# Patient Record
Sex: Female | Born: 1955 | Race: Black or African American | Hispanic: No | Marital: Single | Smoking: Never smoker
Health system: Southern US, Community
[De-identification: ages and names within clinical notes are randomized; demographics above are authoritative.]

## PROBLEM LIST (undated history)

## (undated) DIAGNOSIS — G4730 Sleep apnea, unspecified: Secondary | ICD-10-CM

## (undated) DIAGNOSIS — I10 Essential (primary) hypertension: Secondary | ICD-10-CM

## (undated) DIAGNOSIS — E785 Hyperlipidemia, unspecified: Secondary | ICD-10-CM

## (undated) DIAGNOSIS — E882 Lipomatosis, not elsewhere classified: Secondary | ICD-10-CM

## (undated) DIAGNOSIS — E049 Nontoxic goiter, unspecified: Secondary | ICD-10-CM

## (undated) DIAGNOSIS — K219 Gastro-esophageal reflux disease without esophagitis: Secondary | ICD-10-CM

## (undated) DIAGNOSIS — R011 Cardiac murmur, unspecified: Secondary | ICD-10-CM

## (undated) DIAGNOSIS — M109 Gout, unspecified: Secondary | ICD-10-CM

## (undated) DIAGNOSIS — D649 Anemia, unspecified: Secondary | ICD-10-CM

## (undated) DIAGNOSIS — N189 Chronic kidney disease, unspecified: Secondary | ICD-10-CM

## (undated) DIAGNOSIS — M1990 Unspecified osteoarthritis, unspecified site: Secondary | ICD-10-CM

## (undated) DIAGNOSIS — N186 End stage renal disease: Secondary | ICD-10-CM

## (undated) DIAGNOSIS — K900 Celiac disease: Secondary | ICD-10-CM

## (undated) DIAGNOSIS — J309 Allergic rhinitis, unspecified: Secondary | ICD-10-CM

## (undated) DIAGNOSIS — I679 Cerebrovascular disease, unspecified: Secondary | ICD-10-CM

## (undated) DIAGNOSIS — J302 Other seasonal allergic rhinitis: Secondary | ICD-10-CM

## (undated) HISTORY — PX: TONSILLECTOMY: SUR1361

## (undated) HISTORY — DX: Anemia, unspecified: D64.9

## (undated) HISTORY — DX: Gout, unspecified: M10.9

## (undated) HISTORY — DX: Unspecified osteoarthritis, unspecified site: M19.90

## (undated) HISTORY — DX: Allergic rhinitis, unspecified: J30.9

## (undated) HISTORY — DX: Cardiac murmur, unspecified: R01.1

## (undated) HISTORY — DX: Nontoxic goiter, unspecified: E04.9

## (undated) HISTORY — DX: Lipomatosis, not elsewhere classified: E88.2

## (undated) HISTORY — DX: Cerebrovascular disease, unspecified: I67.9

## (undated) HISTORY — DX: End stage renal disease: N18.6

## (undated) HISTORY — DX: Celiac disease: K90.0

---

## 1997-09-15 ENCOUNTER — Emergency Department (HOSPITAL_COMMUNITY): Admission: EM | Admit: 1997-09-15 | Discharge: 1997-09-15 | Payer: Self-pay | Admitting: Emergency Medicine

## 1997-10-12 ENCOUNTER — Encounter: Admission: RE | Admit: 1997-10-12 | Discharge: 1998-01-10 | Payer: Self-pay | Admitting: Internal Medicine

## 1998-01-06 ENCOUNTER — Encounter: Payer: Self-pay | Admitting: Endocrinology

## 1998-01-06 ENCOUNTER — Ambulatory Visit (HOSPITAL_COMMUNITY): Admission: RE | Admit: 1998-01-06 | Discharge: 1998-01-06 | Payer: Self-pay | Admitting: Endocrinology

## 1998-01-31 ENCOUNTER — Ambulatory Visit (HOSPITAL_BASED_OUTPATIENT_CLINIC_OR_DEPARTMENT_OTHER): Admission: RE | Admit: 1998-01-31 | Discharge: 1998-01-31 | Payer: Self-pay | Admitting: Orthopedic Surgery

## 1998-03-28 ENCOUNTER — Ambulatory Visit (HOSPITAL_BASED_OUTPATIENT_CLINIC_OR_DEPARTMENT_OTHER): Admission: RE | Admit: 1998-03-28 | Discharge: 1998-03-28 | Payer: Self-pay | Admitting: Orthopedic Surgery

## 1998-06-06 ENCOUNTER — Other Ambulatory Visit: Admission: RE | Admit: 1998-06-06 | Discharge: 1998-06-06 | Payer: Self-pay | Admitting: *Deleted

## 1998-09-21 ENCOUNTER — Emergency Department (HOSPITAL_COMMUNITY): Admission: EM | Admit: 1998-09-21 | Discharge: 1998-09-21 | Payer: Self-pay | Admitting: Emergency Medicine

## 1999-01-24 ENCOUNTER — Other Ambulatory Visit: Admission: RE | Admit: 1999-01-24 | Discharge: 1999-01-24 | Payer: Self-pay | Admitting: *Deleted

## 1999-01-29 ENCOUNTER — Ambulatory Visit (HOSPITAL_COMMUNITY): Admission: RE | Admit: 1999-01-29 | Discharge: 1999-01-29 | Payer: Self-pay | Admitting: *Deleted

## 1999-05-02 ENCOUNTER — Encounter (HOSPITAL_BASED_OUTPATIENT_CLINIC_OR_DEPARTMENT_OTHER): Payer: Self-pay | Admitting: Internal Medicine

## 1999-05-02 ENCOUNTER — Encounter: Admission: RE | Admit: 1999-05-02 | Discharge: 1999-07-31 | Payer: Self-pay | Admitting: Internal Medicine

## 1999-07-31 ENCOUNTER — Encounter: Admission: RE | Admit: 1999-07-31 | Discharge: 1999-10-29 | Payer: Self-pay | Admitting: *Deleted

## 1999-08-02 ENCOUNTER — Ambulatory Visit (HOSPITAL_COMMUNITY): Admission: RE | Admit: 1999-08-02 | Discharge: 1999-08-02 | Payer: Self-pay | Admitting: Podiatry

## 1999-08-02 ENCOUNTER — Encounter: Payer: Self-pay | Admitting: Podiatry

## 1999-12-17 ENCOUNTER — Encounter: Admission: RE | Admit: 1999-12-17 | Discharge: 2000-03-16 | Payer: Self-pay | Admitting: Orthopedic Surgery

## 2000-03-20 ENCOUNTER — Ambulatory Visit (HOSPITAL_COMMUNITY): Admission: RE | Admit: 2000-03-20 | Discharge: 2000-03-20 | Payer: Self-pay | Admitting: Endocrinology

## 2000-04-03 ENCOUNTER — Encounter: Payer: Self-pay | Admitting: Orthopedic Surgery

## 2000-04-03 ENCOUNTER — Ambulatory Visit (HOSPITAL_COMMUNITY): Admission: RE | Admit: 2000-04-03 | Discharge: 2000-04-03 | Payer: Self-pay | Admitting: Orthopedic Surgery

## 2000-07-01 ENCOUNTER — Ambulatory Visit (HOSPITAL_BASED_OUTPATIENT_CLINIC_OR_DEPARTMENT_OTHER): Admission: RE | Admit: 2000-07-01 | Discharge: 2000-07-01 | Payer: Self-pay | Admitting: Orthopedic Surgery

## 2000-09-03 ENCOUNTER — Other Ambulatory Visit: Admission: RE | Admit: 2000-09-03 | Discharge: 2000-09-03 | Payer: Self-pay | Admitting: *Deleted

## 2000-09-10 ENCOUNTER — Ambulatory Visit (HOSPITAL_COMMUNITY): Admission: RE | Admit: 2000-09-10 | Discharge: 2000-09-10 | Payer: Self-pay | Admitting: Endocrinology

## 2000-11-04 ENCOUNTER — Emergency Department (HOSPITAL_COMMUNITY): Admission: EM | Admit: 2000-11-04 | Discharge: 2000-11-05 | Payer: Self-pay

## 2000-12-01 ENCOUNTER — Emergency Department (HOSPITAL_COMMUNITY): Admission: EM | Admit: 2000-12-01 | Discharge: 2000-12-01 | Payer: Self-pay | Admitting: Emergency Medicine

## 2001-01-28 ENCOUNTER — Encounter: Payer: Self-pay | Admitting: Emergency Medicine

## 2001-01-28 ENCOUNTER — Inpatient Hospital Stay (HOSPITAL_COMMUNITY): Admission: EM | Admit: 2001-01-28 | Discharge: 2001-01-29 | Payer: Self-pay | Admitting: Emergency Medicine

## 2001-05-12 ENCOUNTER — Encounter (HOSPITAL_BASED_OUTPATIENT_CLINIC_OR_DEPARTMENT_OTHER): Admission: RE | Admit: 2001-05-12 | Discharge: 2001-05-15 | Payer: Self-pay | Admitting: Orthopedic Surgery

## 2001-08-17 ENCOUNTER — Encounter (HOSPITAL_BASED_OUTPATIENT_CLINIC_OR_DEPARTMENT_OTHER): Admission: RE | Admit: 2001-08-17 | Discharge: 2001-11-15 | Payer: Self-pay | Admitting: Internal Medicine

## 2001-11-10 ENCOUNTER — Other Ambulatory Visit: Admission: RE | Admit: 2001-11-10 | Discharge: 2001-11-10 | Payer: Self-pay | Admitting: *Deleted

## 2001-12-03 ENCOUNTER — Ambulatory Visit (HOSPITAL_COMMUNITY): Admission: RE | Admit: 2001-12-03 | Discharge: 2001-12-03 | Payer: Self-pay | Admitting: *Deleted

## 2002-01-12 ENCOUNTER — Encounter: Payer: Self-pay | Admitting: Emergency Medicine

## 2002-01-12 ENCOUNTER — Emergency Department (HOSPITAL_COMMUNITY): Admission: EM | Admit: 2002-01-12 | Discharge: 2002-01-12 | Payer: Self-pay | Admitting: Emergency Medicine

## 2002-02-21 HISTORY — PX: KNEE ARTHROSCOPY: SHX127

## 2002-08-12 ENCOUNTER — Encounter: Payer: Self-pay | Admitting: Orthopedic Surgery

## 2002-08-12 ENCOUNTER — Ambulatory Visit (HOSPITAL_COMMUNITY): Admission: RE | Admit: 2002-08-12 | Discharge: 2002-08-12 | Payer: Self-pay | Admitting: Orthopedic Surgery

## 2002-09-10 ENCOUNTER — Ambulatory Visit (HOSPITAL_BASED_OUTPATIENT_CLINIC_OR_DEPARTMENT_OTHER): Admission: RE | Admit: 2002-09-10 | Discharge: 2002-09-10 | Payer: Self-pay | Admitting: Orthopedic Surgery

## 2003-01-12 ENCOUNTER — Ambulatory Visit (HOSPITAL_COMMUNITY): Admission: RE | Admit: 2003-01-12 | Discharge: 2003-01-12 | Payer: Self-pay | Admitting: *Deleted

## 2003-02-15 ENCOUNTER — Ambulatory Visit: Admission: RE | Admit: 2003-02-15 | Discharge: 2003-02-15 | Payer: Self-pay | Admitting: Endocrinology

## 2003-03-01 ENCOUNTER — Ambulatory Visit: Admission: RE | Admit: 2003-03-01 | Discharge: 2003-03-01 | Payer: Self-pay | Admitting: Endocrinology

## 2003-03-03 ENCOUNTER — Encounter (HOSPITAL_BASED_OUTPATIENT_CLINIC_OR_DEPARTMENT_OTHER): Admission: RE | Admit: 2003-03-03 | Discharge: 2003-03-10 | Payer: Self-pay | Admitting: Internal Medicine

## 2003-06-14 ENCOUNTER — Emergency Department (HOSPITAL_COMMUNITY): Admission: EM | Admit: 2003-06-14 | Discharge: 2003-06-15 | Payer: Self-pay | Admitting: Emergency Medicine

## 2003-06-22 ENCOUNTER — Ambulatory Visit (HOSPITAL_COMMUNITY): Admission: RE | Admit: 2003-06-22 | Discharge: 2003-06-22 | Payer: Self-pay | Admitting: Emergency Medicine

## 2003-06-23 ENCOUNTER — Other Ambulatory Visit: Admission: RE | Admit: 2003-06-23 | Discharge: 2003-06-23 | Payer: Self-pay | Admitting: *Deleted

## 2003-09-14 ENCOUNTER — Ambulatory Visit (HOSPITAL_COMMUNITY): Admission: RE | Admit: 2003-09-14 | Discharge: 2003-09-14 | Payer: Self-pay | Admitting: Orthopedic Surgery

## 2003-09-19 ENCOUNTER — Encounter (HOSPITAL_BASED_OUTPATIENT_CLINIC_OR_DEPARTMENT_OTHER): Admission: RE | Admit: 2003-09-19 | Discharge: 2003-09-23 | Payer: Self-pay | Admitting: Internal Medicine

## 2003-10-27 ENCOUNTER — Encounter (HOSPITAL_BASED_OUTPATIENT_CLINIC_OR_DEPARTMENT_OTHER): Admission: RE | Admit: 2003-10-27 | Discharge: 2003-11-22 | Payer: Self-pay | Admitting: Internal Medicine

## 2003-11-02 ENCOUNTER — Encounter: Admission: RE | Admit: 2003-11-02 | Discharge: 2003-11-02 | Payer: Self-pay | Admitting: Internal Medicine

## 2004-01-22 HISTORY — PX: TRIGGER FINGER RELEASE: SHX641

## 2004-02-15 ENCOUNTER — Ambulatory Visit (HOSPITAL_COMMUNITY): Admission: RE | Admit: 2004-02-15 | Discharge: 2004-02-15 | Payer: Self-pay | Admitting: *Deleted

## 2004-12-11 ENCOUNTER — Ambulatory Visit (HOSPITAL_COMMUNITY): Admission: RE | Admit: 2004-12-11 | Discharge: 2004-12-11 | Payer: Self-pay | Admitting: Orthopedic Surgery

## 2004-12-11 ENCOUNTER — Ambulatory Visit (HOSPITAL_BASED_OUTPATIENT_CLINIC_OR_DEPARTMENT_OTHER): Admission: RE | Admit: 2004-12-11 | Discharge: 2004-12-11 | Payer: Self-pay | Admitting: Orthopedic Surgery

## 2005-01-31 ENCOUNTER — Ambulatory Visit (HOSPITAL_COMMUNITY): Admission: RE | Admit: 2005-01-31 | Discharge: 2005-01-31 | Payer: Self-pay | Admitting: Endocrinology

## 2005-02-27 ENCOUNTER — Ambulatory Visit (HOSPITAL_COMMUNITY): Admission: RE | Admit: 2005-02-27 | Discharge: 2005-02-27 | Payer: Self-pay | Admitting: *Deleted

## 2005-03-04 ENCOUNTER — Encounter: Admission: RE | Admit: 2005-03-04 | Discharge: 2005-06-02 | Payer: Self-pay | Admitting: Endocrinology

## 2005-05-28 IMAGING — CR DG ANKLE COMPLETE 3+V*R*
2 series · 2 of 2 positions shown · non-contrast
Comparison: none

CLINICAL DATA: Deformity.  Evaluate Charcot joint. 
 RIGHT ANKLE 
 Three views of the right ankle were obtained.  No acute bony abnormality is seen.  The ankle joint appears normal.  There is a large degenerative calcaneal spur present with degenerative changes in the midfoot. 
 IMPRESSION
 No acute abnormality.  Degenerative changes as noted above.

[view not recorded (1 of 2)]
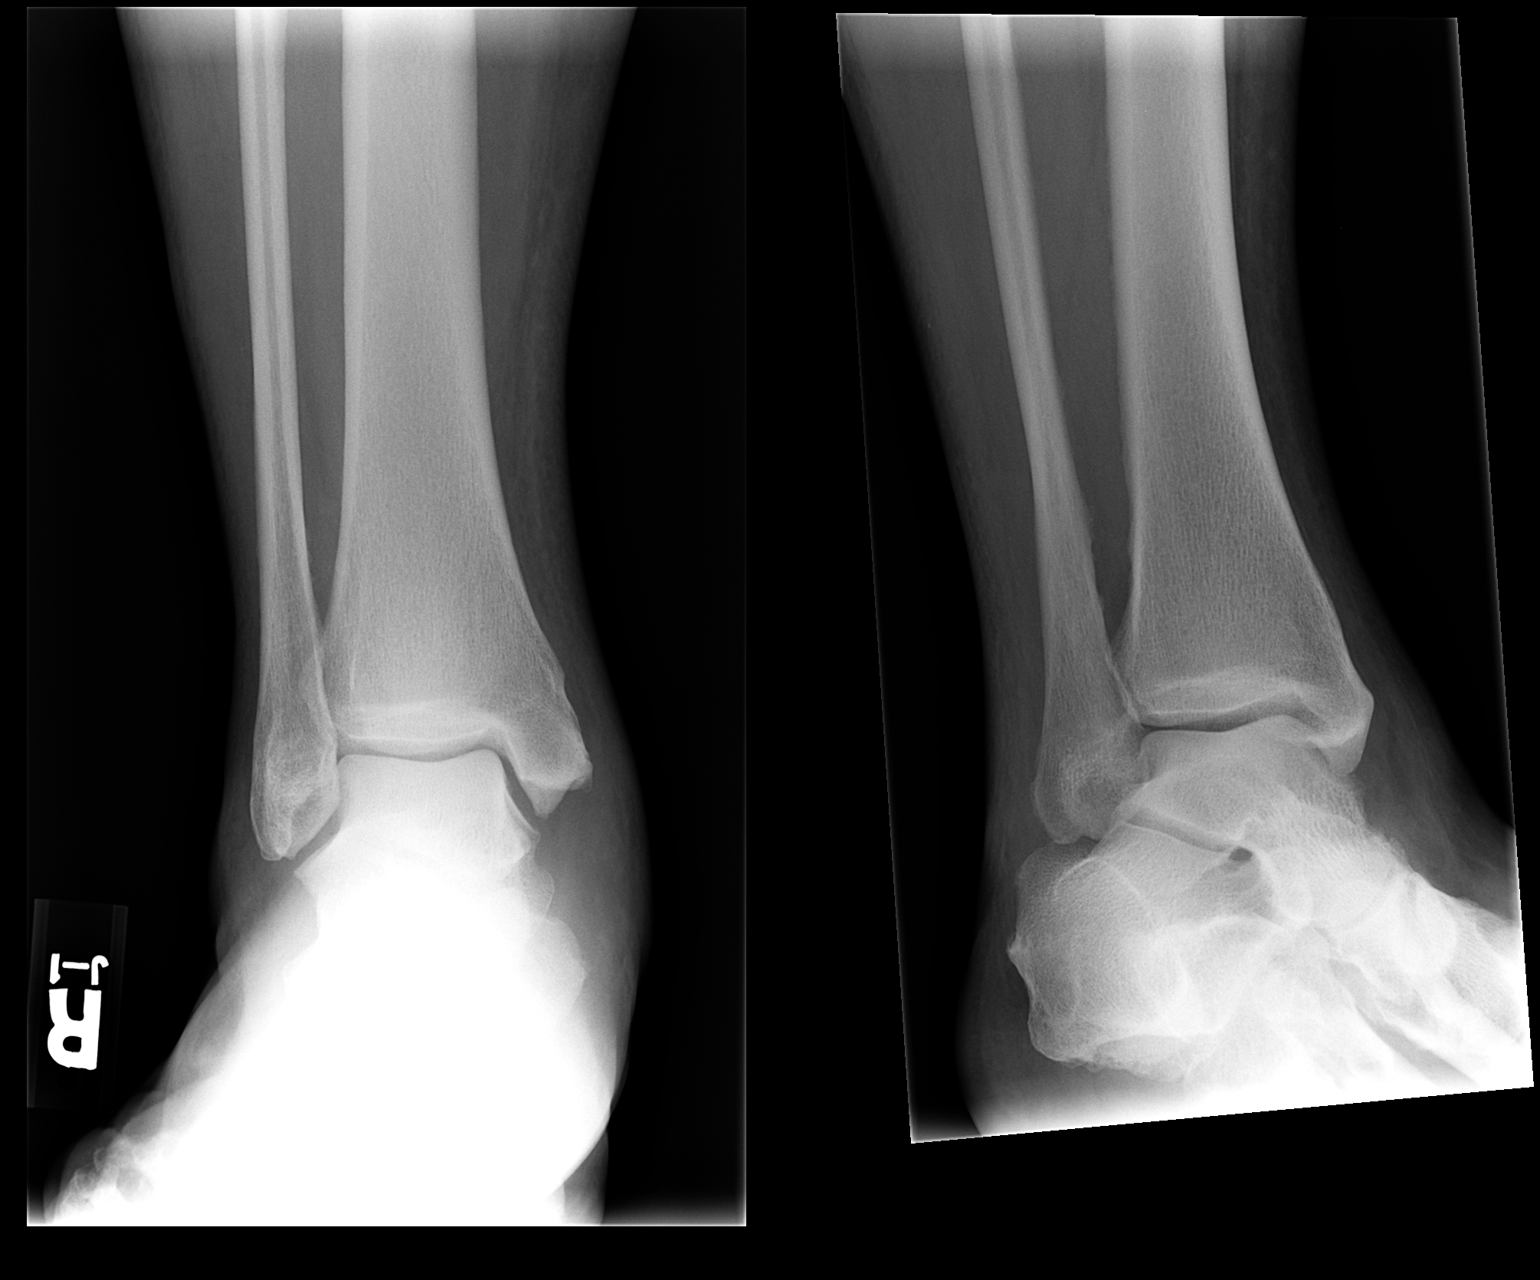

[view not recorded (2 of 2)]
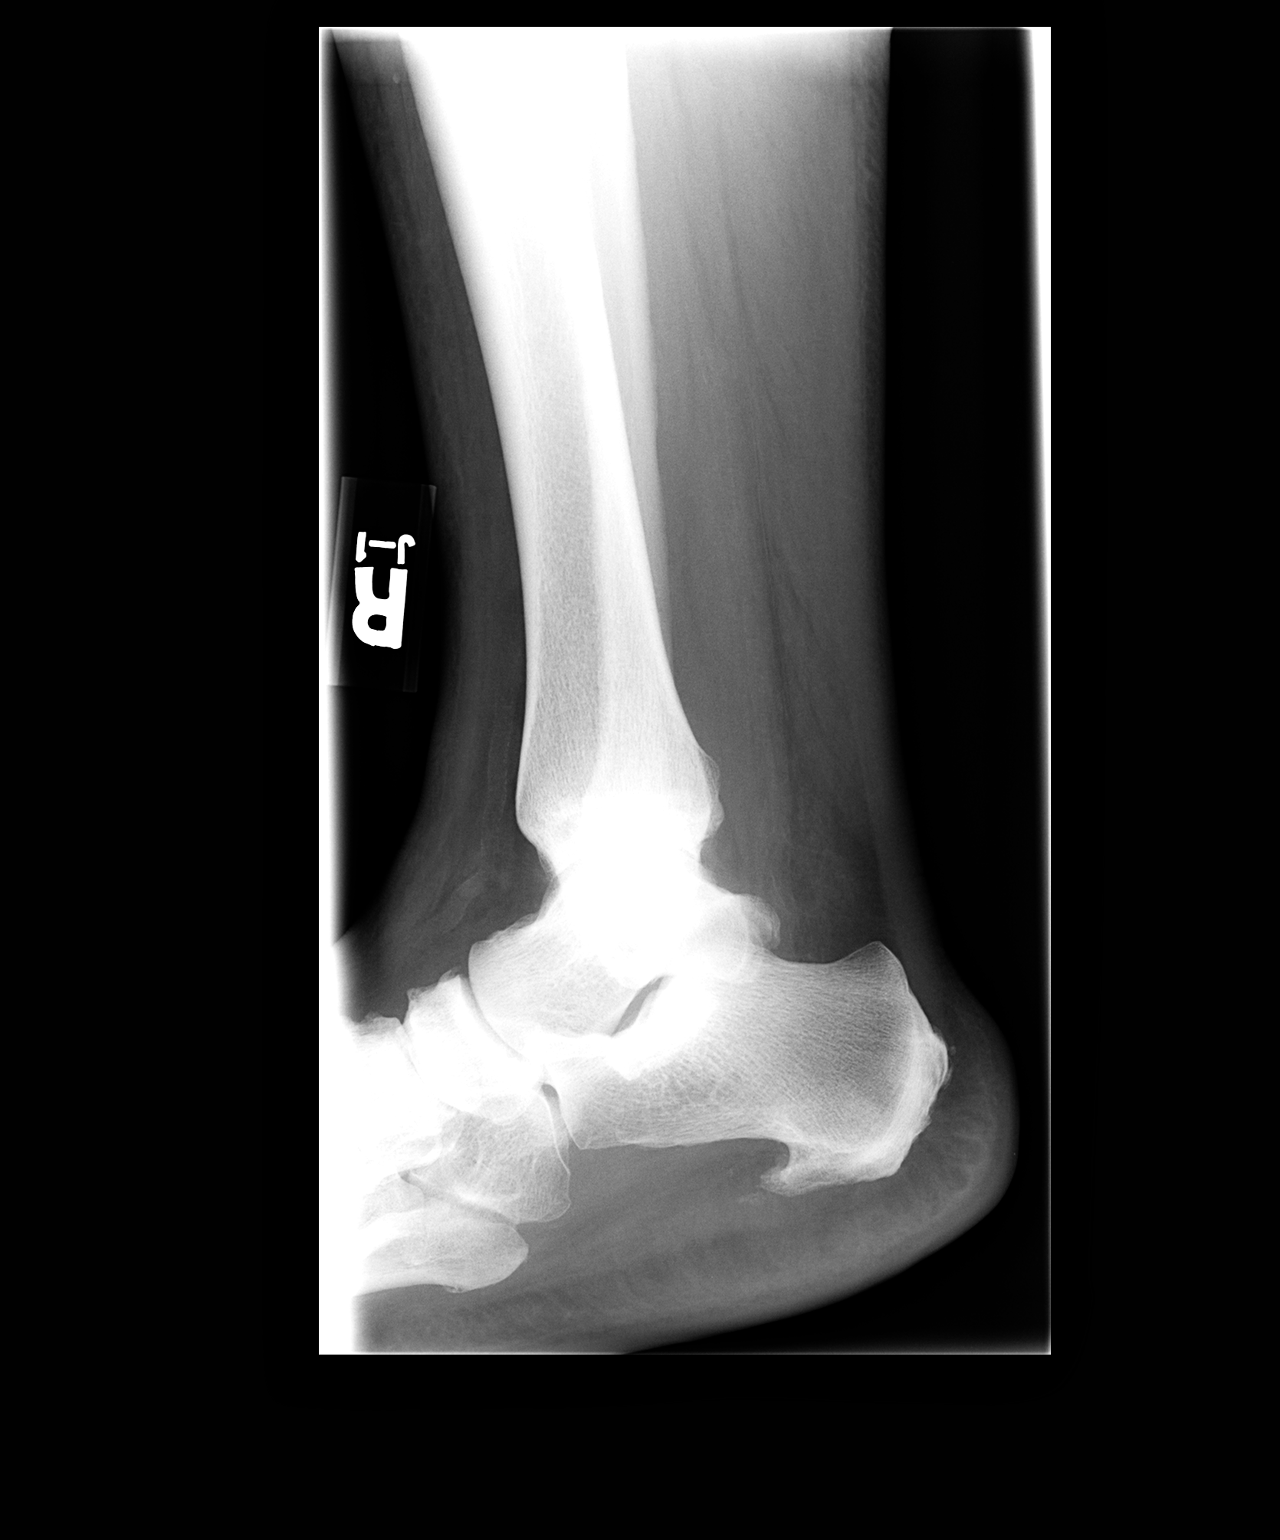

[2 of 2 positions shown; findings below may reference images not displayed]

## 2005-07-10 ENCOUNTER — Encounter: Admission: RE | Admit: 2005-07-10 | Discharge: 2005-08-07 | Payer: Self-pay | Admitting: Orthopedic Surgery

## 2006-02-28 ENCOUNTER — Ambulatory Visit (HOSPITAL_COMMUNITY): Admission: RE | Admit: 2006-02-28 | Discharge: 2006-02-28 | Payer: Self-pay | Admitting: *Deleted

## 2007-01-22 HISTORY — PX: DORSAL COMPARTMENT RELEASE: SHX1474

## 2007-03-19 ENCOUNTER — Ambulatory Visit (HOSPITAL_COMMUNITY): Admission: RE | Admit: 2007-03-19 | Discharge: 2007-03-19 | Payer: Self-pay | Admitting: Obstetrics and Gynecology

## 2007-07-28 ENCOUNTER — Ambulatory Visit (HOSPITAL_BASED_OUTPATIENT_CLINIC_OR_DEPARTMENT_OTHER): Admission: RE | Admit: 2007-07-28 | Discharge: 2007-07-28 | Payer: Self-pay | Admitting: Orthopedic Surgery

## 2008-04-12 ENCOUNTER — Ambulatory Visit (HOSPITAL_COMMUNITY): Admission: RE | Admit: 2008-04-12 | Discharge: 2008-04-12 | Payer: Self-pay | Admitting: Obstetrics

## 2009-01-18 DIAGNOSIS — I10 Essential (primary) hypertension: Secondary | ICD-10-CM | POA: Insufficient documentation

## 2009-01-18 DIAGNOSIS — D649 Anemia, unspecified: Secondary | ICD-10-CM | POA: Insufficient documentation

## 2009-05-20 ENCOUNTER — Emergency Department (HOSPITAL_COMMUNITY): Admission: EM | Admit: 2009-05-20 | Discharge: 2009-05-20 | Payer: Self-pay | Admitting: Emergency Medicine

## 2009-06-07 DIAGNOSIS — E1121 Type 2 diabetes mellitus with diabetic nephropathy: Secondary | ICD-10-CM | POA: Insufficient documentation

## 2009-12-15 ENCOUNTER — Encounter: Admission: RE | Admit: 2009-12-15 | Discharge: 2009-12-15 | Payer: Self-pay | Admitting: Orthopedic Surgery

## 2010-02-02 DIAGNOSIS — F321 Major depressive disorder, single episode, moderate: Secondary | ICD-10-CM | POA: Insufficient documentation

## 2010-06-05 NOTE — Op Note (Signed)
NAME:  Chelsey Lee, Chelsey Lee              ACCOUNT NO.:  [id number]   MEDICAL RECORD NO.:  [id number]          PATIENT TYPE:  AMB   LOCATION:  [facility]                          FACILITY:  [facility]   PHYSICIAN:  Youlanda Mighty. Sypher, M.D. DATE OF BIRTH:  1955-04-25   DATE OF PROCEDURE:  07/28/2007  DATE OF DISCHARGE:                               OPERATIVE REPORT   PREOPERATIVE DIAGNOSIS:  Chronic stenosing tenosynovitis, left first  dorsal compartment and chronic stenosing tenosynovitis, right small  finger at A1 pulley, both related to insulin-dependent diabetes.   POSTOPERATIVE DIAGNOSES:  Chronic stenosing tenosynovitis, left first  dorsal compartment and chronic stenosing tenosynovitis, right small  finger at A1 pulley, both related to insulin-dependent diabetes.   OPERATION:  1. Release of left first dorsal compartment with identification of two      slips of abductor pollicis longus and a single slip of extensor      pollicis brevis.  No septum was identified.  2. Release of left small finger A1 pulley with resection of an      incidental ganglion forming at the A1 pulley.   OPERATING SURGEON:  Youlanda Mighty. Sypher, MD.   ASSISTANT:  Leverne Humbles, PA-C.   ANESTHESIA:  General by endotracheal technique.   SUPERVISING ANESTHESIOLOGIST:  Dr. Finis Bud.   INDICATIONS:  Chelsey Lee is a 55 year old woman referred through the  courtesy of Dr. Carrolyn Meiers for evaluation of chronic stenosing  tenosynovitis.  She has a chronic insulin-dependent diabetes managed by  insulin and oral agents.  She has had a history of prior stenosing  tenosynovitis requiring release of A1 pulleys.  She has not responded to  nonoperative measures with progressive stenosing tenosynovitis of the  left first dorsal compartment and stenosing tenosynovitis of the right  small finger.   She requested surgical release of the affected tenosynovial sheaths.   After informed consent, she was brought to the operating  room at this  time.   Preoperatively, she was interviewed by Dr. Finis Bud of  anesthesia.  General anesthesia by LMA technique was recommended and  accepted by Chelsey Lee.   She was brought to the operating room at this time.   PROCEDURE:  Chelsey Lee was brought to the operating room and placed  in supine position on the operating table.   Following the induction of general anesthesia by LMA technique, there  was a concern about the security of the airway.  Therefore, Dr. Oletta Lamas  proceeded with general endotracheal technique.  Once anesthesia was  adequately achieved, the right and left arms were prepped with Betadine  soap solution and sterilely draped.  We anticipate use of an Esmarch  bandage on the right at proximal forearm level and a pneumatic  tourniquet was applied to proximal brachium.   Following exsanguination of the right arm with the Esmarch bandage, the  Esmarch was left on the proximal forearm as a tourniquet.  The procedure  commenced with placement of hand in a lead hand to allow access to the  flexor sheaths.  An oblique incision was fashioned directly over the  palpably thickened A1  pulley of the left small finger.  Subcutaneous  tissues were carefully divided taking care to identify and retract the  neurovascular structures.  The A1 pulley was isolated and found to have  a 4-mm in diameter ganglion cyst.  The cyst was removed piecemeal with a  rongeur followed by release of the pulley.  The tendons were delivered  and synovium removed with a rongeur.   Thereafter free range of motion of the small fingers recovered.   This wound was then repaired with intradermal 3-0 Prolene and Steri-  Strips.  Lidocaine 2% was infiltrated for postoperative analgesia.  The  tourniquet was released and a dressing applied with sterile gauze and  Ace wrap.  Attention was then directed to the left arm.  The left arm  was exsanguinated with an Esmarch bandage and  arterial tourniquet  inflated to 240 mmHg.  The procedure commenced with a transverse  incision directly over the palpably thickened first dorsal compartment.  Subcutaneous tissues were carefully divided taking care to identify and  gently retract the superficial sensory branches.  The first dorsal  compartment was isolated and found to be quite edematous and fibrotic.  After multiple blunt retractors were placed, the A1 pulley was split  with scalpel and scissors.  Three tendons were noted.  There were two  slips of the abductor pollicis longus and a single slip of the extensor  pollicis brevis.  I could not identify a septum.   The tendons were delivered and found to have inflammatory tenosynovium.  There was no sign of tendon damage.   The wound was then repaired with intradermal 3-0 Prolene and Steri-  Strips and a dressing of sterile gauze and a Tegaderm applied.  An Ace  wrap was applied over the wrist and proximal forearm for compression.   Lidocaine 2% was infiltrated for postop analgesia followed by release of  tourniquet.  Immediate capillary refill was noted in the fingers and  thumb.   Chelsey Lee tolerated both procedures well.  She was transferred to  recovery room with stable vital signs.      Youlanda Mighty Sypher, M.D.  Electronically Signed     RVS/MEDQ  D:  07/28/2007  T:  07/28/2007  Job:  [id number]   cc:   Annie Main A. Forde Dandy, M.D.

## 2010-06-08 NOTE — H&P (Signed)
[facility]  Patient:    Chelsey Lee, Chelsey Lee Visit Number: [id number] MRN: [id number]          Service Type: MED Location: 1E 0107 01 Attending Physician:  Nat Christen Dictated by:   Haywood Pao, M.D. Admit Date:  01/28/2001                           History and Physical  ADDENDUM TO DICTATION NUMBER [id number]  ASSESSMENT AND PLAN:  Type 2 diabetes mellitus.  We will hold her Glucophage and follow her with sliding scale for at least the next day until it has been decided whether she will undergo a contrasted study or not.  We will continue her diabetes regimen as normal. Dictated by:   Haywood Pao, M.D. Attending Physician:  Nat Christen DD:  01/28/01 TD:  01/28/01 Job: [id number]

## 2010-06-08 NOTE — Op Note (Signed)
[facility]  Patient:    Chelsey Lee, Chelsey Lee                     MRN: [id number] Proc. Date: 07/01/00 Adm. Date:  [id number] Attending:  Barbaraann Cao                           Operative Report  PREOPERATIVE DIAGNOSIS: 1. Torn medial meniscus right knee. 2. Chondromalacia patellofemoral joint. 3. Medial compartment lateral compartment arthrosis. 4. Small lateral meniscus tear.  POSTOPERATIVE DIAGNOSIS: 1. Torn medial meniscus right knee. 2. Chondromalacia patellofemoral joint. 3. Medial compartment lateral compartment arthrosis. 4. Small lateral meniscus tear.  OPERATION PERFORMED: 1. Partial medial and lateral meniscectomies. 2. Debridement chondroplasty, patellofemoral joint, medial and lateral    compartments. 3. Arthroscopic lateral release.  SURGEON:  Josephine Cables., M.D.  ANESTHESIA:  General.  INDICATIONS FOR PROCEDURE:  The patient is a 55 year old MRI proven medial meniscus tear felt to be amenable to outpatient arthroscopy.  DESCRIPTION OF PROCEDURE:  Arthroscope through inferomedial, inferolateral, superomedial portal.  Systematic inspection the knee showed the patient to have mild patellofemoral arthrosis, overhanging patella requiring release with arthroscopic cautery.  There was medial compartment arthrosis of grade 2 and 3 variety, rather generalized but very early grade 3 changes.  Complex tear of the posterior horn of the medial meniscus requiring resection of 20 to 30% of the meniscus substance.  There was actually some meniscus left posteriorly that was not torn.  The lateral meniscus showed a small fraying type tear requiring resection of 5 to 10% of the meniscus, fibrillation moderate softening of the joint requiring mild debridement although the arthritic changes were more of a softening of both sides of the joint on this side. Knee drained of clear fluid.  Portal infiltrated with Marcaine morphine  with addition of 40 mg Depo-Medrol in the joint.  Lateral release site infiltrated .  Taken to recovery in stable condition. DD:  07/01/00 TD:  07/01/00 Job: [id number]

## 2010-06-08 NOTE — H&P (Signed)
[facility]  Patient:    Chelsey Lee, Chelsey Lee Visit Number: [id number] MRN: [id number]          Service Type: MED Location: 1E 0107 01 Attending Physician:  Nat Christen Dictated by:   Haywood Pao, M.D. Admit Date:  01/28/2001   CC:         Annie Main A. Forde Dandy, M.D.   History and Physical  DATE OF BIRTH:  10/07/1955.  CHIEF COMPLAINT:  Chest pain.  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  She indicated that last night she had some pain in her chest substernally following a meal.  She tried burping and taking some Gas-X, may have helped it, but it somewhat persisted and went away on its own.  Had another episode of it tonight not immediately associated with a meal, this time radiating under her right breast.  She became concerned and thought something might be going on with her heart and walked over to the emergency room to have an evaluation.  Was given two sublingual nitroglycerin, which seemed to help the pain and noted the Gas-X did not help relieve her pain.  She indicated that she was feeling tired working, and she felt an episode of her heart racing and burning under her right breast.  The pain, she indicates, is better with sitting down.  The patient states that she does not have any cardiac history and last had a cardiac evaluation with treadmill in the mid-1990s.  Has not stated any other recent health problems at this point.  ALLERGIES:  No known drug allergies.  MEDICATIONS:  Cardura 4 mg p.o. q.d., Glucotrol XL 5 mg p.o. q.d., Xanax has been discontinued per the patient, Lotrel 5/20 mg p.o. q.d., Glucophage 500 mg one tablet with breakfast, two at lunch, two at dinner.  Prilosec has been discontinued by the patient.  NPH 35 units subcu q.a.m., and Ortho-Prefest.  PAST  MEDICAL HISTORY: 1. Diabetes mellitus.  The patient was diagnosed approximately 25 years ago. 2. Hypertension. 3. Gastroesophageal reflux disease, off her current medicines. 4. Anxiety, off her current medicines. 5. Hiatal hernia.  PAST SURGICAL HISTORY:  The patient has had a hand surgery, a partial hysterectomy in 1998, and arthroscopic surgery on her right knee in 2002.  SOCIAL HISTORY:  The patient lives in [place] with her parents.  She is a housekeeper at [employer] and is a nonsmoker, nondrinker, but a self-admitted "food-a-holic."  Denies any herbal medicine usage or any other drugs.  FAMILY HISTORY:  Her mother is alive with hypertension.  Her father is alive with hypertension and diabetes with _____.  She has one brother and one sister, both of whom are reasonably healthy.  REVIEW OF SYSTEMS:  The patient complains only of fatigue now.  She complained of chest pain earlier.  Denies fevers, chills, sweats, any shortness of breath or dyspnea on exertion.  No new cough or wheezing is noted.  No urinary symptoms and no specific GI symptoms other than some possible reflux.  All other systems are negative.  ADVANCE DIRECTIVES:  The patient is a full code.  PHYSICAL EXAMINATION:  VITAL SIGNS:  Temperature 97.0, pulse 93, respiratory rate 22, blood pressure 133/68.  Pain is 0/10 currently and saturating 98% on room air.  GENERAL:  The patient is obese, pleasant, in no acute distress.  HEENT:  Normocephalic, atraumatic.  PERRLA, EOMI.  ENT is within normal limits.  Dentition shows multiple fillings.  NECK:  Supple without lymphadenopathy, thyromegaly, or bruit.  CHEST:  Clear to auscultation bilaterally.  SKIN:  No rashes or lesions.  CARDIAC:  Regular rate and rhythm, no murmur, rub, or gallop.  Normal S1, S2. Pulses are 2+ and equal bilaterally.  ABDOMEN:  Soft, nontender, normoactive bowel sounds.  No rebound or guarding. No hepatosplenomegaly.   Positive for obesity.  EXTREMITIES:  The patient has 1+ pitting edema of the right leg, but she states it has persisted x6 months following arthroscopic knee surgery.  She has had evaluation for DVT, which has been negative, and wears stockings normally.  MUSCULOSKELETAL:  No joint deformity, effusions, or tenderness.  No thigh or CVA tenderness.  NEUROLOGIC:   Cranial nerves II-XII are intact.  The patient is oriented x3. Strength is 5/5 bilaterally.  DIAGNOSTIC STUDIES:  Chest x-ray shows mild cardiac enlargement and pulmonary venous hypertension.  No evidence of congestive heart failure.  EKG:  Rate is 88 with normal sinus rhythm and occasional PACs.  Axis is normal.  Her PR is 160, QRS 80, with a QTC of 448.  She has poor R-wave progression V1-V6, final turnover at V5.  No hypertrophy is noted.  White count 7.7, hemoglobin 10.3, BUN and creatinine 21 and 1.1, respectively, potassium 3.5, sodium 135.  Total bilirubin 0.8, AST 22, ALT 15, alkaline phosphatase 57, amylase 110, lipase 93.  CK 142, CK-MB 1.2, troponin 0.01.  ASSESSMENT AND PLAN: 1. Chest pain, will rule out myocardial infarction on telemetry with enzymes    q.6h. x2, complete for cycle.  The pain is likely GI or gastroesophageal    reflux disease-related.  I have reinitiated a proton pump inhibitor with    Protonix 40 mg p.o. q.d.  The patient is considered high-risk for heart    event given that she has had long-term diabetes and with her last    evaluation being sometime in the mid-1990s and symptomatology relieved by    nitroglycerin. 2. Gastroesophageal reflux disease.  Resume her proton pump inhibitor as    above. 3. Anxiety.  The patient has done well off her _____, will not resume at this    point. 4. Right lower extremity edema.  The patient has had prior evaluation for    deep vein thrombosis, which was negative, and states that the swelling has    been stable for the past six months and has compression  stockings.  4. Hypertension controlled on her medications. 5. Will check a lipid panel in the morning.  Her goal LDL is less than 100.    She does not know what her previous lipids were other than that they were    good. Dictated by:   Haywood Pao, M.D. Attending Physician:  Nat Christen DD:  01/28/01 TD:  01/28/01 Job: [id number]

## 2010-06-08 NOTE — Discharge Summary (Signed)
[facility]  Patient:    Chelsey Lee, Chelsey Lee Visit Number: [id number] MRN: [id number]          Service Type: MED Location: 3W [phone] Attending Physician:  Haywood Pao Dictated by:   Ishmael Holter Forde Dandy, M.D. Admit Date:  01/28/2001 Discharge Date: 01/29/2001                             Discharge Summary  DISCHARGE DIAGNOSES: 1. Chest pain with normal cardiac enzymes and normal cardiac catheterization. 2. Type 2 diabetes. 3. Hypertension. 4. Peripheral neuropathy. 5. Gastroesophageal reflux.  HOSPITAL COURSE:  Ms. Wills is a 55 year old black female, who has been under my care for several years for treatment of her type 2 diabetes and hypertension.  She has a prior history of gastroesophageal reflux and has actually been off her Prilosec for one month or so.  She presented to my partner on the 8th of January at 3:30 in the morning with chest pain and was admitted for rule out MI protocol.  Due to her multiple risk factors, cardiology consult was obtained, and the patient underwent a cardiac cath this afternoon with finding of normal coronaries and normal left ventricular function.  The patient has now completed her bed rest time, has been up without difficulty, and no local wound problems are noted in the groin.  Her blood sugars have done moderately well during her hospital stay here.  Blood pressure has been well-controlled in general.  Most recent blood sugar should be noted as 131.  The patient has had no further chest pain.  Room air oxygen saturations have been 98%, and there has been no evidence of pulmonary emboli. Her chest pain has resolved with reinstitution of her proton pump inhibitor. Blood pressure has been at target at 131/69.  She is now discharged home in improved condition with diet to be no concentrated sweets, no added salt as before.  DISCHARGE MEDICATIONS:  She is to resume her prior medications. 1. Cardura 4 mg  daily. 2. Glucotrol XL 5 daily. 3. Lotrel 5/20 daily. 4. Glucophage 500 with breakfast, 2 at lunch, and 2 at dinner.  This will    start on the 11th of January. 5. Prilosec 20 mg will be reinstituted. 6. NPH 35 units daily. 7. Ortho-Prefest will be restarted.  LABORATORY REVIEW:  White count 9700, hemoglobin 10.5, MCV 84.3, platelets 247,000, INR 1.1, PTT 27.  Sodium 135, potassium 3.5, chloride 106, CO2 22, BUN 21, creatinine 1.1, glucose 90, total bilirubin .8, alkaline phosphatase 57, SGOT 22, SGPT 15, total protein 6.8, albumin 3.2, calcium 8.5.  Amylase 110, lipase 33.  CKs were 118 and 103.  Troponins were 0.01 and less than 0.01.  Lipids showed a total cholesterol of 155, triglycerides 133, HDL 56, LDL 72, VLDL 27, total cholesterol to HDL ratio 2.8.  A chest x-ray shows cardiomegaly with questionable vascular congestion with some loss of volume in the right base.  Coronary cath is not final, but normal coronaries and normal LV function is the written note.  In summary, a 55 year old black female with cardiac risk factors including diabetes, hypertension, and obesity, who presented with chest pain and underwent cardiac cath after an MI was ruled out.  Fortunately, her coronary arteries and left ventricular function were perfectly normal, and it is felt that the source of her pain was likely GI.  Her diabetes has been stable, and she is discharged home in  improved condition with the details as noted above. Follow up will be with me as a regular scheduled appointment or if she has further chest pain, she is to call.  She is to watch her sugars carefully when she is off her Glucophage. Dictated by:   Ishmael Holter Forde Dandy, M.D. Attending Physician:  Haywood Pao DD:  01/29/98 TD:  01/30/01 Job: [id number]

## 2010-06-08 NOTE — Op Note (Signed)
   NAME:  Chelsey Lee, PEAN NO.:  [id number]   MEDICAL RECORD NO.:  [id number]                   PATIENT TYPE:  AMB   LOCATION:  [facility]                                  FACILITY:  [facility]   PHYSICIAN:  Yvette Rack., M.D.             DATE OF BIRTH:  1956/01/12   DATE OF PROCEDURE:  09/10/2002  DATE OF DISCHARGE:                                 OPERATIVE REPORT   INDICATIONS FOR PROCEDURE:  The patient is a 55 year old with MRI proven  left knee torn meniscus, thought to be amenable to outpatient arthroscopy.   PREOPERATIVE DIAGNOSES:  1. Complex tear posterior  horn medial meniscus, left knee.  2. Grade 3 chondromalacia patellofemoral joint  and medial compartment.  3. Grade 2 chondromalacia lateral femur.   POSTOPERATIVE DIAGNOSES:  1. Complex tear posterior  horn medial meniscus, left knee.  2. Grade 3 chondromalacia patellofemoral joint  and medial compartment.  3. Grade 2 chondromalacia lateral femur.   OPERATION:  1. Partial lateral and medial meniscectomy (30% to 40%).  2. Debridement chondroplasty patellofemoral joint, medial and lateral     compartments.   SURGEON:  Lockie Pares, M.D.   ANESTHESIA:  General.   DESCRIPTION OF PROCEDURE:  She was arthroscoped through inferomedial and  inferolateral portals. Systematic inspection of the knee showed a thick  medial plica with mild to moderate changes of grade 3 chondromalacia  patellofemoral joint and debrided. The lateral  compartment showed grade 2  changes on the femur by and large sparing the  tibia with no tear noted on  the lateral side.   On the medial side appeared a complex tear of the posterior  and medial  meniscus which required resection with 30% to 40% of meniscal substance with  an additional generalized more advanced grade 3 changes, more noticeable on  the probably 50% of the weightbearing surface of the femur and corresponding  surfaces on the tibia; less severe changes on  the tibia than the  femur.  These were aggressively debrided. Again the patellofemoral joint  and  lateral  compartment were debrided extensively separate from the medial  compartment.   Medial meniscectomy, the knee was drained free of fluid. The portals were  closed with nylon. The knee was infiltrated with Marcaine, morphine and an  additional 40 mg of Depo-Medrol. A light compressive dressing was applied.  The patient was taken to the recovery room in stable condition.                                                 Yvette Rack., M.D.    WDC/MEDQ  D:  09/10/2002  T:  09/11/2002  Job:  [id number]

## 2010-06-08 NOTE — Consult Note (Signed)
[facility]  Patient:    KIMANI, PING Visit Number: [id number] MRN: [id number]          Service Type: Attending:  Marcello Moores A. Mare Ferrari, M.D. Dictated by:   Joyice Faster Mare Ferrari, M.D. Proc. Date: 01/28/01   CC:         Haywood Pao, M.D.  Ishmael Holter. Forde Dandy, M.D.   Consultation Report  CHIEF COMPLAINT:  Chest pain.  HISTORY:  This 55 year old black female who is an employee here at [employer] was admitted because of chest pain and dyspnea not relieved by Gas-X, but relieved by sublingual nitroglycerin.  She does have multiple risk factors for coronary disease including diabetes mellitus insulin-dependent and high blood pressure as well as hyperlipidemia.  She began having onset of chest discomfort on the evening of January 26, 2001 while at work.  Pain was not severe and she did not seek medical attention at that time.  She rested the next day and did well but last night back at work she again had substernal chest discomfort which was worse with exertion.  It was not associated with any diaphoresis, nausea, or vomiting.  There was no radiation to the arms. The patient tried some Gas-X with no relief, but did get relief with sublingual nitroglycerin after arrival in the emergency room.  Her initial cardiac work-up shows only nonspecific T-wave changes and premature atrial beats and her initial set of cardiac enzymes are negative.  FAMILY HISTORY:  Her parents are living.  Her father has diabetes and high blood pressure.  There is no history of premature coronary disease in the family.  SOCIAL HISTORY:  She does not use any alcohol or tobacco.  She is a Secretary/administrator at [employer].  PAST MEDICAL HISTORY:  She has had a history of a hiatal hernia.  PAST SURGICAL HISTORY:  Hysterectomy in 1998.  REVIEW OF SYSTEMS:  She has not been having any constitutional symptoms except for fatigue.  She has had exertional dyspnea.  She has had chronic  edema of the right leg ever since she had a knee arthroscopy on the right knee a year ago.  GENITOURINARY:  Negative.  GASTROINTESTINAL:  She has a past history of hiatal hernia with some symptoms consistent with GERD.  Remainder of review of systems is negative in detail.  PHYSICAL EXAMINATION  VITAL SIGNS:  Blood pressure 110/60, pulse 70 and regular, respirations 16. She estimates that her weight is about 250 pounds.  GENERAL:  Large woman in no acute distress.  SKIN:  Clear.  HEENT:  Pupils are equal, round and reactive to light and accommodation. Sclerae:  Clear.  Mouth and pharynx are normal.  Tongue is normal.  NECK:  Carotids have a normal upstroke.  There are no carotid bruits.  Jugular venous pressure is normal.  Thyroid is not enlarged or tender.  No lymphadenopathy.  CHEST:  Clear to percussion and auscultation.  HEART:  Quiet precordium without murmur, gallop, rub, or click.  There is no chest wall tenderness.  ABDOMEN:  Soft, nontender.  No masses.  Liver, spleen are not enlarged.  EXTREMITIES:  She does have chronic edema of the right foot.  She has good pedal pulses bilaterally.  LABORATORIES:  Electrocardiogram shows nonspecific ST-T wave abnormalities and sinus rhythm.  Chest x-ray shows borderline cardiac enlargement.  Laboratory studies are unremarkable so far.  IMPRESSION: 1. Chest pain, rule out myocardial infarction, rule out new onset of angina    pectoris. 2. Hypertensive cardiovascular disease.  3. Insulin-dependent diabetes mellitus. 4. Exogenous obesity.  RECOMMENDATION:  Agree with the admission orders as written.  In addition, will put her on Lovenox 1 mg/kg subcutaneously b.i.d.  I have recommended that the patient undergo cardiac catheterization to establish or refute the diagnosis of coronary disease and to evaluate future therapies.  We will set up the cardiac catheterization for tomorrow with Dr. Peter Martinique.  Because of her obesity  and her sedentary lifestyle, it is unlikely that we would be able to obtain a non-invasive study which would answer the questions that need to be asked in terms of her coronary disease. Dictated by:   Joyice Faster Mare Ferrari, M.D. Attending:  Joyice Faster Mare Ferrari, M.D. DD:  01/28/01 TD:  01/28/01 Job: [id number]

## 2010-06-08 NOTE — Op Note (Signed)
NAME:  Chelsey Lee, Chelsey Lee              ACCOUNT NO.:  [id number]   MEDICAL RECORD NO.:  [id number]          PATIENT TYPE:  AMB   LOCATION:  [place]                          FACILITY:  [place]   PHYSICIAN:  Youlanda Mighty. Sypher, M.D. DATE OF BIRTH:  [birth year]   DATE OF PROCEDURE:  12/11/2004  DATE OF DISCHARGE:                                 OPERATIVE REPORT   PREOPERATIVE DIAGNOSES:  1.  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  2.  Left thumb carpometacarpal joint arthritis.   POSTOPERATIVE DIAGNOSES:  1.  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  2.  Left thumb carpometacarpal joint arthritis.   OPERATION:  1.  Release of right thumb A1 pulley.  2.  Injection of left thumb carpometacarpal joint with Depo-Medrol and 1%      lidocaine and 0.25% Marcaine, all without epinephrine.   OPERATING SURGEON:  Youlanda Mighty. Sypher, M.D.   ASSISTANT:  Leverne Humbles, PA-C.   ANESTHESIA:  General sedation and local anesthesia at metacarpal head level  of right thumb, supervising anesthesiologist Dr.  Albertina Parr.   INDICATIONS:  Chelsey Lee is a 55 year old employee at [employer], who presents for evaluation of a painful right thumb.  She has been noted have chronic stenosing synovitis due to background type 2  diabetes.  She has not responded to nonoperative measures and is not eager  to proceed with steroid injection at this time.   She requests release of her A1 pulley.   She is also noted have bilateral thumb CMC arthrosis, left worse than right.  She has had increasing pain at the base of her left thumb.  Therefore, we  recommend proceeding with injection of her left thumb CMC joint at this time  while sedated.   PROCEDURE:  Chelsey Lee was brought to the operating room and placed in  supine position on the operating table.  Following light sedation, 0.25%  Marcaine and 2% lidocaine were infiltrated at the metacarpal head level on  the  right to obtain a digital block.  When anesthesia was satisfactory, the  arm was prepped with Betadine soap and solution and sterilely draped.   The eye was exsanguinated with an Esmarch bandage and an arterial tourniquet  on the proximal brachium placed to 230 mmHg.   The procedure commenced with a short transverse incision directly over the  palpably thickened A1 pulley.  The subcutaneous tissues were carefully  divided, taking care to gently retract the radial proper digital nerve and  adipose tissues.  The A1 pulley was isolated, split with scalpel and  scissors.   The flexor tendon was delivered and found to have a rather sizable swelling.  Thereafter,full range of motion of the IP joint was recovered.   The wound was repaired with mattress suture of 5-0 nylon.   Attention directed to the left thumb.  The [facility] joint was palpated by motion,  identifying the capsule.  A 27-gauge needle was used to inject a mixture of  1 mL of Depo-Medrol 40 mg/mL and 0.5 mL of Marcaine and lidocaine.   There  were no apparent complications.   [name] tolerated the surgery and anesthesia well.  She was placed in a  compressive dressing with Ace wrap and transferred to the recovery room with  stable vital signs.      Youlanda Mighty Sypher, M.D.  Electronically Signed     RVS/MEDQ  D:  12/11/2004  T:  12/11/2004  Job:  [id number]

## 2010-06-08 NOTE — Cardiovascular Report (Signed)
[facility]  Patient:    Chelsey Lee, Chelsey Lee Visit Number: [id number] MRN: [id number]          Service Type: MED Location: 3W [phone] Attending Physician:  Haywood Pao Dictated by:   Peter M. Martinique, M.D. Proc. Date: 01/29/01 Admit Date:  01/28/2001   CC:         Annie Main A. Forde Dandy, M.D.  Thomas A. Mare Ferrari, M.D.   Cardiac Catheterization  INDICATIONS FOR PROCEDURE:  The patient is a 55 year old black female who presents with prolonged substernal chest pain.  She has a longstanding history of hypertension and diabetes.  She also has a history of morbid obesity.  ACCESS:  Via the right femoral artery using standard Seldinger technique.  EQUIPMENT:  The #6 French 4-cm right and left Judkins catheters, a #6 French pigtail catheter, a #6 French arterial sheath.  MEDICATIONS:  Local anesthesia of 1% Xylocaine.  CONTRAST:  Omnipaque, 100 cc.  HEMODYNAMIC DATA:  Aortic pressure was 140/80 with a mean of 107.  Left ventricular pressure was 133 with an EDP of 22 mmHg.  ANGIOGRAPHIC DATA: 1. The left coronary artery arises and distributes normally. 2. The left main coronary artery is normal. 3. The left anterior descending artery and its branches are normal. 4. The left circumflex coronary artery is normal. 5. The right coronary artery arises and distributes normally and is a normal    vessel.  Left ventricular angiography performed in the RAO view demonstrates normal left ventricular size with normal systolic function.  Ejection fraction is estimated at 60%.  There is no mitral regurgitation or prolapse.  FINAL INTERPRETATION: 1. Normal coronary anatomy. 2. Normal left ventricular function. Dictated by:   Peter M. Martinique, M.D. Attending Physician:  Haywood Pao DD:  01/29/01 TD:  01/29/01 Job: [id number]

## 2010-10-18 LAB — POCT I-STAT, CHEM 8
BUN: 24 — ABNORMAL HIGH
Calcium, Ion: 1.25
Chloride: 104
Creatinine, Ser: 1.2
Glucose, Bld: 145 — ABNORMAL HIGH
HCT: 37
Hemoglobin: 12.6
Potassium: 4.1
Sodium: 136
TCO2: 25

## 2011-07-08 ENCOUNTER — Other Ambulatory Visit (HOSPITAL_COMMUNITY): Payer: Self-pay | Admitting: Endocrinology

## 2011-07-08 DIAGNOSIS — Z1231 Encounter for screening mammogram for malignant neoplasm of breast: Secondary | ICD-10-CM

## 2011-07-30 ENCOUNTER — Other Ambulatory Visit: Payer: Self-pay | Admitting: Orthopedic Surgery

## 2011-07-31 ENCOUNTER — Ambulatory Visit (HOSPITAL_COMMUNITY)
Admission: RE | Admit: 2011-07-31 | Discharge: 2011-07-31 | Disposition: A | Discharge status: Home / Self Care | Payer: BC Managed Care – PPO | Source: Ambulatory Visit | Attending: Endocrinology | Admitting: Endocrinology

## 2011-07-31 ENCOUNTER — Encounter (HOSPITAL_BASED_OUTPATIENT_CLINIC_OR_DEPARTMENT_OTHER): Payer: Self-pay | Admitting: *Deleted

## 2011-07-31 ENCOUNTER — Encounter (HOSPITAL_BASED_OUTPATIENT_CLINIC_OR_DEPARTMENT_OTHER)
Admission: RE | Admit: 2011-07-31 | Discharge: 2011-07-31 | Disposition: A | Discharge status: Home / Self Care | Payer: BC Managed Care – PPO | Source: Ambulatory Visit | Attending: Orthopedic Surgery | Admitting: Orthopedic Surgery

## 2011-07-31 DIAGNOSIS — Z1231 Encounter for screening mammogram for malignant neoplasm of breast: Secondary | ICD-10-CM | POA: Insufficient documentation

## 2011-07-31 LAB — BASIC METABOLIC PANEL
CO2: 24 mEq/L (ref 19–32)
Calcium: 9.8 mg/dL (ref 8.4–10.5)
Chloride: 101 mEq/L (ref 96–112)
Creatinine, Ser: 1.33 mg/dL — ABNORMAL HIGH (ref 0.50–1.10)
Glucose, Bld: 215 mg/dL — ABNORMAL HIGH (ref 70–99)
Sodium: 135 mEq/L (ref 135–145)

## 2011-07-31 NOTE — Progress Notes (Signed)
Pt to come by for ekg-bmet Has been her for other hand surgeries Denies any sleep apnea-resp problems Diabetic

## 2011-07-31 NOTE — H&P (Signed)
Chelsey Lee is an 56 y.o. female.   Chief Complaint: c/o chronic and progressive triggering of the left thumb HPI:  Chelsey Lee is now 56 years of age.  She is right-hand dominant. She works as a Sports coach at [employer].  She is on summer break.  She has had a three week history of locking of her left thumb. We have treated her for multiple stenosing tenosynovitis predicaments including trigger fingers and stenosing tenosynovitis of her left first dorsal compartment.   She has done very well. She is followed by Chelsey Lee for insulin dependent diabetes.    Past Medical History  Diagnosis Date  . Hypertension   . Seasonal allergies   . Hyperlipemia   . Diabetes mellitus   . GERD (gastroesophageal reflux disease)   . Arthritis     Past Surgical History  Procedure Date  . Dorsal compartment release 2009    rt   . Trigger finger release 2006    rt thumb  . Knee arthroscopy 02,04    right and left    No family history on file. Social History:  reports that she has never smoked. She does not have any smokeless tobacco history on file. She reports that she does not drink alcohol or use illicit drugs.  Allergies: No Known Allergies  No prescriptions prior to admission    Results for orders placed during the hospital encounter of 08/01/11 (from the past 48 hour(s))  BASIC METABOLIC PANEL     Status: Abnormal   Collection Time   07/31/11 12:30 PM      Component Value Range Comment   Sodium 135  135 - 145 mEq/L    Potassium 4.4  3.5 - 5.1 mEq/L    Chloride 101  96 - 112 mEq/L    CO2 24  19 - 32 mEq/L    Glucose, Bld 215 (*) 70 - 99 mg/dL    BUN 25 (*) 6 - 23 mg/dL    Creatinine, Ser 1.33 (*) 0.50 - 1.10 mg/dL    Calcium 9.8  8.4 - 10.5 mg/dL    GFR calc non Af Amer 44 (*) >90 mL/min    GFR calc Af Amer 51 (*) >90 mL/min     No results found.   Pertinent items are noted in HPI.  Height 5\' 7"  (1.702 m), weight 127.007 kg (280 lb).  General appearance:  alert Head: Normocephalic, without obvious abnormality Neck: supple, symmetrical, trachea midline Resp: clear to auscultation bilaterally Cardio: regular rate and rhythm GI: normal findings: bowel sounds normal Extremities:   Inspection of her hands reveals shouldering of her left thumb CMC joint.  She has pain with CMC translation and grind on the left. She has active locking of her left thumb IP joint with flexion and extension.  She is tender over the A-1 pulley. She has full range of motion of her left fingers, right fingers, right thumb and no signs of right first dorsal compartment stenosing tenosynovitis.   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  RECOMMENDATIONS/PLAN:   She is advised to proceed with release of the left thumb A-1 pulley and injection of the left thumb CMC joint under anesthesia. She is very apprehensive and resistant to the thought of an injection in the office.    Questions regarding her predicament were invited and answered in detail.  DASNOIT,Chelsey Lee 07/31/2011, 4:08 PM    H&P documentation: 08/01/2011  -History and Physical Reviewed  -Patient has been re-examined  -No change in the plan of care  Cammie Sickle, MD

## 2011-07-31 NOTE — Progress Notes (Signed)
ekg reviewed and cleared by dr fitzgerald

## 2011-08-01 ENCOUNTER — Encounter (HOSPITAL_BASED_OUTPATIENT_CLINIC_OR_DEPARTMENT_OTHER)
Admission: RE | Disposition: A | Discharge status: Home / Self Care | Payer: Self-pay | Source: Ambulatory Visit | Attending: Orthopedic Surgery

## 2011-08-01 ENCOUNTER — Encounter (HOSPITAL_BASED_OUTPATIENT_CLINIC_OR_DEPARTMENT_OTHER): Payer: Self-pay | Admitting: *Deleted

## 2011-08-01 ENCOUNTER — Ambulatory Visit (HOSPITAL_BASED_OUTPATIENT_CLINIC_OR_DEPARTMENT_OTHER)
Admission: RE | Admit: 2011-08-01 | Discharge: 2011-08-01 | Disposition: A | Discharge status: Home / Self Care | Payer: BC Managed Care – PPO | Source: Ambulatory Visit | Attending: Orthopedic Surgery | Admitting: Orthopedic Surgery

## 2011-08-01 ENCOUNTER — Encounter (HOSPITAL_BASED_OUTPATIENT_CLINIC_OR_DEPARTMENT_OTHER): Payer: Self-pay | Admitting: Certified Registered"

## 2011-08-01 ENCOUNTER — Encounter (HOSPITAL_BASED_OUTPATIENT_CLINIC_OR_DEPARTMENT_OTHER): Payer: Self-pay | Admitting: Anesthesiology

## 2011-08-01 ENCOUNTER — Ambulatory Visit (HOSPITAL_BASED_OUTPATIENT_CLINIC_OR_DEPARTMENT_OTHER): Payer: BC Managed Care – PPO | Admitting: Anesthesiology

## 2011-08-01 DIAGNOSIS — M19049 Primary osteoarthritis, unspecified hand: Secondary | ICD-10-CM | POA: Insufficient documentation

## 2011-08-01 DIAGNOSIS — E785 Hyperlipidemia, unspecified: Secondary | ICD-10-CM | POA: Insufficient documentation

## 2011-08-01 DIAGNOSIS — E119 Type 2 diabetes mellitus without complications: Secondary | ICD-10-CM | POA: Insufficient documentation

## 2011-08-01 DIAGNOSIS — Z01812 Encounter for preprocedural laboratory examination: Secondary | ICD-10-CM | POA: Insufficient documentation

## 2011-08-01 DIAGNOSIS — I10 Essential (primary) hypertension: Secondary | ICD-10-CM | POA: Insufficient documentation

## 2011-08-01 DIAGNOSIS — M65839 Other synovitis and tenosynovitis, unspecified forearm: Secondary | ICD-10-CM | POA: Insufficient documentation

## 2011-08-01 DIAGNOSIS — K219 Gastro-esophageal reflux disease without esophagitis: Secondary | ICD-10-CM | POA: Insufficient documentation

## 2011-08-01 DIAGNOSIS — M6530 Trigger finger, unspecified finger: Secondary | ICD-10-CM | POA: Insufficient documentation

## 2011-08-01 HISTORY — DX: Hyperlipidemia, unspecified: E78.5

## 2011-08-01 HISTORY — DX: Essential (primary) hypertension: I10

## 2011-08-01 HISTORY — DX: Unspecified osteoarthritis, unspecified site: M19.90

## 2011-08-01 HISTORY — DX: Gastro-esophageal reflux disease without esophagitis: K21.9

## 2011-08-01 HISTORY — DX: Other seasonal allergic rhinitis: J30.2

## 2011-08-01 HISTORY — PX: TRIGGER FINGER RELEASE: SHX641

## 2011-08-01 HISTORY — PX: STERIOD INJECTION: SHX5046

## 2011-08-01 LAB — GLUCOSE, CAPILLARY
Glucose-Capillary: 85 mg/dL (ref 70–99)
Glucose-Capillary: 91 mg/dL (ref 70–99)

## 2011-08-01 SURGERY — RELEASE, A1 PULLEY, FOR TRIGGER FINGER
Anesthesia: Monitor Anesthesia Care | Site: Hand | Laterality: Left | Wound class: Clean

## 2011-08-01 MED ORDER — DEXAMETHASONE SODIUM PHOSPHATE 4 MG/ML IJ SOLN
INTRAMUSCULAR | Status: DC | PRN
  Administered 2011-08-01: 4 mg via INTRAVENOUS

## 2011-08-01 MED ORDER — FENTANYL CITRATE 0.05 MG/ML IJ SOLN
INTRAMUSCULAR | Status: DC | PRN
  Administered 2011-08-01: 50 ug via INTRAVENOUS
  Administered 2011-08-01: 25 ug via INTRAVENOUS

## 2011-08-01 MED ORDER — OXYCODONE HCL 5 MG PO TABS: 5.0000 mg | ORAL_TABLET | Freq: Once | ORAL | Status: DC | PRN

## 2011-08-01 MED ORDER — ONDANSETRON HCL 4 MG/2ML IJ SOLN
INTRAMUSCULAR | Status: DC | PRN
  Administered 2011-08-01: 4 mg via INTRAVENOUS

## 2011-08-01 MED ORDER — METOCLOPRAMIDE HCL 5 MG/ML IJ SOLN: 10.0000 mg | Freq: Once | INTRAMUSCULAR | Status: DC | PRN

## 2011-08-01 MED ORDER — METHYLPREDNISOLONE ACETATE 40 MG/ML IJ SUSP
INTRAMUSCULAR | Status: DC | PRN
  Administered 2011-08-01: 20 mg via INTRA_ARTICULAR

## 2011-08-01 MED ORDER — FENTANYL CITRATE 0.05 MG/ML IJ SOLN
25.0000 ug | INTRAMUSCULAR | Status: DC | PRN
  Administered 2011-08-01: 50 ug via INTRAVENOUS

## 2011-08-01 MED ORDER — LIDOCAINE HCL (CARDIAC) 20 MG/ML IV SOLN
INTRAVENOUS | Status: DC | PRN
  Administered 2011-08-01: 50 mg via INTRAVENOUS

## 2011-08-01 MED ORDER — PROPOFOL 10 MG/ML IV EMUL
INTRAVENOUS | Status: DC | PRN
  Administered 2011-08-01: 90 ug/kg/min via INTRAVENOUS

## 2011-08-01 MED ORDER — HYDROCODONE-ACETAMINOPHEN 5-325 MG PO TABS
1.0000 | ORAL_TABLET | ORAL | Status: DC | PRN
  Administered 2011-08-01: 1 via ORAL

## 2011-08-01 MED ORDER — HYDROCODONE-ACETAMINOPHEN 5-325 MG PO TABS
ORAL_TABLET | ORAL | Status: AC
Start: 2011-08-01 — End: 2011-08-11

## 2011-08-01 MED ORDER — LACTATED RINGERS IV SOLN
INTRAVENOUS | Status: DC
  Administered 2011-08-01: 08:00:00 via INTRAVENOUS

## 2011-08-01 MED ORDER — MIDAZOLAM HCL 5 MG/5ML IJ SOLN
INTRAMUSCULAR | Status: DC | PRN
  Administered 2011-08-01: 0.5 mg via INTRAVENOUS
  Administered 2011-08-01: 1 mg via INTRAVENOUS

## 2011-08-01 MED ORDER — LIDOCAINE HCL 2 % IJ SOLN
INTRAMUSCULAR | Status: DC | PRN
  Administered 2011-08-01: 2 mL

## 2011-08-01 SURGICAL SUPPLY — 36 items
BANDAGE ADHESIVE 1X3 (GAUZE/BANDAGES/DRESSINGS) ×4 IMPLANT
BLADE SURG 15 STRL LF DISP TIS (BLADE) ×1 IMPLANT
BLADE SURG 15 STRL SS (BLADE) ×2
BNDG CMPR 9X4 STRL LF SNTH (GAUZE/BANDAGES/DRESSINGS)
BNDG CMPR MD 5X2 ELC HKLP STRL (GAUZE/BANDAGES/DRESSINGS) ×1
BNDG ELASTIC 2 VLCR STRL LF (GAUZE/BANDAGES/DRESSINGS) ×2 IMPLANT
BNDG ESMARK 4X9 LF (GAUZE/BANDAGES/DRESSINGS) IMPLANT
BRUSH SCRUB EZ PLAIN DRY (MISCELLANEOUS) ×2 IMPLANT
CLOTH BEACON ORANGE TIMEOUT ST (SAFETY) ×2 IMPLANT
CORDS BIPOLAR (ELECTRODE) ×2 IMPLANT
COVER MAYO STAND STRL (DRAPES) ×2 IMPLANT
COVER TABLE BACK 60X90 (DRAPES) ×2 IMPLANT
CUFF TOURNIQUET SINGLE 18IN (TOURNIQUET CUFF) ×2 IMPLANT
DECANTER SPIKE VIAL GLASS SM (MISCELLANEOUS) IMPLANT
DRAPE EXTREMITY T 121X128X90 (DRAPE) ×2 IMPLANT
DRAPE SURG 17X23 STRL (DRAPES) ×2 IMPLANT
GAUZE SPONGE 4X4 12PLY STRL LF (GAUZE/BANDAGES/DRESSINGS) ×4 IMPLANT
GAUZE XEROFORM 1X8 LF (GAUZE/BANDAGES/DRESSINGS) ×2 IMPLANT
GLOVE BIOGEL M STRL SZ7.5 (GLOVE) ×2 IMPLANT
GLOVE ORTHO TXT STRL SZ7.5 (GLOVE) ×2 IMPLANT
GOWN PREVENTION PLUS XLARGE (GOWN DISPOSABLE) ×2 IMPLANT
GOWN STRL REIN XL XLG (GOWN DISPOSABLE) ×4 IMPLANT
NEEDLE 27GAX1X1/2 (NEEDLE) IMPLANT
PACK BASIN DAY SURGERY FS (CUSTOM PROCEDURE TRAY) ×2 IMPLANT
PAD ALCOHOL SWAB (MISCELLANEOUS) ×2 IMPLANT
PAD CAST 4YDX4 CTTN HI CHSV (CAST SUPPLIES) ×1 IMPLANT
PADDING CAST COTTON 4X4 STRL (CAST SUPPLIES) ×2
SPONGE GAUZE 4X4 12PLY (GAUZE/BANDAGES/DRESSINGS) ×2 IMPLANT
STOCKINETTE 4X48 STRL (DRAPES) ×2 IMPLANT
STRIP CLOSURE SKIN 1/2X4 (GAUZE/BANDAGES/DRESSINGS) ×1 IMPLANT
SWABSTICK POVIDONE IODINE SNGL (MISCELLANEOUS) ×6 IMPLANT
SYR 3ML LL SCALE MARK (SYRINGE) ×2 IMPLANT
SYR CONTROL 10ML LL (SYRINGE) IMPLANT
TOWEL OR 17X24 6PK STRL BLUE (TOWEL DISPOSABLE) ×2 IMPLANT
UNDERPAD 30X30 INCONTINENT (UNDERPADS AND DIAPERS) ×2 IMPLANT
WATER STERILE IRR 1000ML POUR (IV SOLUTION) IMPLANT

## 2011-08-01 NOTE — Anesthesia Preprocedure Evaluation (Addendum)
Anesthesia Evaluation  Patient identified by MRN, date of birth, ID band Patient awake    Reviewed: Allergy & Precautions, H&P , NPO status , Patient's Chart, lab work & pertinent test results, reviewed documented beta blocker date and time   Airway Mallampati: II TM Distance: >3 FB Neck ROM: full    Dental   Pulmonary neg pulmonary ROS,          Cardiovascular hypertension, On Medications and On Home Beta Blockers     Neuro/Psych negative neurological ROS  negative psych ROS   GI/Hepatic Neg liver ROS, GERD-  Medicated and Controlled,  Endo/Other  Oral Hypoglycemic AgentsMorbid obesity  Renal/GU negative Renal ROS  negative genitourinary   Musculoskeletal   Abdominal   Peds  Hematology negative hematology ROS (+)   Anesthesia Other Findings See surgeon's H&P   Reproductive/Obstetrics negative OB ROS                           Anesthesia Physical Anesthesia Plan  ASA: III  Anesthesia Plan: MAC   Post-op Pain Management:    Induction: Intravenous  Airway Management Planned: Simple Face Mask  Additional Equipment:   Intra-op Plan:   Post-operative Plan:   Informed Consent: I have reviewed the patients History and Physical, chart, labs and discussed the procedure including the risks, benefits and alternatives for the proposed anesthesia with the patient or authorized representative who has indicated his/her understanding and acceptance.   Dental Advisory Given  Plan Discussed with: CRNA and Surgeon  Anesthesia Plan Comments:        Anesthesia Quick Evaluation

## 2011-08-01 NOTE — Brief Op Note (Signed)
08/01/2011  9:24 AM  PATIENT:  Chelsey Lee  56 y.o. female  PRE-OPERATIVE DIAGNOSIS:  trigger thumb on left, cmc djd left thumb  POST-OPERATIVE DIAGNOSIS:  trigger thumb on left, cmc djd left thumb  PROCEDURE:   RELEASE TRIGGER FINGER/A-1 PULLEY (Left) thumb STEROID INJECTION (Left) thumb cmc joint  SURGEON:  Surgeon(s) and Role:    * Cammie Sickle., MD - Primary  PHYSICIAN ASSISTANT:   ASSISTANTS: Kathyrn Sheriff.A-C   ANESTHESIA:   IV sedation  EBL:  Total I/O In: 700 [I.V.:700] Out: -   BLOOD ADMINISTERED:none  DRAINS: none   LOCAL MEDICATIONS USED:  LIDOCAINE   SPECIMEN:  No Specimen  DISPOSITION OF SPECIMEN:  N/A  COUNTS:  YES  TOURNIQUET:  * Missing tourniquet times found for documented tourniquets in log:  [id number] *  DICTATION: .Other Dictation: Dictation Number [id number]  PLAN OF CARE: Discharge to home after PACU  PATIENT DISPOSITION:  PACU - hemodynamically stable.

## 2011-08-01 NOTE — Op Note (Signed)
[facility]  SALISA, BANAHAN              ACCOUNT NO.:  [id number]  MEDICAL RECORD NO.:  [id number]  LOCATION:                                 FACILITY:  PHYSICIAN:  Youlanda Mighty. Sewell Pitner, M.D. DATE OF BIRTH:  [birth year]  DATE OF PROCEDURE:  08/01/2011 DATE OF DISCHARGE:                              OPERATIVE REPORT   PREOPERATIVE DIAGNOSES: 1. 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 2. Chronic left thumb carpometacarpal arthritis.  POSTOPERATIVE DIAGNOSES: 1. 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 2. Chronic left thumb carpometacarpal arthritis.  OPERATION: 1. Release of left thumb A1 pulley. 2. Injection of 20 mg of Depo-Medrol and 0.5 mL of 2% lidocaine     without epinephrine into the left thumb CMC joint from an anterior     approach.  OPERATING SURGEON:  Youlanda Mighty. Oluwanifemi Petitti, M.D.  ASSISTANT:  Marily Lente. Dasnoit, PA-C.  ANESTHESIA:  A 2% lidocaine field block and flexor sheath block of left thumb, supplemented by IV sedation.  SUPERVISING ANESTHESIOLOGIST:  Jessy Oto. Albertina Parr, M.D.  INDICATIONS:  Chelsey Lee is a 56 year old woman with insulin- dependent diabetes.  She has been a long-term patient of Dr. Reynold Bowen.  We have managed a number of diabetes-and tendonitis-related disorders with Ms. Zaya over the years.  She has had a chronic left trigger thumb and pain at the base of her left thumb.  Plain x-rays have revealed significant CMC arthritis.  Due to a failure to respond to nonoperative measures, she is brought to the operating at this time for release of her left thumb A1 pulley and injection of her left thumb carpometacarpal joint.  After informed consent, she is brought to the operating room at this time.  PROCEDURE:  Dawnel Biven was brought to room 1 of the [facility] and placed in supine position on the operating table.  Following a detailed anesthesia informed consent by Dr.  Albertina Parr, IV sedation and local was recommended and accepted.  In room 1 under Dr. Roslynn Amble direct supervision, IV sedation was provided followed by Betadine prep of the hand and wrist.  Following routine presurgical time-out, we proceeded to inject the left thumb CMC joint with a maneuver of 10 pounds traction, palpation of the carpometacarpal joint line and after Betadine prep, direct injection into the capsule of the [facility] joint with a total volume of 0.5 mL of Depo- Medrol 40 mg/mL and 0.5 mL of 2% lidocaine without epinephrine.  Good joint distention was achieved.  The hand and arm were then prepped with Betadine soap and solution and sterilely draped.  A pneumatic tourniquet on proximal brachium was set at 240 mmHg.  The left hand and arm were exsanguinated with an Esmarch bandage and the arterial tourniquet on the proximal left brachium was inflated to 240 mmHg.  After a reminder time-out for the thumb surgery, we proceeded with a short transverse incision at the proximal thumb flexion crease directly over the palpably thickened A1 pulley.  Ms. Lifton had significant fibrosis of the subcutaneous tissues.  This is a finding we often see in the presence of long-term diabetes.  The A1 pulley was isolated with the  use of scissors and Freer dissection followed by placement of 4 Ragnell retractors.  Ms. Bibbee had an unusually long A1 pulley that extended over approximately 12 mm well into the thenar eminence.  We meticulously retracted the neurovascular structures with a Ragnell retractor and released the entire A1 pulley. Thereafter, she demonstrated active range of motion of her thumb, which was full.  The wound was inspected for bleeding points and subsequently repaired with intradermal 3-0 Prolene suture.  A Steri-Strip was applied, followed by a compressive dressing of sterile gauze and Ace wrap.  For aftercare, we have encouraged Ms. Glander to begin immediate  active range of motion exercises.  We have also noted in her preop lab that her hemoglobin was 10.6.  We notified Ms. Delbert and her mother that she had a low hemoglobin and the nursing staff will provide a copy of her lab studies to provide for Dr. Forde Dandy, her primary care physician.  There were no apparent complications.     Youlanda Mighty Miraya Cudney, M.D.     RVS/MEDQ  D:  08/01/2011  T:  08/01/2011  Job:  [id number]

## 2011-08-01 NOTE — Anesthesia Postprocedure Evaluation (Signed)
Anesthesia Post Note  Patient: Chelsey Lee  Procedure(s) Performed: Procedure(s) (LRB): RELEASE TRIGGER FINGER/A-1 PULLEY (Left) STEROID INJECTION (Left)  Anesthesia type: MAC  Patient location: PACU  Post pain: Pain level controlled  Post assessment: Patient's Cardiovascular Status Stable  Last Vitals:  Filed Vitals:   08/01/11 1000  BP: 130/70  Pulse: 72  Temp:   Resp: 14    Post vital signs: Reviewed and stable  Level of consciousness: alert  Complications: No apparent anesthesia complications

## 2011-08-01 NOTE — Transfer of Care (Signed)
Immediate Anesthesia Transfer of Care Note  Patient: Chelsey Lee  Procedure(s) Performed: Procedure(s) (LRB): RELEASE TRIGGER FINGER/A-1 PULLEY (Left) STEROID INJECTION (Left)  Patient Location: PACU  Anesthesia Type: General  Level of Consciousness: awake, alert , oriented and patient cooperative  Airway & Oxygen Therapy: Patient Spontanous Breathing and Patient connected to face mask oxygen  Post-op Assessment: Report given to PACU RN and Post -op Vital signs reviewed and stable  Post vital signs: Reviewed and stable  Complications: No apparent anesthesia complications

## 2011-08-01 NOTE — Progress Notes (Signed)
Dr. Girard Cooter notified of glucose of 85. D5W 211ml bag added to iv infuse 100cc as ordered by Dr. Girard Cooter.

## 2011-08-01 NOTE — Op Note (Signed)
[id number] 

## 2011-08-01 NOTE — Anesthesia Procedure Notes (Addendum)
Date/Time: 08/01/2011 8:58 AM Performed by: Denny Levy   Procedure Name: MAC Date/Time: 08/01/2011 8:58 AM Performed by: Denny Levy Pre-anesthesia Checklist: Patient identified, Timeout performed, Emergency Drugs available, Suction available and Patient being monitored Patient Re-evaluated:Patient Re-evaluated prior to inductionOxygen Delivery Method: Simple face mask Placement Confirmation: positive ETCO2

## 2011-08-02 ENCOUNTER — Encounter (HOSPITAL_BASED_OUTPATIENT_CLINIC_OR_DEPARTMENT_OTHER): Payer: Self-pay | Admitting: Orthopedic Surgery

## 2011-08-02 LAB — POCT HEMOGLOBIN-HEMACUE: Hemoglobin: 10.6 g/dL — ABNORMAL LOW (ref 12.0–15.0)

## 2012-01-09 ENCOUNTER — Encounter (HOSPITAL_COMMUNITY): Payer: Self-pay | Admitting: Pharmacy Technician

## 2012-01-13 ENCOUNTER — Ambulatory Visit (HOSPITAL_COMMUNITY)
Admission: RE | Admit: 2012-01-13 | Discharge: 2012-01-13 | Disposition: A | Discharge status: Home / Self Care | Payer: BC Managed Care – PPO | Source: Ambulatory Visit | Attending: Ophthalmology | Admitting: Ophthalmology

## 2012-01-13 ENCOUNTER — Encounter (HOSPITAL_COMMUNITY)
Admission: RE | Disposition: A | Discharge status: Home / Self Care | Payer: Self-pay | Source: Ambulatory Visit | Attending: Ophthalmology

## 2012-01-13 ENCOUNTER — Other Ambulatory Visit: Payer: Self-pay | Admitting: Ophthalmology

## 2012-01-13 DIAGNOSIS — H2640 Unspecified secondary cataract: Secondary | ICD-10-CM | POA: Insufficient documentation

## 2012-01-13 HISTORY — PX: YAG LASER APPLICATION: SHX6189

## 2012-01-13 HISTORY — PX: CAPSULOTOMY: SHX5412

## 2012-01-13 SURGERY — MINOR CAPSULOTOMY
Anesthesia: LOCAL | Site: Eye | Laterality: Left

## 2012-01-13 MED ORDER — APRACLONIDINE HCL 0.5 % OP SOLN: 1.0000 [drp] | Freq: Once | OPHTHALMIC | Status: DC

## 2012-01-13 MED ORDER — TROPICAMIDE 1 % OP SOLN
OPHTHALMIC | Status: AC
  Administered 2012-01-13: 1 [drp]
  Filled 2012-01-13: qty 3

## 2012-01-13 MED ORDER — TROPICAMIDE 1 % OP SOLN
2.0000 [drp] | OPHTHALMIC | Status: DC | PRN
  Administered 2012-01-13 (×2): 1 [drp] via OPHTHALMIC

## 2012-01-13 MED ORDER — APRACLONIDINE HCL 0.5 % OP SOLN
OPHTHALMIC | Status: AC
  Filled 2012-01-13: qty 5

## 2012-01-13 MED ORDER — APRACLONIDINE HCL 0.5 % OP SOLN
1.0000 [drp] | Freq: Once | OPHTHALMIC | Status: AC
  Administered 2012-01-13 (×2): 1 [drp] via OPHTHALMIC

## 2012-01-13 SURGICAL SUPPLY — 28 items
APPLICATOR COTTON TIP 6IN STRL (MISCELLANEOUS) ×2 IMPLANT
BAG FLD CLT MN 6.25X3.5 (WOUND CARE)
BAG MINI COLL DRAIN (WOUND CARE) IMPLANT
BLADE KERATOME 2.75 (BLADE) ×2 IMPLANT
BLADE MINI RND TIP GREEN BEAV (BLADE) IMPLANT
CLOTH BEACON ORANGE TIMEOUT ST (SAFETY) ×2 IMPLANT
CORDS BIPOLAR (ELECTRODE) ×2 IMPLANT
DRAPE OPHTHALMIC 40X48 W POUCH (DRAPES) ×2 IMPLANT
DRAPE RETRACTOR (MISCELLANEOUS) ×2 IMPLANT
GLOVE ECLIPSE 7.0 STRL STRAW (GLOVE) ×2 IMPLANT
GOWN STRL NON-REIN LRG LVL3 (GOWN DISPOSABLE) ×4 IMPLANT
KIT BASIN OR (CUSTOM PROCEDURE TRAY) ×2 IMPLANT
KIT ROOM TURNOVER OR (KITS) ×2 IMPLANT
KNIFE CRESCENT 2.5 55 ANG (BLADE) ×2 IMPLANT
MARKER SKIN DUAL TIP RULER LAB (MISCELLANEOUS) IMPLANT
NS IRRIG 1000ML POUR BTL (IV SOLUTION) ×2 IMPLANT
PACK CATARACT CUSTOM (CUSTOM PROCEDURE TRAY) ×2 IMPLANT
PACK CATARACT MCHSCP (PACKS) IMPLANT
PAD ARMBOARD 7.5X6 YLW CONV (MISCELLANEOUS) ×4 IMPLANT
PROBE ANTERIOR 20G W/INFUS NDL (MISCELLANEOUS) IMPLANT
SPEAR EYE SURG WECK-CEL (MISCELLANEOUS) IMPLANT
SUT ETHILON 10 0 CS140 6 (SUTURE) IMPLANT
SUT VICRYL 8 0 TG140 8 (SUTURE) IMPLANT
SYR 3ML LL SCALE MARK (SYRINGE) IMPLANT
TIP SILICONE STR (MISCELLANEOUS)
TIP SILICONE STR 0.3MM UFLOW (MISCELLANEOUS) IMPLANT
TOWEL OR 17X24 6PK STRL BLUE (TOWEL DISPOSABLE) ×4 IMPLANT
WATER STERILE IRR 1000ML POUR (IV SOLUTION) ×2 IMPLANT

## 2012-01-13 NOTE — Progress Notes (Signed)
Pt. Walked to Endoscopy fr. [facility] .  Tolerated procedure well.  Procedure started at 1240, completed [id number], pt. Left procedure room at 1250 /w Mother.

## 2012-01-13 NOTE — Brief Op Note (Addendum)
01/13/2012  11:24 AM  PATIENT:  Chelsey Lee  56 y.o. female  PRE-OPERATIVE DIAGNOSIS:  OPAQUE POSTERIOR CAPSULE  POST-OPERATIVE DIAGNOSIS:  Same  PROCEDURE:  Procedure(s) (LRB) with comments: MINOR CAPSULOTOMY (Left) YAG LASER APPLICATION (Left)  Total Energy            117 Total Bursts               40 Shots/Burst                  1 Energy/Shot               3.2  SURGEON:  Surgeon(s) and Role:    Myrtha Mantis., MD - Primary  PHYSICIAN ASSISTANT:   ASSISTANTS: none   ANESTHESIA:   none  EBL:     BLOOD ADMINISTERED:none  DRAINS: none   LOCAL MEDICATIONS USED:  NONE  SPECIMEN:  No Specimen  DISPOSITION OF SPECIMEN:  N/A  COUNTS:  YES  TOURNIQUET:  * No tourniquets in log *  DICTATION: .Other Dictation: Dictation Number [id number]  PLAN OF CARE: Discharge to home after PACU  PATIENT DISPOSITION:  Short Stay   Delay start of Pharmacological VTE agent (>24hrs) due to surgical blood loss or risk of bleeding: not applicable

## 2012-01-13 NOTE — H&P (Signed)
56 yo female has had cataract surgery os.  Has developed cloudy posterior capsule os which is causing her to have blurred vision.  Admitted for yag laser capsulotomy.

## 2012-01-14 ENCOUNTER — Encounter (HOSPITAL_COMMUNITY): Payer: Self-pay | Admitting: Ophthalmology

## 2012-01-14 NOTE — Op Note (Signed)
NAME:  FRADY, BETLER NO.:  [id number]  MEDICAL RECORD NO.:  [id number]  LOCATION:  [facility]                         FACILITY:  [facility]  PHYSICIAN:  Garlan Fair., M.D.DATE OF BIRTH:  [birth year]  DATE OF PROCEDURE:  01/13/2012 DATE OF DISCHARGE:                              OPERATIVE REPORT   PREOPERATIVE DIAGNOSIS:  Opaque posterior capsule, left eye.  POSTOPERATIVE DIAGNOSIS:  Opaque posterior capsule, left eye.  OPERATION:  YAG laser capsulotomy.  JUSTIFICATION FOR PROCEDURE:  This is a 56 year old lady who has had cataract extracted from the left eye.  She is diabetic but her sugar is presently well controlled.  She was evaluated and found her vision acuity best corrected to 20/25 on the right, 20/40 on the left.  There was an opaque posterior capsule present on the left with intraocular lens seated in good position in that left eye.  YAG laser capsulotomy was recommended.  She was admitted at this time for the proposed procedure.  PROCEDURE:  Under influence of topical anesthesia, the patient was seated appropriately behind the YAG laser.  After explaining the procedure to her, 40 applications of laser energy were applied to the posterior capsule obtaining a satisfactory opening.  The patient tolerated the procedure well, discharged to the post anesthesia recovery in satisfactory condition.  She is to see me in office afternoon at 5 o'clock for further evaluation and for measurement of the intraocular pressure.  DISCHARGE DIAGNOSIS:  Opaque posterior capsule, left eye.     Garlan Fair., M.D.     TB/MEDQ  D:  01/13/2012  T:  01/14/2012  Job:  [id number]

## 2012-08-07 ENCOUNTER — Other Ambulatory Visit (HOSPITAL_COMMUNITY): Payer: Self-pay | Admitting: Endocrinology

## 2012-08-07 DIAGNOSIS — Z1231 Encounter for screening mammogram for malignant neoplasm of breast: Secondary | ICD-10-CM

## 2012-08-17 ENCOUNTER — Ambulatory Visit (HOSPITAL_COMMUNITY)
Admission: RE | Admit: 2012-08-17 | Discharge: 2012-08-17 | Disposition: A | Discharge status: Home / Self Care | Payer: BC Managed Care – PPO | Source: Ambulatory Visit | Attending: Endocrinology | Admitting: Endocrinology

## 2012-08-17 DIAGNOSIS — Z1231 Encounter for screening mammogram for malignant neoplasm of breast: Secondary | ICD-10-CM | POA: Insufficient documentation

## 2013-03-05 ENCOUNTER — Other Ambulatory Visit: Payer: Self-pay | Admitting: Orthopedic Surgery

## 2013-03-05 DIAGNOSIS — S86309A Unspecified injury of muscle(s) and tendon(s) of peroneal muscle group at lower leg level, unspecified leg, initial encounter: Secondary | ICD-10-CM

## 2013-03-05 DIAGNOSIS — M25879 Other specified joint disorders, unspecified ankle and foot: Secondary | ICD-10-CM

## 2013-03-13 ENCOUNTER — Ambulatory Visit
Admission: RE | Admit: 2013-03-13 | Discharge: 2013-03-13 | Disposition: A | Discharge status: Home / Self Care | Payer: BC Managed Care – PPO | Source: Ambulatory Visit | Attending: Orthopedic Surgery | Admitting: Orthopedic Surgery

## 2013-03-13 DIAGNOSIS — S86309A Unspecified injury of muscle(s) and tendon(s) of peroneal muscle group at lower leg level, unspecified leg, initial encounter: Secondary | ICD-10-CM

## 2013-03-13 DIAGNOSIS — M25879 Other specified joint disorders, unspecified ankle and foot: Secondary | ICD-10-CM

## 2013-08-19 ENCOUNTER — Other Ambulatory Visit (HOSPITAL_COMMUNITY): Payer: Self-pay | Admitting: Endocrinology

## 2013-08-19 DIAGNOSIS — Z1231 Encounter for screening mammogram for malignant neoplasm of breast: Secondary | ICD-10-CM

## 2013-08-31 ENCOUNTER — Ambulatory Visit (HOSPITAL_COMMUNITY): Payer: BC Managed Care – PPO | Attending: Endocrinology

## 2013-09-15 ENCOUNTER — Ambulatory Visit (HOSPITAL_COMMUNITY)
Admission: RE | Admit: 2013-09-15 | Discharge: 2013-09-15 | Disposition: A | Discharge status: Home / Self Care | Payer: BC Managed Care – PPO | Source: Ambulatory Visit | Attending: Endocrinology | Admitting: Endocrinology

## 2013-09-15 DIAGNOSIS — Z1231 Encounter for screening mammogram for malignant neoplasm of breast: Secondary | ICD-10-CM | POA: Diagnosis not present

## 2013-10-30 ENCOUNTER — Ambulatory Visit (INDEPENDENT_AMBULATORY_CARE_PROVIDER_SITE_OTHER): Payer: BLUE CROSS/BLUE SHIELD | Admitting: Emergency Medicine

## 2013-10-30 VITALS — BP 160/80 | HR 81 | Temp 98.6°F | Resp 12 | Ht 68.0 in | Wt 287.0 lb

## 2013-10-30 DIAGNOSIS — J209 Acute bronchitis, unspecified: Secondary | ICD-10-CM

## 2013-10-30 MED ORDER — PROMETHAZINE-CODEINE 6.25-10 MG/5ML PO SYRP: 5.0000 mL | ORAL_SOLUTION | Freq: Four times a day (QID) | ORAL | Status: DC | PRN

## 2013-10-30 MED ORDER — AZITHROMYCIN 250 MG PO TABS: ORAL_TABLET | ORAL | Status: DC

## 2013-10-30 NOTE — Progress Notes (Signed)
[facility] [address], [place] [address] [phone]  Date:  10/30/2013   Name:  Chelsey Lee   DOB:  December 08, 1955   MRN:  [id number]  PCP:  Sheela Stack, MD    Chief Complaint: Cough, Sore Throat and Nasal Congestion   History of Present Illness:  Chelsey Lee is a 58 y.o. very pleasant female patient who presents with the following:  Ill since yesterday with nasal congestion and drainage.  Mostly watery.  No post nasal drip Has a sore throat.  No fever or chills Cough productive scant mucopurulent sputum No wheezing or shortness of breath.  No ill contacts. No improvement with over the counter medications or other home remedies.  Denies other complaint or health concern today.   There are no active problems to display for this patient.   Past Medical History  Diagnosis Date  . Hypertension   . Seasonal allergies   . Hyperlipemia   . Diabetes mellitus   . GERD (gastroesophageal reflux disease)   . Arthritis     Past Surgical History  Procedure Laterality Date  . Dorsal compartment release  2009    rt   . Trigger finger release  2006    rt thumb  . Knee arthroscopy  02,04    right and left  . Trigger finger release  08/01/2011    Procedure: RELEASE TRIGGER FINGER/A-1 PULLEY;  Surgeon: Cammie Sickle., MD;  Location: [place];  Service: Orthopedics;  Laterality: Left;  left thumb  . Steriod injection  08/01/2011    Procedure: STEROID INJECTION;  Surgeon: Cammie Sickle., MD;  Location: [place];  Service: Orthopedics;  Laterality: Left;  cmc left thumb  . Capsulotomy  01/13/2012    Procedure: MINOR CAPSULOTOMY;  Surgeon: Myrtha Mantis., MD;  Location: [place];  Service: Ophthalmology;  Laterality: Left;  . Yag laser application  [id number]    Procedure: YAG LASER APPLICATION;  Surgeon: Myrtha Mantis., MD;  Location: [place];  Service: Ophthalmology;  Laterality: Left;     History  Substance Use Topics  . Smoking status: Never Smoker   . Smokeless tobacco: Never Used  . Alcohol Use: No    History reviewed. No pertinent family history.  No Known Allergies  Medication list has been reviewed and updated.  Current Outpatient Prescriptions on File Prior to Visit  Medication Sig Dispense Refill  . atorvastatin (LIPITOR) 20 MG tablet Take 20 mg by mouth daily.      . carvedilol (COREG) 25 MG tablet Take 25 mg by mouth 2 (two) times daily with a meal.      . fenofibrate 160 MG tablet Take 160 mg by mouth daily.      Marland Kitchen glipiZIDE (GLUCOTROL) 10 MG tablet Take 10 mg by mouth 2 (two) times daily before a meal.      . Liraglutide (VICTOZA) 18 MG/3ML SOLN Inject 1.8 mg into the skin every evening.      Marland Kitchen losartan-hydrochlorothiazide (HYZAAR) 100-12.5 MG per tablet Take 1 tablet by mouth daily.      Marland Kitchen omeprazole (PRILOSEC) 20 MG capsule Take 20 mg by mouth daily.      Marland Kitchen HYDROcodone-acetaminophen (NORCO) 5-325 MG per tablet Take 1 tablet by mouth every 6 (six) hours as needed. For pain      . insulin glargine (LANTUS) 100 UNIT/ML injection Inject 52 Units into the skin daily.      Marland Kitchen  metFORMIN (GLUCOPHAGE) 500 MG tablet Take 500 mg by mouth 2 (two) times daily with a meal.       No current facility-administered medications on file prior to visit.    Review of Systems:  As per HPI, otherwise negative.    Physical Examination: Filed Vitals:   10/30/13 1421  BP: 160/80  Pulse: 81  Temp: 98.6 F (37 C)  Resp: 12   Filed Vitals:   10/30/13 1421  Height: 5\' 8"  (1.727 m)  Weight: 287 lb (130.182 kg)   Body mass index is 43.65 kg/(m^2). Ideal Body Weight: Weight in (lb) to have BMI = 25: 164.1  GEN: WDWN, NAD, Non-toxic, A & O x 3 HEENT: Atraumatic, Normocephalic. Neck supple. No masses, No LAD. Ears and Nose: No external deformity. CV: RRR, No M/G/R. No JVD. No thrill. No extra heart sounds. PULM: CTA B, no wheezes, crackles, rhonchi. No  retractions. No resp. distress. No accessory muscle use. ABD: S, NT, ND, +BS. No rebound. No HSM. EXTR: No c/c/e NEURO Normal gait.  PSYCH: Normally interactive. Conversant. Not depressed or anxious appearing.  Calm demeanor.    Assessment and Plan: Bronchitis zpak Phen c cod  Signed,  Ellison Carwin, MD

## 2013-10-30 NOTE — Patient Instructions (Signed)

## 2014-03-15 ENCOUNTER — Other Ambulatory Visit (HOSPITAL_COMMUNITY): Payer: Self-pay

## 2014-03-15 ENCOUNTER — Other Ambulatory Visit (HOSPITAL_COMMUNITY): Payer: Self-pay | Admitting: Endocrinology

## 2014-03-15 DIAGNOSIS — R600 Localized edema: Secondary | ICD-10-CM

## 2014-03-24 ENCOUNTER — Ambulatory Visit (HOSPITAL_COMMUNITY): Payer: 59 | Attending: Endocrinology

## 2014-03-24 ENCOUNTER — Other Ambulatory Visit (HOSPITAL_COMMUNITY): Payer: Self-pay | Admitting: Endocrinology

## 2014-03-24 DIAGNOSIS — R600 Localized edema: Secondary | ICD-10-CM

## 2014-03-24 NOTE — Progress Notes (Signed)
2D Echo completed. 03/24/2014 

## 2014-04-11 ENCOUNTER — Institutional Professional Consult (permissible substitution): Payer: BLUE CROSS/BLUE SHIELD | Admitting: Medical

## 2014-04-13 ENCOUNTER — Ambulatory Visit (INDEPENDENT_AMBULATORY_CARE_PROVIDER_SITE_OTHER): Payer: 59 | Admitting: Medical

## 2014-04-13 ENCOUNTER — Encounter: Payer: Self-pay | Admitting: Medical

## 2014-04-13 VITALS — BP 110/60 | HR 63 | Temp 98.5°F | Resp 14 | Ht 67.8 in | Wt 301.0 lb

## 2014-04-13 DIAGNOSIS — Z794 Long term (current) use of insulin: Secondary | ICD-10-CM

## 2014-04-13 DIAGNOSIS — IMO0001 Reserved for inherently not codable concepts without codable children: Secondary | ICD-10-CM

## 2014-04-13 DIAGNOSIS — R609 Edema, unspecified: Secondary | ICD-10-CM

## 2014-04-13 DIAGNOSIS — N183 Chronic kidney disease, stage 3 unspecified: Secondary | ICD-10-CM

## 2014-04-13 DIAGNOSIS — I10 Essential (primary) hypertension: Secondary | ICD-10-CM

## 2014-04-13 DIAGNOSIS — E119 Type 2 diabetes mellitus without complications: Secondary | ICD-10-CM

## 2014-04-13 NOTE — Progress Notes (Signed)
Subjective: Here as a new patient.  accompanied by her sister.  Up til now her main provider has been her endocrinologist, Dr. Forde Dandy.  She also sees  [facility].    Her history is significant for diabetes x 20+ years, HTN, CKDIII, edema, obesity.  Here today for concern for leg edema x 1 month.  Has had some SOB.  No chest pain.  She notes that she went to her kidney doctor Monday 4 days ago, Dr. Florene Glen for the same.  Was already on Lasix 20mg  before Monday.  Dr. Florene Glen increased to Lasix 80mg  and potassium, was advise to call today to see if it helped.  She notes weight Monday was 315lb.   Her weight today is 301lb, 14lb drop in 4 days on Lasix 80mg .   She doesn't wear compression hose.   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  She denies overdoing sweets or salt lately.  Her glucose has been averaging 160s which she says is good for her.   She had echocardiogram in 03/2014 which was reportedly normal.  No hx/o CHF.   Had labs Monday at the nephrology office.  No other aggravating or relieving factors. No other complaint.  Past Medical History  Diagnosis Date  . Hypertension   . Seasonal allergies   . Hyperlipemia   . Diabetes mellitus   . GERD (gastroesophageal reflux disease)   . Arthritis    ROS as in subjective  Objective: BP 110/60 mmHg  Pulse 63  Temp(Src) 98.5 F (36.9 C) (Oral)  Resp 14  Ht 5' 7.8" (1.722 m)  Wt 301 lb (136.533 kg)  BMI 46.04 kg/m2   Wt Readings from Last 3 Encounters:  04/13/14 301 lb (136.533 kg)  10/30/13 287 lb (130.182 kg)  07/31/11 280 lb (127.007 kg)   General appearance: alert, no distress, WD/WN, obese AA female Oral cavity: MMM, no lesions Neck: supple, no lymphadenopathy, no thyromegaly, no masses, no JVD Heart: RRR, normal S1, S2, no murmurs Lungs: CTA bilaterally, no wheezes, rhonchi, or rales Abdomen: +bs, soft, obese, non tender, non distended, no masses, no hepatomegaly, no splenomegaly Ext: 2+  bilat LE pitting edema up to knees including feet.  Slight asymmetry, right calve 19inch diameter, left calve 18inch diameter but nontender, no palpable cord, no erythema or discoloration Pulses: 2+ symmetric, upper and 1+ lower extremities, normal cap refill   Assessment: Encounter Diagnoses  Name Primary?  . Edema Yes  . Chronic kidney disease, stage 3 (moderate)   . IDDM (insulin dependent diabetes mellitus)   . Essential hypertension   . Morbid obesity      Plan:  I reviewed the labs and office notes requested from [facility] from Monday of this week. I also spoke to Dr. Florene Glen at [facility], and after discussing options and symptoms and treatment, she will continue Lasix 80 mg once daily in the morning. , supplemental potassium, daily weight checks, compression hose and I gave her a prescription today for updating compression hose , discussed limiting salt intake and keeping water consistent, , she will follow-up with [facility] and subsequently with Dr. Forde Dandy.  F/u in 3-4wk

## 2014-05-10 ENCOUNTER — Telehealth: Payer: Self-pay | Admitting: Medical

## 2014-05-10 NOTE — Telephone Encounter (Signed)
Referral done and faxed over to kidney doctor

## 2014-05-10 NOTE — Telephone Encounter (Signed)
pls do referral.

## 2014-05-10 NOTE — Telephone Encounter (Signed)
Please call patient left message that she needs referral [facility]) to see Dr. Florene Glen her kidney doctor, she has appointment with him and 05/12/14

## 2014-08-02 ENCOUNTER — Encounter: Payer: 59 | Attending: Endocrinology | Admitting: Dietician

## 2014-08-02 ENCOUNTER — Encounter: Payer: Self-pay | Admitting: Dietician

## 2014-08-02 VITALS — Ht 68.0 in | Wt 298.0 lb

## 2014-08-02 DIAGNOSIS — E118 Type 2 diabetes mellitus with unspecified complications: Secondary | ICD-10-CM | POA: Insufficient documentation

## 2014-08-02 DIAGNOSIS — N289 Disorder of kidney and ureter, unspecified: Secondary | ICD-10-CM | POA: Insufficient documentation

## 2014-08-02 DIAGNOSIS — I10 Essential (primary) hypertension: Secondary | ICD-10-CM | POA: Insufficient documentation

## 2014-08-02 DIAGNOSIS — Z713 Dietary counseling and surveillance: Secondary | ICD-10-CM | POA: Diagnosis not present

## 2014-08-02 DIAGNOSIS — Z6841 Body Mass Index (BMI) 40.0 and over, adult: Secondary | ICD-10-CM | POA: Diagnosis not present

## 2014-08-02 NOTE — Progress Notes (Signed)
Diabetes Self-Management Education  Visit Type: First/Initial  Appt. Start Time: 1100 Appt. End Time: 1215  08/02/2014  [name], identified by name and date of birth, is a 59 y.o. female with a diagnosis of Diabetes: Type 2 (about 25 years).  Other people present during visit:  Patient   Patient is here alone.  She has increased stress due to caring for both parents (father with Alzheimer's).  Both parents eat at different times of the day.  Sister will stay with parents when scheduled.  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  She is to see Oren Section (educator or dietitian) at Dr. Baldwin [facility] office in a couple of weeks.  She is followed by Dr. Forde Dandy for her Diabetes.  Hx of type 2 diabetes for the past 25 years.  Meal schedule is erratic and often misses lunch then snacks late.   She takes 60 units of Humolog 50/50 in the am and pm  Diet was increased fast food until ended job 1 year ago.  She often would speak of her father's eating habits more than her own.  Reports that her father has type 2 diabetes as well. She reports increased fluid weight and hx of stage 3 kidney disease.  Reducing sodium also discussed.  ASSESSMENT  Height 5\' 8"  (1.727 m), weight 298 lb (135.172 kg). Body mass index is 45.32 kg/(m^2).  Initial Visit Information:  Are you currently following a meal plan?: No   Are you taking your medications as prescribed?: Yes Are you checking your feet?: Yes How many days per week are you checking your feet?: 7 How often do you need to have someone help you when you read instructions, pamphlets, or other written materials from your doctor or pharmacy?: 1 - Never What is the last grade level you completed in school?: 3 years college  Psychosocial:     Patient Belief/Attitude about Diabetes: Afraid Self-care barriers: Other (comment) (caregiver to parents) Self-management support: Doctor's office, Family Other persons present: Patient Patient  Concerns: Nutrition/Meal planning Special Needs: None Preferred Learning Style: No preference indicated Learning Readiness: Ready  Complications:   Last HgB A1C per patient/outside source: 13.1 mg/dL (05/06/14) How often do you check your blood sugar?: 1-2 times/day Fasting Blood glucose range (mg/dL): 70-129, 130-179 Number of hypoglycemic episodes per month: 0 Number of hyperglycemic episodes per week: 1 Can you tell when your blood sugar is high?: Yes What do you do if your blood sugar is high?: drinks more water and walks Have you had a dilated eye exam in the past 12 months?: Yes Have you had a dental exam in the past 12 months?: No  Diet Intake:  Breakfast: Toast, grits or fruit, pork sausage, egg (8-9) Snack (morning): none Lunch: skips Snack (afternoon): nibbling while cooking  Dinner: spinach or other vege and baked chicken, or fried fish OR 2 hotdog on bun (4-6) Beverage(s): water, fresca  Exercise:  Exercise: Moderate (swimming / aerobic walking), Light (walking / raking leaves) Light Exercise amount of time (min / week): 30 Moderate Exercise amount of time (min / week): 120  Individualized Plan for Diabetes Self-Management Training:   Learning Objective:  Patient will have a greater understanding of diabetes self-management.  Patient education plan per assessed needs and concerns is to attend individual sessions for     Education Topics Reviewed with Patient Today:    Role of diet in the treatment of diabetes and the relationship between the three main macronutrients  and blood glucose level, Food label reading, portion sizes and measuring food., Carbohydrate counting, Meal options for control of blood glucose level and chronic complications. Role of exercise on diabetes management, blood pressure control and cardiac health.       Relationship between chronic complications and blood glucose control, Dental care, Retinopathy and reason for yearly dilated eye  exams Worked with patient to identify barriers to care and solutions   Lifestyle issues that need to be addressed for better diabetes care  PATIENTS GOALS/Plan (Developed by the patient):  Nutrition: Follow meal plan discussed, General guidelines for healthy choices and portions discussed Physical Activity: Exercise 3-5 times per week, 30 minutes per day Medications: take my medication as prescribed Monitoring : test my blood glucose as discussed (note x per day with comment) Reducing Risk: do foot checks daily, examine blood glucose patterns Health Coping: Other (comment) (support in caring for parents so that you can care for you)  Plan:   There are no Patient Instructions on file for this visit.  Expected Outcomes:  Demonstrated interest in learning. Expect positive outcomes  Education material provided: Living Well with Diabetes, Food label handouts, A1C conversion sheet, Meal plan card, My Plate and Snack sheet Sample meal plans  If problems or questions, patient to contact team via:  Phone  Future DSME appointment: PRN

## 2014-08-24 ENCOUNTER — Ambulatory Visit: Payer: 59 | Admitting: Podiatry

## 2015-02-08 ENCOUNTER — Encounter (HOSPITAL_COMMUNITY): Payer: Self-pay | Admitting: *Deleted

## 2015-02-08 ENCOUNTER — Emergency Department (HOSPITAL_COMMUNITY)
Admission: EM | Admit: 2015-02-08 | Discharge: 2015-02-08 | Disposition: A | Discharge status: Home / Self Care | Payer: BLUE CROSS/BLUE SHIELD | Attending: Emergency Medicine | Admitting: Emergency Medicine

## 2015-02-08 DIAGNOSIS — Z79899 Other long term (current) drug therapy: Secondary | ICD-10-CM | POA: Diagnosis not present

## 2015-02-08 DIAGNOSIS — E119 Type 2 diabetes mellitus without complications: Secondary | ICD-10-CM | POA: Insufficient documentation

## 2015-02-08 DIAGNOSIS — E785 Hyperlipidemia, unspecified: Secondary | ICD-10-CM | POA: Diagnosis not present

## 2015-02-08 DIAGNOSIS — M79651 Pain in right thigh: Secondary | ICD-10-CM | POA: Diagnosis not present

## 2015-02-08 DIAGNOSIS — I10 Essential (primary) hypertension: Secondary | ICD-10-CM | POA: Diagnosis not present

## 2015-02-08 DIAGNOSIS — Z8719 Personal history of other diseases of the digestive system: Secondary | ICD-10-CM | POA: Insufficient documentation

## 2015-02-08 DIAGNOSIS — Z794 Long term (current) use of insulin: Secondary | ICD-10-CM | POA: Diagnosis not present

## 2015-02-08 MED ORDER — METHOCARBAMOL 500 MG PO TABS: 500.0000 mg | ORAL_TABLET | Freq: Two times a day (BID) | ORAL | Status: DC

## 2015-02-08 NOTE — ED Provider Notes (Signed)
CSN: [id number]     Arrival date & time 02/08/15  1100 History   First MD Initiated Contact with Patient 02/08/15 1220     Chief Complaint  Patient presents with  . Leg Pain     (Consider location/radiation/quality/duration/timing/severity/associated sxs/prior Treatment) HPI Comments: Patient here complaining of one month history of intermittent right thigh discomfort that is worse in the morning and after she has a hot flash. Denies any leg swelling or tenderness. No fever or chills. No chest discomfort. No numbness to her right foot. Pain is relieved on its own and does get worse at times when she walks. Denies any rashes to the area. Called her doctor told to come here  Patient is a 60 y.o. female presenting with leg pain. The history is provided by the patient.  Leg Pain   Past Medical History  Diagnosis Date  . Hypertension   . Seasonal allergies   . Hyperlipemia   . Diabetes mellitus   . GERD (gastroesophageal reflux disease)   . Arthritis    Past Surgical History  Procedure Laterality Date  . Dorsal compartment release  2009    rt   . Trigger finger release  2006    rt thumb  . Knee arthroscopy  02,04    right and left  . Trigger finger release  08/01/2011    Procedure: RELEASE TRIGGER FINGER/A-1 PULLEY;  Surgeon: Cammie Sickle., MD;  Location: [place];  Service: Orthopedics;  Laterality: Left;  left thumb  . Steriod injection  08/01/2011    Procedure: STEROID INJECTION;  Surgeon: Cammie Sickle., MD;  Location: [place];  Service: Orthopedics;  Laterality: Left;  cmc left thumb  . Capsulotomy  01/13/2012    Procedure: MINOR CAPSULOTOMY;  Surgeon: Myrtha Mantis., MD;  Location: [place];  Service: Ophthalmology;  Laterality: Left;  . Yag laser application  [id number]    Procedure: YAG LASER APPLICATION;  Surgeon: Myrtha Mantis., MD;  Location: [place];  Service: Ophthalmology;  Laterality: Left;   No family  history on file. Social History  Substance Use Topics  . Smoking status: Never Smoker   . Smokeless tobacco: Never Used  . Alcohol Use: No   OB History    No data available     Review of Systems  All other systems reviewed and are negative.     Allergies  Review of patient's allergies indicates no known allergies.  Home Medications   Prior to Admission medications   Medication Sig Start Date End Date Taking? Authorizing Provider  atorvastatin (LIPITOR) 20 MG tablet Take 20 mg by mouth daily.    Historical Provider, MD  carvedilol (COREG) 25 MG tablet Take 25 mg by mouth 2 (two) times daily with a meal.    Historical Provider, MD  escitalopram (LEXAPRO) 10 MG tablet Take 10 mg by mouth daily.    Historical Provider, MD  fenofibrate 160 MG tablet Take 160 mg by mouth daily.    Historical Provider, MD  furosemide (LASIX) 80 MG tablet Take 80 mg by mouth 2 (two) times daily.    Historical Provider, MD  insulin lispro protamine-lispro (HUMALOG 50/50 MIX) (50-50) 100 UNIT/ML SUSP injection Inject into the skin 2 (two) times daily before a meal.    Historical Provider, MD  losartan-hydrochlorothiazide (HYZAAR) 100-12.5 MG per tablet Take 1 tablet by mouth daily.    Historical Provider, MD  potassium chloride SA (K-DUR,KLOR-CON) 20 MEQ tablet Take  20 mEq by mouth 2 (two) times daily.    Historical Provider, MD  pregabalin (LYRICA) 50 MG capsule Take 50 mg by mouth daily.     Historical Provider, MD  traMADol (ULTRAM) 50 MG tablet Take 100 mg by mouth every 8 (eight) hours as needed.    Historical Provider, MD  Vitamin D, Ergocalciferol, (DRISDOL) 50000 UNITS CAPS capsule Take 50,000 Units by mouth every 7 (seven) days.    Historical Provider, MD   BP 156/69 mmHg  Pulse 64  Temp(Src) 97.5 F (36.4 C) (Oral)  Resp 18  SpO2 100% Physical Exam  Constitutional: She is oriented to person, place, and time. She appears well-developed and well-nourished.  Non-toxic appearance. No distress.    HENT:  Head: Normocephalic and atraumatic.  Eyes: Conjunctivae, EOM and lids are normal. Pupils are equal, round, and reactive to light.  Neck: Normal range of motion. Neck supple. No tracheal deviation present. No thyroid mass present.  Cardiovascular: Normal rate, regular rhythm and normal heart sounds.  Exam reveals no gallop.   No murmur heard. Pulmonary/Chest: Effort normal and breath sounds normal. No stridor. No respiratory distress. She has no decreased breath sounds. She has no wheezes. She has no rhonchi. She has no rales.  Abdominal: Soft. Normal appearance and bowel sounds are normal. She exhibits no distension. There is no tenderness. There is no rebound and no CVA tenderness.  Musculoskeletal: Normal range of motion. She exhibits no edema or tenderness.       Legs: Neurological: She is alert and oriented to person, place, and time. She has normal strength. No cranial nerve deficit or sensory deficit. GCS eye subscore is 4. GCS verbal subscore is 5. GCS motor subscore is 6.  Skin: Skin is warm and dry. No abrasion and no rash noted.  Psychiatric: She has a normal mood and affect. Her speech is normal and behavior is normal.  Nursing note and vitals reviewed.   ED Course  Procedures (including critical care time) Labs Review Labs Reviewed - No data to display  Imaging Review No results found. I have personally reviewed and evaluated these images and lab results as part of my medical decision-making.   EKG Interpretation None      MDM   Final diagnoses:  None    Patient without evidence of compartment syndrome or concern for deep venous thrombosis. Suspect that this is a nerve conduction initiated. She does have a history of diabetes. We placed on muscle relaxants and given referral back to her doctor as well as return precautions    [name], MD 02/08/15 1244

## 2015-02-08 NOTE — ED Notes (Addendum)
Pt complains of intermittent tingling pain in her right upper thigh for the past month. Pt states the pain comes about every third day. Pt states her leg is tender to touch when the pain comes. Pt denies injury to her leg. Pt states she feels a "hot flash" then feels the pain in her right upper thigh. Pt states it is difficult to walk when she has pain in leg.

## 2015-02-08 NOTE — Discharge Instructions (Signed)

## 2015-04-04 ENCOUNTER — Ambulatory Visit (HOSPITAL_BASED_OUTPATIENT_CLINIC_OR_DEPARTMENT_OTHER): Payer: BLUE CROSS/BLUE SHIELD | Attending: Family | Admitting: Radiology

## 2015-04-04 VITALS — Ht 66.0 in | Wt 278.0 lb

## 2015-04-04 DIAGNOSIS — G4733 Obstructive sleep apnea (adult) (pediatric): Secondary | ICD-10-CM | POA: Insufficient documentation

## 2015-04-04 DIAGNOSIS — G4700 Insomnia, unspecified: Secondary | ICD-10-CM | POA: Insufficient documentation

## 2015-04-04 DIAGNOSIS — E119 Type 2 diabetes mellitus without complications: Secondary | ICD-10-CM | POA: Insufficient documentation

## 2015-04-04 DIAGNOSIS — Z6841 Body Mass Index (BMI) 40.0 and over, adult: Secondary | ICD-10-CM | POA: Insufficient documentation

## 2015-04-04 DIAGNOSIS — I10 Essential (primary) hypertension: Secondary | ICD-10-CM | POA: Insufficient documentation

## 2015-04-04 DIAGNOSIS — E669 Obesity, unspecified: Secondary | ICD-10-CM | POA: Insufficient documentation

## 2015-04-04 DIAGNOSIS — R0683 Snoring: Secondary | ICD-10-CM | POA: Insufficient documentation

## 2015-04-06 ENCOUNTER — Other Ambulatory Visit: Payer: Self-pay | Admitting: Orthopedic Surgery

## 2015-04-06 DIAGNOSIS — M545 Low back pain: Secondary | ICD-10-CM

## 2015-04-06 DIAGNOSIS — M5416 Radiculopathy, lumbar region: Secondary | ICD-10-CM

## 2015-04-09 DIAGNOSIS — G4733 Obstructive sleep apnea (adult) (pediatric): Secondary | ICD-10-CM | POA: Diagnosis not present

## 2015-04-09 NOTE — Progress Notes (Signed)
   Patient Name: Chelsey Lee, Chelsey Lee Date: 04/04/2015 Gender: Female D.O.B: [birth year] Age (years): 59 Referring Provider: Priscille Loveless Height (inches): 9 Interpreting Physician: Baird Lyons MD, ABSM Weight (lbs): 278 RPSGT: Carolin Coy BMI: 55 MRN: [id number] Neck Size: 14.50 CLINICAL INFORMATION Sleep Study Type: NPSG Indication for sleep study: Diabetes, Hypertension, Obesity, Snoring Epworth Sleepiness Score: 20  SLEEP STUDY TECHNIQUE As per the AASM Manual for the Scoring of Sleep and Associated Events v2.3 (April 2016) with a hypopnea requiring 4% desaturations. The channels recorded and monitored were frontal, central and occipital EEG, electrooculogram (EOG), submentalis EMG (chin), nasal and oral airflow, thoracic and abdominal wall motion, anterior tibialis EMG, snore microphone, electrocardiogram, and pulse oximetry.  MEDICATIONS Patient's medications include: charted for reivew Medications self-administered by patient during sleep study : No sleep medicine administered.  SLEEP ARCHITECTURE The study was initiated at 11:18:54 PM and ended at 5:12:14 AM. Sleep onset time was 4.4 minutes and the sleep efficiency was 65.5%. The total sleep time was 231.5 minutes. Stage REM latency was 318.0 minutes. The patient spent 28.94% of the night in stage N1 sleep, 62.20% in stage N2 sleep, 0.00% in stage N3 and 8.86% in REM. Alpha intrusion was absent. Supine sleep was 43.63%. Wake after sleep onset 117 minutes  RESPIRATORY PARAMETERS The overall apnea/hypopnea index (AHI) was 24.4 per hour. There were 24 total apneas, including 20 obstructive, 2 central and 2 mixed apneas. There were 70 hypopneas and 66 RERAs. The AHI during Stage REM sleep was 87.8 per hour. AHI while supine was 37.4 per hour. The mean oxygen saturation was 94.05%. The minimum SpO2 during sleep was 77.00%. Loud snoring was noted during this study.  CARDIAC DATA The 2 lead EKG demonstrated  sinus rhythm. The mean heart rate was 70.90 beats per minute. Other EKG findings include: None.  LEG MOVEMENT DATA The total PLMS were 77 with a resulting PLMS index of 19.96. Associated arousal with leg movement index was 2.3 .  IMPRESSIONS - Moderate obstructive sleep apnea occurred during this study (AHI = 24.4/h). - Markedly fragmented sleep with frequent awakenings. There was not enough initial sleep time to meet protocol requirement for split CPAP titration. - No significant central sleep apnea occurred during this study (CAI = 0.5/h). - Moderate oxygen desaturation was noted during this study (Min O2 = 77.00%). - The patient snored with Loud snoring volume. - No cardiac abnormalities were noted during this study. - Mild periodic limb movements of sleep occurred during the study. No significant associated arousals.  DIAGNOSIS - Obstructive Sleep Apnea (327.23 [G47.33 ICD-10]) - Difficulty initiating and maintaining sleep- insomnia  RECOMMENDATIONS - Therapeutic CPAP titration to determine optimal pressure required to alleviate sleep disordered breathing. - Positional therapy avoiding supine position during sleep. - Avoid alcohol, sedatives and other CNS depressants that may worsen sleep apnea and disrupt normal sleep architecture. - Sleep hygiene should be reviewed to assess factors that may improve sleep quality. - Weight management and regular exercise should be initiated or continued if appropriate.  Deneise Lever Diplomate, American Board of Sleep Medicine  ELECTRONICALLY SIGNED ON:  04/09/2015, 2:15 PM [place] PH: [phone]   FX: [phone] [place]

## 2015-04-14 ENCOUNTER — Ambulatory Visit
Admission: RE | Admit: 2015-04-14 | Discharge: 2015-04-14 | Disposition: A | Discharge status: Home / Self Care | Payer: BLUE CROSS/BLUE SHIELD | Source: Ambulatory Visit | Attending: Orthopedic Surgery | Admitting: Orthopedic Surgery

## 2015-04-14 DIAGNOSIS — M5416 Radiculopathy, lumbar region: Secondary | ICD-10-CM

## 2015-04-14 DIAGNOSIS — M545 Low back pain: Secondary | ICD-10-CM

## 2015-07-20 ENCOUNTER — Emergency Department (HOSPITAL_COMMUNITY): Payer: BLUE CROSS/BLUE SHIELD

## 2015-07-20 ENCOUNTER — Encounter (HOSPITAL_COMMUNITY): Payer: Self-pay | Admitting: Emergency Medicine

## 2015-07-20 ENCOUNTER — Emergency Department (HOSPITAL_BASED_OUTPATIENT_CLINIC_OR_DEPARTMENT_OTHER)
Admit: 2015-07-20 | Discharge: 2015-07-20 | Disposition: A | Discharge status: Home / Self Care | Payer: BLUE CROSS/BLUE SHIELD | Attending: Emergency Medicine | Admitting: Emergency Medicine

## 2015-07-20 ENCOUNTER — Emergency Department (HOSPITAL_COMMUNITY)
Admission: EM | Admit: 2015-07-20 | Discharge: 2015-07-20 | Disposition: A | Discharge status: Home / Self Care | Payer: BLUE CROSS/BLUE SHIELD | Attending: Emergency Medicine | Admitting: Emergency Medicine

## 2015-07-20 DIAGNOSIS — T68XXXA Hypothermia, initial encounter: Secondary | ICD-10-CM | POA: Insufficient documentation

## 2015-07-20 DIAGNOSIS — X31XXXA Exposure to excessive natural cold, initial encounter: Secondary | ICD-10-CM | POA: Insufficient documentation

## 2015-07-20 DIAGNOSIS — I10 Essential (primary) hypertension: Secondary | ICD-10-CM | POA: Diagnosis not present

## 2015-07-20 DIAGNOSIS — E785 Hyperlipidemia, unspecified: Secondary | ICD-10-CM | POA: Diagnosis not present

## 2015-07-20 DIAGNOSIS — Z79899 Other long term (current) drug therapy: Secondary | ICD-10-CM | POA: Diagnosis not present

## 2015-07-20 DIAGNOSIS — Z794 Long term (current) use of insulin: Secondary | ICD-10-CM | POA: Insufficient documentation

## 2015-07-20 DIAGNOSIS — M1990 Unspecified osteoarthritis, unspecified site: Secondary | ICD-10-CM | POA: Insufficient documentation

## 2015-07-20 DIAGNOSIS — E162 Hypoglycemia, unspecified: Secondary | ICD-10-CM

## 2015-07-20 DIAGNOSIS — E11649 Type 2 diabetes mellitus with hypoglycemia without coma: Secondary | ICD-10-CM | POA: Insufficient documentation

## 2015-07-20 DIAGNOSIS — M79604 Pain in right leg: Secondary | ICD-10-CM

## 2015-07-20 LAB — COMPREHENSIVE METABOLIC PANEL
ALT: 21 U/L (ref 14–54)
ANION GAP: 4 — AB (ref 5–15)
AST: 24 U/L (ref 15–41)
Albumin: 3.1 g/dL — ABNORMAL LOW (ref 3.5–5.0)
Alkaline Phosphatase: 28 U/L — ABNORMAL LOW (ref 38–126)
BUN: 56 mg/dL — ABNORMAL HIGH (ref 6–20)
CALCIUM: 7 mg/dL — AB (ref 8.9–10.3)
CHLORIDE: 116 mmol/L — AB (ref 101–111)
CO2: 21 mmol/L — ABNORMAL LOW (ref 22–32)
CREATININE: 1.85 mg/dL — AB (ref 0.44–1.00)
GFR, EST AFRICAN AMERICAN: 34 mL/min — AB (ref >60)
GFR, EST NON AFRICAN AMERICAN: 29 mL/min — AB (ref >60)
Glucose, Bld: 90 mg/dL (ref 65–99)
Potassium: 3.1 mmol/L — ABNORMAL LOW (ref 3.5–5.1)
Sodium: 141 mmol/L (ref 135–145)
Total Bilirubin: 0.5 mg/dL (ref 0.3–1.2)
Total Protein: 5.7 g/dL — ABNORMAL LOW (ref 6.5–8.1)

## 2015-07-20 LAB — CBC WITH DIFFERENTIAL/PLATELET
BASOS PCT: 0 %
Basophils Absolute: 0 10*3/uL (ref 0.0–0.1)
EOS ABS: 0 10*3/uL (ref 0.0–0.7)
Eosinophils Relative: 0 %
HCT: 25.9 % — ABNORMAL LOW (ref 36.0–46.0)
HEMOGLOBIN: 8.3 g/dL — AB (ref 12.0–15.0)
LYMPHS ABS: 1.5 10*3/uL (ref 0.7–4.0)
Lymphocytes Relative: 28 %
MCH: 28.4 pg (ref 26.0–34.0)
MCHC: 32 g/dL (ref 30.0–36.0)
MCV: 88.7 fL (ref 78.0–100.0)
Monocytes Absolute: 0.7 10*3/uL (ref 0.1–1.0)
Monocytes Relative: 13 %
NEUTROS ABS: 3 10*3/uL (ref 1.7–7.7)
NEUTROS PCT: 59 %
Platelets: 134 10*3/uL — ABNORMAL LOW (ref 150–400)
RBC: 2.92 MIL/uL — AB (ref 3.87–5.11)
RDW: 14.3 % (ref 11.5–15.5)
WBC: 5.2 10*3/uL (ref 4.0–10.5)

## 2015-07-20 LAB — CBG MONITORING, ED
GLUCOSE-CAPILLARY: 110 mg/dL — AB (ref 65–99)
GLUCOSE-CAPILLARY: 92 mg/dL (ref 65–99)
GLUCOSE-CAPILLARY: 74 mg/dL (ref 65–99)
Glucose-Capillary: 101 mg/dL — ABNORMAL HIGH (ref 65–99)

## 2015-07-20 NOTE — ED Provider Notes (Signed)
CSN: [id number]     Arrival date & time 07/20/15  1021 History   First MD Initiated Contact with Patient 07/20/15 1042     Chief Complaint  Patient presents with  . Hypoglycemia     (Consider location/radiation/quality/duration/timing/severity/associated sxs/prior Treatment) HPI  [name] is a 60 year old female with past medical history of hypertension, seasonal allergies, type 2 diabetes, GERD, and arthritis presenting with hypoglycemia.   The patient notes when she woke up this morning she was very weak.  She notes that she felt very tired.  She called out for help and EMS was called.  On arrival, it was noted that her speech was slurred and she had generalized weakness. Her CBG was noted to be 42. She was given an amp of D50 which improved her CBG to 128. Her symptoms subsequently resolved.  She was also noted to be hypothermic, however they used a temporal thermometer.  The patient's cannot tell me much more than that as she has poor recollection of the events.   She takes Humalog 50/50 60units BID.  She took Humalog 60 units at 1am which is typical for her schedule.  She notes she had a good dinner and snack of crackers. She denies any recent appetite loss or change in her diet. No recent illness. No diarrhea, vomiting, abdominal pain, URI symptoms, dysuria, urinary frequency.   She had an epidural steroid injection on 6/20 due to back pain/right sided sciatica. On 6/25 she received a cortisone injection in her R knee. She denies any oral steroids. She denies any new recent medications.    Of note, her sister is worried, as on 6/20 after her first epidural steroid injection, the patient fell hitting her head and arm. She noted that she's been normal since that time, but is concerned about her well being. The sister states she feels the patient may benefit from a MRI, when questioned about a MRI of what location, she states of her back due to her chronic back pain and right sided LE pain.      Past Medical History  Diagnosis Date  . Hypertension   . Seasonal allergies   . Hyperlipemia   . Diabetes mellitus   . GERD (gastroesophageal reflux disease)   . Arthritis    Past Surgical History  Procedure Laterality Date  . Dorsal compartment release  2009    rt   . Trigger finger release  2006    rt thumb  . Knee arthroscopy  02,04    right and left  . Trigger finger release  08/01/2011    Procedure: RELEASE TRIGGER FINGER/A-1 PULLEY;  Surgeon: Cammie Sickle., MD;  Location: [facility];  Service: Orthopedics;  Laterality: Left;  left thumb  . Steriod injection  08/01/2011    Procedure: STEROID INJECTION;  Surgeon: Cammie Sickle., MD;  Location: [facility];  Service: Orthopedics;  Laterality: Left;  cmc left thumb  . Capsulotomy  01/13/2012    Procedure: MINOR CAPSULOTOMY;  Surgeon: Myrtha Mantis., MD;  Location: [facility];  Service: Ophthalmology;  Laterality: Left;  . Yag laser application  71/06/2692    Procedure: YAG LASER APPLICATION;  Surgeon: Myrtha Mantis., MD;  Location: [facility];  Service: Ophthalmology;  Laterality: Left;   No family history on file. Social History  Substance Use Topics  . Smoking status: Never Smoker   . Smokeless tobacco: Never Used  . Alcohol Use: No   OB History  No data available     Review of Systems  Constitutional: Positive for diaphoresis and fatigue. Negative for fever, chills, activity change and appetite change.  HENT: Negative.   Eyes: Negative for redness and visual disturbance.  Respiratory: Negative for cough, choking, chest tightness, shortness of breath, wheezing and stridor.   Cardiovascular: Negative for chest pain, palpitations and leg swelling.  Gastrointestinal: Negative for nausea, vomiting, abdominal pain, constipation and abdominal distention.  Endocrine: Negative.   Genitourinary: Negative.   Musculoskeletal: Positive for myalgias. Negative for back pain,  joint swelling and arthralgias.  Skin: Negative for rash.  Allergic/Immunologic: Negative.   Neurological: Negative for dizziness, tremors, syncope, weakness, numbness and headaches.  Hematological: Negative.   Psychiatric/Behavioral: Negative.       Allergies  Review of patient's allergies indicates no known allergies.  Home Medications   Prior to Admission medications   Medication Sig Start Date End Date Taking? Authorizing Provider  atorvastatin (LIPITOR) 20 MG tablet Take 20 mg by mouth daily.   Yes Historical Provider, MD  carvedilol (COREG) 25 MG tablet Take 25 mg by mouth 2 (two) times daily with a meal.   Yes Historical Provider, MD  cetirizine (ZYRTEC) 10 MG tablet Take 10 mg by mouth daily as needed for allergies.   Yes Historical Provider, MD  cyanocobalamin (,VITAMIN B-12,) 1000 MCG/ML injection Inject 1,000 mcg into the muscle every 14 (fourteen) days.   Yes Historical Provider, MD  escitalopram (LEXAPRO) 10 MG tablet Take 10 mg by mouth daily.   Yes Historical Provider, MD  fenofibrate 160 MG tablet Take 160 mg by mouth daily.   Yes Historical Provider, MD  furosemide (LASIX) 80 MG tablet Take 80 mg by mouth daily.    Yes Historical Provider, MD  insulin lispro protamine-lispro (HUMALOG 50/50 MIX) (50-50) 100 UNIT/ML SUSP injection Inject 60 Units into the skin 2 (two) times daily before a meal.    Yes Historical Provider, MD  losartan-hydrochlorothiazide (HYZAAR) 100-12.5 MG per tablet Take 1 tablet by mouth daily.   Yes Historical Provider, MD  methocarbamol (ROBAXIN) 500 MG tablet Take 1 tablet (500 mg total) by mouth 2 (two) times daily. Patient taking differently: Take 500 mg by mouth 2 (two) times daily as needed for muscle spasms.  02/08/15  Yes [name], MD  potassium chloride SA (K-DUR,KLOR-CON) 20 MEQ tablet Take 20 mEq by mouth daily.    Yes Historical Provider, MD  traMADol (ULTRAM) 50 MG tablet Take 50-100 mg by mouth every 8 (eight) hours as needed for  moderate pain.    Yes Historical Provider, MD  Vitamin D, Ergocalciferol, (DRISDOL) 50000 UNITS CAPS capsule Take 50,000 Units by mouth every Thursday.    Yes Historical Provider, MD  pregabalin (LYRICA) 50 MG capsule Take 50 mg by mouth daily.     Historical Provider, MD   BP 156/70 mmHg  Pulse 85  Temp(Src) 97.5 F (36.4 C) (Oral)  Resp 18  SpO2 98% Physical Exam  Constitutional: She is oriented to person, place, and time. She appears well-developed and well-nourished. No distress.  HENT:  Head: Normocephalic.  Nose: Nose normal.  Mouth/Throat: Oropharynx is clear and moist. No oropharyngeal exudate.  Eyes: Conjunctivae are normal. Right eye exhibits no discharge. Left eye exhibits no discharge. No scleral icterus.  Neck: Normal range of motion. Neck supple. No JVD present.  Cardiovascular: Normal rate, regular rhythm, normal heart sounds and intact distal pulses.  Exam reveals no gallop and no friction rub.   No murmur heard.  Pulmonary/Chest: Effort normal and breath sounds normal. No respiratory distress. She has no wheezes. She has no rales. She exhibits no tenderness.  Abdominal: Soft. Bowel sounds are normal. She exhibits no distension. There is no tenderness. There is no rebound.  Musculoskeletal: Normal range of motion.  Trace pitting edema. No calf size discrepancy. No tenderness to palpation over the calves. Negative Homan's sign.   Lymphadenopathy:    She has no cervical adenopathy.  Neurological: She is alert and oriented to person, place, and time. She displays normal reflexes. She exhibits normal muscle tone. Coordination normal.  Skin: No rash noted. She is not diaphoretic. No pallor.  Cold  Psychiatric: She has a normal mood and affect.    ED Course  Procedures (including critical care time) Labs Review Labs Reviewed  COMPREHENSIVE METABOLIC PANEL - Abnormal; Notable for the following:    Potassium 3.1 (*)    Chloride 116 (*)    CO2 21 (*)    BUN 56 (*)     Creatinine, Ser 1.85 (*)    Calcium 7.0 (*)    Total Protein 5.7 (*)    Albumin 3.1 (*)    Alkaline Phosphatase 28 (*)    GFR calc non Af Amer 29 (*)    GFR calc Af Amer 34 (*)    Anion gap 4 (*)    All other components within normal limits  CBC WITH DIFFERENTIAL/PLATELET - Abnormal; Notable for the following:    RBC 2.92 (*)    Hemoglobin 8.3 (*)    HCT 25.9 (*)    Platelets 134 (*)    All other components within normal limits  CBG MONITORING, ED - Abnormal; Notable for the following:    Glucose-Capillary 110 (*)    All other components within normal limits  CBG MONITORING, ED - Abnormal; Notable for the following:    Glucose-Capillary 101 (*)    All other components within normal limits  CBG MONITORING, ED  CBG MONITORING, ED    Imaging Review Ct Head Wo Contrast  07/20/2015  CLINICAL DATA:  Hypoglycemia.  Fell 1 week ago. EXAM: CT HEAD WITHOUT CONTRAST TECHNIQUE: Contiguous axial images were obtained from the base of the skull through the vertex without intravenous contrast. COMPARISON:  None. FINDINGS: Diffusely enlarged ventricles and subarachnoid spaces. No skull fracture, intracranial hemorrhage, mass lesion, CT evidence of acute infarction or paranasal sinus air-fluid levels. IMPRESSION: No acute abnormality.  Minimal cerebral and cerebellar atrophy. Electronically Signed   By: Claudie Revering M.D.   On: 07/20/2015 13:12   I have personally reviewed and evaluated these images and lab results as part of my medical decision-making.   EKG Interpretation None      MDM   Final diagnoses:  Hypoglycemia  Hypothermia, initial encounter   Patient had a rectal temperature of 19F. She is placed in a bear hugger. CBC and see met were obtained. A right vascular ultrasound was obtained given concerns of right calf pain. CT head Ordered due to history of fall last week, no neurologic deficits noted on exam.  Right extremity venous Doppler was negative for acute DVT. Her hemoglobin  was 8.3, last hemoglobin was 10 3 years ago. CBG 92. Diet ordered. Recent with a creatinine of 1.85, unsure what her baseline creatinine is, as her last one was 1.33 3 years ago. Potassium slightly low at 3.1 which may be the reason for her right calf cramping. Advised patient to increase her dose of potassium today.  CBG 72. Patient  given a sandwich to eat.   CBG improved to 101. CT head negative for acute change.   Temperature improved to  99.6. Patient last took Humalog >12 hours ago therefore I suspect her hypoglycemia has resolved completely. Dicussed holding off on taking Humalog for this morning unless her CBGs get in the 200s. Advised her to check her CBGs more frequently than every other day. Advised her to resume her night time insulin after a full meal. Strict return precautions discussed.     Archie Patten, MD 07/20/15 1604  Harvel Quale, MD 07/22/15 [phone]

## 2015-07-20 NOTE — ED Notes (Signed)
Pt was steady on her feet while ambulating to the bathroom

## 2015-07-20 NOTE — Discharge Instructions (Signed)
You were seen today for your episode of weakness this appears to be secondary to your blood sugar being too low.  Do not take your Lantus tonight unless your blood sugar is in the 200s.  Call your primary care physician for further recommendations for your insulin.  Check your glucose in the morning as soon as you wake up.  Take your night time insulin around 9 PM and make sure you have eaten a full meal between 7-8 PM.    Hypoglycemia Hypoglycemia occurs when the glucose in your blood is too low. Glucose is a type of sugar that is your body's main energy source. Hormones, such as insulin and glucagon, control the level of glucose in the blood. Insulin lowers blood glucose and glucagon increases blood glucose. Having too much insulin in your blood stream, or not eating enough food containing sugar, can result in hypoglycemia. Hypoglycemia can happen to people with or without diabetes. It can develop quickly and can be a medical emergency.  CAUSES   Missing or delaying meals.  Not eating enough carbohydrates at meals.  Taking too much diabetes medicine.  Not timing your oral diabetes medicine or insulin doses with meals, snacks, and exercise.  Nausea and vomiting.  Certain medicines.  Severe illnesses, such as hepatitis, kidney disorders, and certain eating disorders.  Increased activity or exercise without eating something extra or adjusting medicines.  Drinking too much alcohol.  A nerve disorder that affects body functions like your heart rate, blood pressure, and digestion (autonomic neuropathy).  A condition where the stomach muscles do not function properly (gastroparesis). Therefore, medicines and food may not absorb properly.  Rarely, a tumor of the pancreas can produce too much insulin. SYMPTOMS   Hunger.  Sweating (diaphoresis).  Change in body temperature.  Shakiness.  Headache.  Anxiety.  Lightheadedness.  Irritability.  Difficulty concentrating.  Dry  mouth.  Tingling or numbness in the hands or feet.  Restless sleep or sleep disturbances.  Altered speech and coordination.  Change in mental status.  Seizures or prolonged convulsions.  Combativeness.  Drowsiness (lethargic).  Weakness.  Increased heart rate or palpitations.  Confusion.  Pale, gray skin color.  Blurred or double vision.  Fainting. DIAGNOSIS  A physical exam and medical history will be performed. Your caregiver may make a diagnosis based on your symptoms. Blood tests and other lab tests may be performed to confirm a diagnosis. Once the diagnosis is made, your caregiver will see if your signs and symptoms go away once your blood glucose is raised.  TREATMENT  Usually, you can easily treat your hypoglycemia when you notice symptoms.  Check your blood glucose. If it is less than 70 mg/dl, take one of the following:   3-4 glucose tablets.    cup juice.    cup regular soda.   1 cup skim milk.   -1 tube of glucose gel.   5-6 hard candies.   Avoid high-fat drinks or food that may delay a rise in blood glucose levels.  Do not take more than the recommended amount of sugary foods, drinks, gel, or tablets. Doing so will cause your blood glucose to go too high.   Wait 10-15 minutes and recheck your blood glucose. If it is still less than 70 mg/dl or below your target range, repeat treatment.   Eat a snack if it is more than 1 hour until your next meal.  There may be a time when your blood glucose may go so low that you  are unable to treat yourself at home when you start to notice symptoms. You may need someone to help you. You may even faint or be unable to swallow. If you cannot treat yourself, someone will need to bring you to the hospital.  [place]  If you have diabetes, follow your diabetes management plan by:  Taking your medicines as directed.  Following your exercise plan.  Following your meal plan. Do not skip  meals. Eat on time.  Testing your blood glucose regularly. Check your blood glucose before and after exercise. If you exercise longer or different than usual, be sure to check blood glucose more frequently.  Wearing your medical alert jewelry that says you have diabetes.  Identify the cause of your hypoglycemia. Then, develop ways to prevent the recurrence of hypoglycemia.  Do not take a hot bath or shower right after an insulin shot.  Always carry treatment with you. Glucose tablets are the easiest to carry.  If you are going to drink alcohol, drink it only with meals.  Tell friends or family members ways to keep you safe during a seizure. This may include removing hard or sharp objects from the area or turning you on your side.  Maintain a healthy weight. SEEK MEDICAL CARE IF:   You are having problems keeping your blood glucose in your target range.  You are having frequent episodes of hypoglycemia.  You feel you might be having side effects from your medicines.  You are not sure why your blood glucose is dropping so low.  You notice a change in vision or a new problem with your vision. SEEK IMMEDIATE MEDICAL CARE IF:   Confusion develops.  A change in mental status occurs.  The inability to swallow develops.  Fainting occurs.   This information is not intended to replace advice given to you by your health care provider. Make sure you discuss any questions you have with your health care provider.   Document Released: 01/07/2005 Document Revised: 01/12/2013 Document Reviewed: 09/13/2014 Elsevier Interactive Patient Education [employer].

## 2015-07-20 NOTE — ED Notes (Signed)
Ultrasound at bedside

## 2015-07-20 NOTE — ED Notes (Signed)
Per EMS pt comes from home where they were initially called out for weakness, CBG was 42. Patient given amp D50 ,  CBG 128. Patient neuro deficits resolved.  Patient has right leg cramp that "comes at night and stays a while".

## 2015-07-20 NOTE — Progress Notes (Signed)
*  Preliminary Results* Right lower extremity venous duplex completed. Right lower extremity is negative for deep vein thrombosis. There is no evidence of right Baker's cyst.  07/20/2015 12:12 PM  Maudry Mayhew, RVT, RDCS, RDMS

## 2015-07-20 NOTE — ED Notes (Signed)
MD at bedside. 

## 2015-07-20 NOTE — ED Notes (Signed)
Patient not available.

## 2015-08-04 DIAGNOSIS — E049 Nontoxic goiter, unspecified: Secondary | ICD-10-CM | POA: Insufficient documentation

## 2015-08-13 ENCOUNTER — Ambulatory Visit (HOSPITAL_BASED_OUTPATIENT_CLINIC_OR_DEPARTMENT_OTHER): Payer: BLUE CROSS/BLUE SHIELD | Attending: Family | Admitting: Internal Medicine

## 2015-08-13 VITALS — Ht 66.0 in | Wt 278.0 lb

## 2015-08-13 DIAGNOSIS — R0683 Snoring: Secondary | ICD-10-CM | POA: Diagnosis not present

## 2015-08-13 DIAGNOSIS — Z9989 Dependence on other enabling machines and devices: Secondary | ICD-10-CM

## 2015-08-13 DIAGNOSIS — G4730 Sleep apnea, unspecified: Secondary | ICD-10-CM | POA: Diagnosis present

## 2015-08-13 DIAGNOSIS — G4733 Obstructive sleep apnea (adult) (pediatric): Secondary | ICD-10-CM | POA: Diagnosis not present

## 2015-08-26 DIAGNOSIS — G4733 Obstructive sleep apnea (adult) (pediatric): Secondary | ICD-10-CM | POA: Diagnosis not present

## 2015-08-26 NOTE — Procedures (Signed)
    Patient Name: Chelsey Lee, [name] Date: 08/13/2015 Gender: Female D.O.B: [birth year] Age (years): 30 Referring Provider: Priscille Loveless Height (inches): 65 Interpreting Physician: Baird Lyons MD, ABSM Weight (lbs): 278 RPSGT: Jonna Coup BMI: 46 MRN: [id number] Neck Size: 14.50 CLINICAL INFORMATION The patient is referred for a CPAP titration to treat sleep apnea.   Date of NPSG, Split Night or HST:  Diagnostic NPSG 04/04/15- AHI 24.4/ hr, desaturation to 77%, body weight 278 lbs.  SLEEP STUDY TECHNIQUE As per the AASM Manual for the Scoring of Sleep and Associated Events v2.3 (April 2016) with a hypopnea requiring 4% desaturations. The channels recorded and monitored were frontal, central and occipital EEG, electrooculogram (EOG), submentalis EMG (chin), nasal and oral airflow, thoracic and abdominal wall motion, anterior tibialis EMG, snore microphone, electrocardiogram, and pulse oximetry. Continuous positive airway pressure (CPAP) was initiated at the beginning of the study and titrated to treat sleep-disordered breathing.  MEDICATIONS Medications taken by the patient : charted for review Medications administered by patient during sleep study : No sleep medicine administered.  TECHNICIAN COMMENTS Comments added by technician: NONE  Comments added by scorer: N/A  RESPIRATORY PARAMETERS Optimal PAP Pressure (cm): 11 AHI at Optimal Pressure (/hr): 0.0 Overall Minimal O2 (%): 91.00 Supine % at Optimal Pressure (%): 52 Minimal O2 at Optimal Pressure (%): 92.0    SLEEP ARCHITECTURE The study was initiated at 10:43:16 PM and ended at 4:48:48 AM. Sleep onset time was 2.0 minutes and the sleep efficiency was 83.0%. The total sleep time was 303.5 minutes. The patient spent 5.27% of the night in stage N1 sleep, 81.38% in stage N2 sleep, 0.00% in stage N3 and 13.34% in REM.Stage REM latency was 112.0 minutes Wake after sleep onset was 60.0 minutes. Alpha intrusion was  absent. Supine sleep was 36.24%.  CARDIAC DATA The 2 lead EKG demonstrated sinus rhythm. The mean heart rate was 60.48 beats per minute. Other EKG findings include: None.  LEG MOVEMENT DATA The total Periodic Limb Movements of Sleep (PLMS) were 225. The PLMS index was 44.48. A PLMS index of <15 is considered normal in adults.  IMPRESSIONS - The optimal PAP pressure was 11 cm of water. - Central sleep apnea was not noted during this titration (CAI = 0.0/h). - Significant oxygen desaturations were not observed during this titration (min O2 = 91.00%). - The patient snored with Moderate snoring volume during this titration study. - No cardiac abnormalities were observed during this study. - Moderate periodic limb movements were observed during this study. Arousals associated with PLMs were rare.  DIAGNOSIS - Obstructive Sleep Apnea (327.23 [G47.33 ICD-10])  RECOMMENDATIONS - Trial of CPAP therapy on 11 cm H2O with a Medium size Fisher&Paykel Full Face Mask Simplus mask and heated humidification. - Avoid alcohol, sedatives and other CNS depressants that may worsen sleep apnea and disrupt normal sleep architecture. - Sleep hygiene should be reviewed to assess factors that may improve sleep quality. - Weight management and regular exercise should be initiated or continued.  [Electronically signed] 08/26/2015 12:43 PM  Baird Lyons MD, [place], American Board of Sleep Medicine   NPI: [id number]  [place], American Board of Sleep Medicine  ELECTRONICALLY SIGNED ON:  08/26/2015, 12:40 PM [place] PH: [phone]   FX: [phone] [place]

## 2015-08-28 NOTE — Progress Notes (Signed)
Pls schedule her a f/u appt from her first visit and she just recently completed a sleep study.

## 2015-09-14 DIAGNOSIS — M25541 Pain in joints of right hand: Secondary | ICD-10-CM | POA: Insufficient documentation

## 2015-12-12 ENCOUNTER — Emergency Department (HOSPITAL_COMMUNITY)
Admission: EM | Admit: 2015-12-12 | Discharge: 2015-12-12 | Disposition: A | Discharge status: Home / Self Care | Payer: BLUE CROSS/BLUE SHIELD | Attending: Emergency Medicine | Admitting: Emergency Medicine

## 2015-12-12 ENCOUNTER — Encounter (HOSPITAL_COMMUNITY): Payer: Self-pay | Admitting: Emergency Medicine

## 2015-12-12 DIAGNOSIS — Y92410 Unspecified street and highway as the place of occurrence of the external cause: Secondary | ICD-10-CM | POA: Diagnosis not present

## 2015-12-12 DIAGNOSIS — Y939 Activity, unspecified: Secondary | ICD-10-CM | POA: Diagnosis not present

## 2015-12-12 DIAGNOSIS — S50811A Abrasion of right forearm, initial encounter: Secondary | ICD-10-CM | POA: Diagnosis not present

## 2015-12-12 DIAGNOSIS — Y999 Unspecified external cause status: Secondary | ICD-10-CM | POA: Diagnosis not present

## 2015-12-12 DIAGNOSIS — Z79899 Other long term (current) drug therapy: Secondary | ICD-10-CM | POA: Diagnosis not present

## 2015-12-12 DIAGNOSIS — S59911A Unspecified injury of right forearm, initial encounter: Secondary | ICD-10-CM | POA: Diagnosis present

## 2015-12-12 DIAGNOSIS — E119 Type 2 diabetes mellitus without complications: Secondary | ICD-10-CM | POA: Diagnosis not present

## 2015-12-12 DIAGNOSIS — Z794 Long term (current) use of insulin: Secondary | ICD-10-CM | POA: Diagnosis not present

## 2015-12-12 DIAGNOSIS — I10 Essential (primary) hypertension: Secondary | ICD-10-CM | POA: Diagnosis not present
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
## 2015-12-12 NOTE — Discharge Instructions (Signed)
Elevate arm to help with swelling Ice for 20 minutes at a time several times a day Take Tylenol for pain Follow up with family doctor if pain is getting worse
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
## 2016-02-02 ENCOUNTER — Telehealth: Payer: Self-pay | Admitting: Internal Medicine

## 2016-02-02 NOTE — Telephone Encounter (Signed)
Spoke with pt. States that she is needing her CPAP adjusted. We have not seen her in our office, CY only read her sleep study. Advised her that she would need to contact her PCP about this matter. She verbalized understanding. Nothing further was needed.

## 2016-04-01 DIAGNOSIS — R946 Abnormal results of thyroid function studies: Secondary | ICD-10-CM | POA: Insufficient documentation

## 2016-07-31 ENCOUNTER — Ambulatory Visit (INDEPENDENT_AMBULATORY_CARE_PROVIDER_SITE_OTHER): Payer: BLUE CROSS/BLUE SHIELD

## 2016-07-31 ENCOUNTER — Ambulatory Visit (INDEPENDENT_AMBULATORY_CARE_PROVIDER_SITE_OTHER): Payer: BLUE CROSS/BLUE SHIELD | Admitting: Family

## 2016-07-31 ENCOUNTER — Encounter (INDEPENDENT_AMBULATORY_CARE_PROVIDER_SITE_OTHER): Payer: Self-pay | Admitting: Family

## 2016-07-31 VITALS — Ht 66.0 in | Wt 272.0 lb

## 2016-07-31 DIAGNOSIS — G8929 Other chronic pain: Secondary | ICD-10-CM

## 2016-07-31 DIAGNOSIS — M5416 Radiculopathy, lumbar region: Secondary | ICD-10-CM | POA: Diagnosis not present

## 2016-07-31 DIAGNOSIS — M25561 Pain in right knee: Secondary | ICD-10-CM

## 2016-07-31 MED ORDER — PREDNISONE 10 MG PO TABS: ORAL_TABLET | ORAL | 0 refills | Status: DC

## 2016-07-31 MED ORDER — METHYLPREDNISOLONE ACETATE 40 MG/ML IJ SUSP
40.0000 mg | INTRAMUSCULAR | Status: AC | PRN
  Administered 2016-07-31: 40 mg via INTRA_ARTICULAR

## 2016-07-31 MED ORDER — LIDOCAINE HCL 1 % IJ SOLN
5.0000 mL | INTRAMUSCULAR | Status: AC | PRN
  Administered 2016-07-31: 5 mL

## 2016-07-31 NOTE — Progress Notes (Signed)
Office Visit Note   Patient: Chelsey Lee           Date of Birth: Feb 25, 1955           MRN: [id number] Visit Date: 07/31/2016              Requested by: Carlena Hurl, PA-C [address] [place], [place] [address] PCP: Willey Blade, MD  Chief Complaint  Patient presents with  . Right Knee - Pain      HPI: Patient is a 61 year old woman who presents today complaining of 2 separate issues. She is complaining of right-sided knee pain that is associated with swelling mainly medial pain and some lateral pain. Complaining of start up stiffness. She feels that her pain is worsened by ambulation. No pain at rest. Does not she has history of pathology in the right knee. States she did defer surgical options at that time. MRI from 2010 is noted below.  Him also complaining of radiating pain with associated with burning down the lateral right side. Wonders if this could be associated with her knee pain. Does have a history of sciatica did have an injection with Dr. Ernestina Patches about a year ago. Is recently had return of symptoms. Denies any numbness tingling in her foot and no weakness. Denies any back or buttock pain.  Assessment & Plan: Visit Diagnoses:  1. Chronic pain of right knee     Plan: We'll proceed with Depo-Medrol injection of the right knee today. Prescribed a dose pack for her lumbar spine sciatica pain. We'll have her follow up in office in 4 weeks for continued issues.   Follow-Up Instructions: No Follow-up on file.   Right Knee Exam   Tenderness  The patient is experiencing tenderness in the lateral joint line and medial joint line.  Range of Motion  The patient has normal right knee ROM.  Tests  Varus: negative Valgus: negative  Other  Erythema: absent Swelling: mild Other tests: no effusion present   Back Exam   Tenderness  The patient is experiencing tenderness in the lumbar.  Tests  Straight leg raise right: negative Straight leg raise  left: negative  Other  Erythema: no back redness      Patient is alert, oriented, no adenopathy, well-dressed, normal affect, normal respiratory effort.   Imaging: No results found. 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 Facet degenerative disease at this level results in unchanged 0.4 cm anterolisthesis.  New broad-based disc protrusion at L5-S1 results in narrowing in the lateral recesses, worse on the left. Left foraminal narrowing at this level does not appear changed.  MRI of right knee from 2010 showed  osteochondral defects involving tricompartmental degenerative changes of the right knee. She has medial and lateral meniscal tears with partial degenerative of the ACL.  Labs: No results found for: HGBA1C, ESRSEDRATE, CRP, LABURIC, REPTSTATUS, GRAMSTAIN, CULT, LABORGA  Orders:  Orders Placed This Encounter  Procedures  . XR Knee 1-2 Views Right   No orders of the defined types were placed in this encounter.    Procedures: Large Joint Inj Date/Time: 07/31/2016 2:08 PM Performed by: Dondra Prader R Authorized by: Dondra Prader R   Consent Given by:  Patient Site marked: the procedure site was marked   Timeout: prior to procedure the correct patient, procedure, and site was verified   Indications:  Pain and diagnostic evaluation Location:  Knee  Site:  R knee Needle Size:  22 G Needle Length:  1.5 inches Ultrasound Guidance: No   Fluoroscopic Guidance: No   Arthrogram: No   Medications:  5 mL lidocaine 1 %; 40 mg methylPREDNISolone acetate 40 MG/ML Aspiration Attempted: No   Patient tolerance:  Patient tolerated the procedure well with no immediate complications    Clinical Data: No additional findings.  ROS:  All other systems negative, except as noted in the HPI. Review of Systems  Constitutional: Negative for chills and fever.   Musculoskeletal: Positive for arthralgias, joint swelling and myalgias. Negative for back pain.  Neurological: Negative for weakness and numbness.    Objective: Vital Signs: Ht 5\' 6"  (1.676 m)   Wt 272 lb (123.4 kg)   BMI 43.90 kg/m   Specialty Comments:  No specialty comments available.  PMFS History: There are no active problems to display for this patient.  Past Medical History:  Diagnosis Date  . Arthritis   . Diabetes mellitus   . GERD (gastroesophageal reflux disease)   . Hyperlipemia   . Hypertension   . Seasonal allergies     No family history on file.  Past Surgical History:  Procedure Laterality Date  . CAPSULOTOMY  01/13/2012   Procedure: MINOR CAPSULOTOMY;  Surgeon: Myrtha Mantis., MD;  Location: [facility];  Service: Ophthalmology;  Laterality: Left;  . DORSAL COMPARTMENT RELEASE  2009   rt   . KNEE ARTHROSCOPY  02,04   right and left  . STERIOD INJECTION  08/01/2011   Procedure: STEROID INJECTION;  Surgeon: Cammie Sickle., MD;  Location: [facility];  Service: Orthopedics;  Laterality: Left;  cmc left thumb  . TRIGGER FINGER RELEASE  2006   rt thumb  . TRIGGER FINGER RELEASE  08/01/2011   Procedure: RELEASE TRIGGER FINGER/A-1 PULLEY;  Surgeon: Cammie Sickle., MD;  Location: [facility];  Service: Orthopedics;  Laterality: Left;  left thumb  . YAG LASER APPLICATION  54/49/2010   Procedure: YAG LASER APPLICATION;  Surgeon: Myrtha Mantis., MD;  Location: [facility];  Service: Ophthalmology;  Laterality: Left;   Social History   Occupational History  . Not on file.   Social History Main Topics  . Smoking status: Never Smoker  . Smokeless tobacco: Never Used  . Alcohol use No  . Drug use: No  . Sexual activity: Not on file

## 2016-08-21 DIAGNOSIS — K900 Celiac disease: Secondary | ICD-10-CM | POA: Insufficient documentation

## 2016-08-28 ENCOUNTER — Ambulatory Visit (INDEPENDENT_AMBULATORY_CARE_PROVIDER_SITE_OTHER): Payer: BLUE CROSS/BLUE SHIELD | Admitting: Family

## 2016-08-28 ENCOUNTER — Encounter (INDEPENDENT_AMBULATORY_CARE_PROVIDER_SITE_OTHER): Payer: Self-pay | Admitting: Family

## 2016-08-28 VITALS — Ht 66.0 in | Wt 272.0 lb

## 2016-08-28 DIAGNOSIS — M1711 Unilateral primary osteoarthritis, right knee: Secondary | ICD-10-CM | POA: Diagnosis not present

## 2016-08-28 DIAGNOSIS — M5416 Radiculopathy, lumbar region: Secondary | ICD-10-CM

## 2016-08-29 NOTE — Progress Notes (Signed)
Office Visit Note   Patient: Chelsey Lee           Date of Birth: 1955/12/17           MRN: [id number] Visit Date: 08/28/2016              Requested by: Willey Blade, [address] [place], [place] [address] PCP: Willey Blade, MD  Chief Complaint  Patient presents with  . Lower Back - Pain    pred taper and injection of knee 07/31/16 not helpful  . Right Knee - Pain      HPI: Patient is a 61 year old woman who presents today complaining of 2 separate issues. She is complaining of right-sided knee pain that is associated with swelling mainly medial pain and some lateral pain. Complaining of start up stiffness. She feels that her pain is worsened by ambulation. No pain at rest. Does not she has history of medial and lateral meniscal tears, right knee. States she did defer surgical options in 2010 when this occurred. Recent radiographs show significant medial joint space narrowing.   Underwent Depomedrol injection right knee 4 weeks ago with no relief.   Also complaining of radiating pain with associated with burning down the lateral right side. Wonders if this could be associated with her knee pain. Does have a history of sciatica, did have an injection with Dr. Ernestina Patches about a year ago. Has recently had return of symptoms. Denies any numbness tingling in her foot and no weakness. Denies any back or buttock pain.  Has trialed a prednisone taper for this with no relief.  Assessment & Plan: Visit Diagnoses:  No diagnosis found.  Plan: Will order monovisc for the right knee. Refer to [facility] for [place] [facility]spine.   Follow-Up Instructions: No Follow-up on file.   Right Knee Exam   Tenderness  The patient is experiencing tenderness in the lateral joint line and medial joint line.  Range of Motion  The patient has normal right knee ROM.  Tests  Varus: negative Valgus: negative  Other  Erythema: absent Swelling: mild Other tests: no effusion  present   Back Exam   Tenderness  The patient is experiencing tenderness in the lumbar.  Tests  Straight leg raise right: negative Straight leg raise left: negative  Other  Erythema: no back redness      Patient is alert, oriented, no adenopathy, well-dressed, normal affect, normal respiratory effort.   Imaging: No results found. 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 Facet degenerative disease at this level results in unchanged 0.4 cm anterolisthesis.  New broad-based disc protrusion at L5-S1 results in narrowing in the lateral recesses, worse on the left. Left foraminal narrowing at this level does not appear changed.  MRI of right knee from 2010 showed  osteochondral defects involving tricompartmental degenerative changes of the right knee. She has medial and lateral meniscal tears with partial degenerative of the ACL.  Labs: No results found for: HGBA1C, ESRSEDRATE, CRP, LABURIC, REPTSTATUS, GRAMSTAIN, CULT, LABORGA  Orders:  No orders of the defined types were placed in this encounter.  No orders of the defined types were placed in this encounter.    Procedures: No procedures performed  Clinical Data: No additional findings.  ROS:  All other systems negative, except as noted in the HPI. Review of Systems  Constitutional: Negative for chills and fever.  Musculoskeletal: Positive for  arthralgias, joint swelling and myalgias. Negative for back pain.  Neurological: Negative for weakness and numbness.    Objective: Vital Signs: Ht 5\' 6"  (1.676 m)   Wt 272 lb (123.4 kg)   BMI 43.90 kg/m   Specialty Comments:  No specialty comments available.  PMFS History: There are no active problems to display for this patient.  Past Medical History:  Diagnosis Date  . Arthritis   . Diabetes mellitus   . GERD (gastroesophageal reflux  disease)   . Hyperlipemia   . Hypertension   . Seasonal allergies     No family history on file.  Past Surgical History:  Procedure Laterality Date  . CAPSULOTOMY  01/13/2012   Procedure: MINOR CAPSULOTOMY;  Surgeon: Myrtha Mantis., MD;  Location: [facility];  Service: Ophthalmology;  Laterality: Left;  . DORSAL COMPARTMENT RELEASE  2009   rt   . KNEE ARTHROSCOPY  02,04   right and left  . STERIOD INJECTION  08/01/2011   Procedure: STEROID INJECTION;  Surgeon: Cammie Sickle., MD;  Location: [facility];  Service: Orthopedics;  Laterality: Left;  cmc left thumb  . TRIGGER FINGER RELEASE  2006   rt thumb  . TRIGGER FINGER RELEASE  08/01/2011   Procedure: RELEASE TRIGGER FINGER/A-1 PULLEY;  Surgeon: Cammie Sickle., MD;  Location: [facility];  Service: Orthopedics;  Laterality: Left;  left thumb  . YAG LASER APPLICATION  02/54/2706   Procedure: YAG LASER APPLICATION;  Surgeon: Myrtha Mantis., MD;  Location: [facility];  Service: Ophthalmology;  Laterality: Left;   Social History   Occupational History  . Not on file.   Social History Main Topics  . Smoking status: Never Smoker  . Smokeless tobacco: Never Used  . Alcohol use No  . Drug use: No  . Sexual activity: Not on file

## 2016-09-02 ENCOUNTER — Telehealth (INDEPENDENT_AMBULATORY_CARE_PROVIDER_SITE_OTHER): Payer: Self-pay | Admitting: Family

## 2016-09-03 NOTE — Telephone Encounter (Signed)
ERROR

## 2016-09-04 ENCOUNTER — Telehealth (INDEPENDENT_AMBULATORY_CARE_PROVIDER_SITE_OTHER): Payer: Self-pay | Admitting: Family

## 2016-09-04 NOTE — Telephone Encounter (Signed)
Patient called advised her insurance approved her getting the monovisc injection. Patient said the insurance company is waiting for a call from [facility] to give a medical number that is needed from the doctor. The number to contact patient is [phone]

## 2016-09-05 NOTE — Telephone Encounter (Signed)
I called and left vm, advised patient monovisc normally wont approve [facility]. And so her request was sent to [facility] instead, which is comparable. A signed script was needing to be faxed and this was faxed today.

## 2016-09-11 ENCOUNTER — Telehealth (INDEPENDENT_AMBULATORY_CARE_PROVIDER_SITE_OTHER): Payer: Self-pay

## 2016-09-11 ENCOUNTER — Telehealth (INDEPENDENT_AMBULATORY_CARE_PROVIDER_SITE_OTHER): Payer: Self-pay | Admitting: Orthopedic Surgery

## 2016-09-11 NOTE — Telephone Encounter (Signed)
Patient called and said that the synvisc injection was approved through her insurance Nurse, mental health) and that they were just needing a code from Dr. Sharol Given to proceed. If you could give her a call back as soon as possible. Thank you. CB # [id number]

## 2016-09-11 NOTE — Telephone Encounter (Signed)
Patient was calling to check the status of approval for Synv[facility] Injection.  CB# is [id number].  Please advise. Thank You.

## 2016-09-11 NOTE — Telephone Encounter (Signed)
Pulled up [facility] portal and the last entry from them is rx is processing and to call [facility] [phone]. I called and sw Dee. She advised that the rx has been received and that this is awaiting benefits check with their office and once that has been done then they can release the injections to the office after contacting the pt for approval. I have messaged [facility] and asked if the benefits verification is not send with the rx to the speciality pharmacy during initial enrollment. I am awaiting reply.

## 2016-09-11 NOTE — Telephone Encounter (Signed)
A better phone number to reach her would be [phone]

## 2016-09-12 ENCOUNTER — Encounter (INDEPENDENT_AMBULATORY_CARE_PROVIDER_SITE_OTHER): Payer: Self-pay | Admitting: Physical Medicine and Rehabilitation

## 2016-09-12 ENCOUNTER — Ambulatory Visit (INDEPENDENT_AMBULATORY_CARE_PROVIDER_SITE_OTHER): Payer: BLUE CROSS/BLUE SHIELD

## 2016-09-12 ENCOUNTER — Ambulatory Visit (INDEPENDENT_AMBULATORY_CARE_PROVIDER_SITE_OTHER): Payer: BLUE CROSS/BLUE SHIELD | Admitting: Physical Medicine and Rehabilitation

## 2016-09-12 VITALS — BP 137/76 | HR 69

## 2016-09-12 DIAGNOSIS — M48062 Spinal stenosis, lumbar region with neurogenic claudication: Secondary | ICD-10-CM | POA: Diagnosis not present

## 2016-09-12 DIAGNOSIS — M5416 Radiculopathy, lumbar region: Secondary | ICD-10-CM

## 2016-09-12 DIAGNOSIS — M5116 Intervertebral disc disorders with radiculopathy, lumbar region: Secondary | ICD-10-CM | POA: Diagnosis not present

## 2016-09-12 MED ORDER — LIDOCAINE HCL (PF) 1 % IJ SOLN
2.0000 mL | Freq: Once | INTRAMUSCULAR | Status: AC
  Administered 2016-09-12: 2 mL

## 2016-09-12 MED ORDER — BETAMETHASONE SOD PHOS & ACET 6 (3-3) MG/ML IJ SUSP
12.0000 mg | Freq: Once | INTRAMUSCULAR | Status: AC
  Administered 2016-09-12: 12 mg

## 2016-09-12 NOTE — Progress Notes (Deleted)
Right side low back pain radiating down right leg to knee. Says its a constant soreness in leg and sharp shooting pains at times. Occasional  tingling down the side of leg.

## 2016-09-12 NOTE — Patient Instructions (Signed)

## 2016-09-13 NOTE — Procedures (Signed)
Chelsey Lee is a 61 year old female followed by Dr. 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 She reports right-sided low back pain radiating to the leg and knee and more of an L5 distribution and L4 potentially. She says the pain is constant with soreness and aching pains. She does get sharp shooting electrical pain at times. Occasional tingling down to the side of the leg laterally. She has severe stenosis. She seems to lack some insight into this and we tried to go over this several times with her. Unfortunately she may be a surgical candidate at some point. Her case is complicated by diabetes. Last injection was about a year ago.  Lumbosacral Transforaminal Epidural Steroid Injection - Sub-Pedicular Approach with Fluoroscopic Guidance  Patient: Chelsey Lee      Date of Birth: [birth year] MRN: [id number] PCP: Willey Blade, MD      Visit Date: 09/12/2016   Universal Protocol:    Date/Time: 09/12/2016  Consent Given By: the patient  Position: PRONE  Additional Comments: Vital signs were monitored before and after the procedure. Patient was prepped and draped in the usual sterile fashion. The correct patient, procedure, and site was verified.   Injection Procedure Details:  Procedure Site One Meds Administered:  Meds ordered this encounter  Medications  . lidocaine (PF) (XYLOCAINE) 1 % injection 2 mL  . betamethasone acetate-betamethasone sodium phosphate (CELESTONE) injection 12 mg    Laterality: Right  Location/Site:  L4-L5  Needle size: 22 G  Needle type: Spinal  Needle Placement: Transforaminal  Findings:  -Contrast Used: 1 mL iohexol 180 mg iodine/mL   -Comments: Excellent flow of contrast along the nerve and into the epidural space.  Procedure Details: After squaring off the end-plates to get a true AP view, the C-arm was positioned so that an oblique view of the foramen as noted above was  visualized. The target area is just inferior to the "nose of the scotty dog" or sub pedicular. The soft tissues overlying this structure were infiltrated with 2-3 ml. of 1% Lidocaine without Epinephrine.  The spinal needle was inserted toward the target using a "trajectory" view along the fluoroscope beam.  Under AP and lateral visualization, the needle was advanced so it did not puncture dura and was located close the 6 O'Clock position of the pedical in AP tracterory. Biplanar projections were used to confirm position. Aspiration was confirmed to be negative for CSF and/or blood. A 1-2 ml. volume of Isovue-250 was injected and flow of contrast was noted at each level. Radiographs were obtained for documentation purposes.   After attaining the desired flow of contrast documented above, a 0.5 to 1.0 ml test dose of 0.25% Marcaine was injected into each respective transforaminal space.  The patient was observed for 90 seconds post injection.  After no sensory deficits were reported, and normal lower extremity motor function was noted,   the above injectate was administered so that equal amounts of the injectate were placed at each foramen (level) into the transforaminal epidural space.   Additional Comments:  The patient tolerated the procedure well Dressing: Band-Aid    Post-procedure details: Patient was observed during the procedure. Post-procedure instructions were reviewed.  Patient left the clinic in stable condition.

## 2016-09-13 NOTE — Telephone Encounter (Signed)
Cancelled rx through specialty pharmacy and we will just buy and bill both codes covered at 100% according to [facility]. Patient advised she will call us back to get an appointment for next week with MD or EZ for injection right knee.

## 2016-09-16 ENCOUNTER — Telehealth (INDEPENDENT_AMBULATORY_CARE_PROVIDER_SITE_OTHER): Payer: Self-pay | Admitting: Orthopedic Surgery

## 2016-09-16 NOTE — Telephone Encounter (Signed)
I had a long discussion with patient over the phone. She would like to know if she could be sedated, she typically gets sedated for dental procedure. She would like to not feel anything. I told her this was unrealistic that synvisc one injection is large volume of material going into her knee at once and is a thicker material, so for her to not even feel any pressure would not be likely. Advised her to reconsider the injection. She would like to be medicated that day. I advised I could ask about something for anxiety, she is insisting she has some pain medication to take on day she has injection. We also went over what she could wear for injection and that she would most likely have to take it easy day of injection and advance activities as tolerated. Please advise about pain medication day of injection.

## 2016-09-16 NOTE — Telephone Encounter (Signed)
Patient called asked for a call back concerning the injection. Patient have a question concerning the injection. The number to contact  patient is [phone]

## 2016-09-17 NOTE — Telephone Encounter (Signed)
I called and left voicemail for patient. Advised Dr. Sharol Given would write rx for valium, she would take 30 minutes prior to appointment. She would require a driver. Advised her to call us back and let us know which pharmacy she wants to use.

## 2016-09-17 NOTE — Telephone Encounter (Signed)
Call in the prescription for her for 3 Valium and she needs a driver. She can take the Valium prior to the injection. Recommend against narcotics.

## 2016-09-19 ENCOUNTER — Ambulatory Visit (INDEPENDENT_AMBULATORY_CARE_PROVIDER_SITE_OTHER): Payer: BLUE CROSS/BLUE SHIELD | Admitting: Orthopedic Surgery

## 2016-10-03 ENCOUNTER — Encounter (HOSPITAL_COMMUNITY): Payer: Self-pay | Admitting: Emergency Medicine

## 2016-10-03 ENCOUNTER — Ambulatory Visit (HOSPITAL_COMMUNITY)
Admission: EM | Admit: 2016-10-03 | Discharge: 2016-10-03 | Disposition: A | Discharge status: Home / Self Care | Payer: BLUE CROSS/BLUE SHIELD | Attending: Family Medicine | Admitting: Family Medicine

## 2016-10-03 ENCOUNTER — Encounter (INDEPENDENT_AMBULATORY_CARE_PROVIDER_SITE_OTHER): Payer: Self-pay | Admitting: Orthopedic Surgery

## 2016-10-03 ENCOUNTER — Ambulatory Visit (INDEPENDENT_AMBULATORY_CARE_PROVIDER_SITE_OTHER): Payer: BLUE CROSS/BLUE SHIELD

## 2016-10-03 ENCOUNTER — Ambulatory Visit (INDEPENDENT_AMBULATORY_CARE_PROVIDER_SITE_OTHER): Payer: BLUE CROSS/BLUE SHIELD | Admitting: Orthopedic Surgery

## 2016-10-03 DIAGNOSIS — Z23 Encounter for immunization: Secondary | ICD-10-CM

## 2016-10-03 DIAGNOSIS — S60454A Superficial foreign body of right ring finger, initial encounter: Secondary | ICD-10-CM

## 2016-10-03 DIAGNOSIS — M795 Residual foreign body in soft tissue: Secondary | ICD-10-CM | POA: Diagnosis not present

## 2016-10-03 DIAGNOSIS — Z1833 Retained wood fragments: Secondary | ICD-10-CM

## 2016-10-03 DIAGNOSIS — M1711 Unilateral primary osteoarthritis, right knee: Secondary | ICD-10-CM

## 2016-10-03 MED ORDER — TETANUS-DIPHTH-ACELL PERTUSSIS 5-2.5-18.5 LF-MCG/0.5 IM SUSP
INTRAMUSCULAR | Status: AC
  Filled 2016-10-03: qty 0.5

## 2016-10-03 MED ORDER — HYDROCODONE-ACETAMINOPHEN 5-325 MG PO TABS
ORAL_TABLET | ORAL | Status: AC
  Filled 2016-10-03: qty 1

## 2016-10-03 MED ORDER — HYDROCODONE-ACETAMINOPHEN 5-325 MG PO TABS: 1.0000 | ORAL_TABLET | ORAL | 0 refills | Status: DC | PRN

## 2016-10-03 MED ORDER — HYALURONAN 88 MG/4ML IX SOSY
88.0000 mg | PREFILLED_SYRINGE | INTRA_ARTICULAR | Status: AC | PRN
  Administered 2016-10-03: 88 mg via INTRA_ARTICULAR

## 2016-10-03 MED ORDER — LIDOCAINE HCL 1 % IJ SOLN
1.0000 mL | INTRAMUSCULAR | Status: AC | PRN
  Administered 2016-10-03: 1 mL

## 2016-10-03 MED ORDER — SODIUM BICARBONATE 4 % IV SOLN
INTRAVENOUS | Status: AC
  Filled 2016-10-03: qty 5

## 2016-10-03 MED ORDER — LIDOCAINE HCL 2 % IJ SOLN
INTRAMUSCULAR | Status: AC
  Filled 2016-10-03: qty 20

## 2016-10-03 MED ORDER — TETANUS-DIPHTH-ACELL PERTUSSIS 5-2.5-18.5 LF-MCG/0.5 IM SUSP
0.5000 mL | Freq: Once | INTRAMUSCULAR | Status: AC
  Administered 2016-10-03: 0.5 mL via INTRAMUSCULAR

## 2016-10-03 MED ORDER — AMOXICILLIN-POT CLAVULANATE 875-125 MG PO TABS: 1.0000 | ORAL_TABLET | Freq: Two times a day (BID) | ORAL | 0 refills | Status: DC

## 2016-10-03 MED ORDER — HYDROCODONE-ACETAMINOPHEN 5-325 MG PO TABS
1.0000 | ORAL_TABLET | Freq: Once | ORAL | Status: AC
  Administered 2016-10-03: 1 via ORAL

## 2016-10-03 NOTE — ED Triage Notes (Signed)
Pt has a large splinter under the nail bed of her right ring finger that she got today from the wood paneling on the wall in her house.  She is overdue for a DTaP injection.  Her las one was in 2007.

## 2016-10-03 NOTE — ED Provider Notes (Signed)
[facility]   [id number] 10/03/16 Arrival Time: 1420   SUBJECTIVE:  Chelsey Lee is a 61 y.o. female who presents to the urgent care with complaint of splinter under the nail bed of her right ring finger. States she was lifting wood paneling from a lawn her house, when this fell on her hand, she states she is in significant pain, also, she is overdue for tetanus vaccine last 2007     Past Medical History:  Diagnosis Date  . Arthritis   . Diabetes mellitus   . GERD (gastroesophageal reflux disease)   . Hyperlipemia   . Hypertension   . Seasonal allergies    History reviewed. No pertinent family history. Social History   Social History  . Marital status: Single    Spouse name: N/A  . Number of children: N/A  . Years of education: N/A   Occupational History  . Not on file.   Social History Main Topics  . Smoking status: Never Smoker  . Smokeless tobacco: Never Used  . Alcohol use No  . Drug use: No  . Sexual activity: Not on file   Other Topics Concern  . Not on file   Social History Narrative  . No narrative on file   Current Meds  Medication Sig  . atorvastatin (LIPITOR) 20 MG tablet Take 20 mg by mouth daily.  . carvedilol (COREG) 25 MG tablet Take 25 mg by mouth 2 (two) times daily with a meal.  . escitalopram (LEXAPRO) 10 MG tablet Take 10 mg by mouth daily.  . fenofibrate 160 MG tablet Take 160 mg by mouth daily.  . furosemide (LASIX) 80 MG tablet Take 80 mg by mouth daily.   . insulin lispro protamine-lispro (HUMALOG 50/50 MIX) (50-50) 100 UNIT/ML SUSP injection Inject 60 Units into the skin 2 (two) times daily before a meal.   . liraglutide (VICTOZA) 18 MG/3ML SOPN Inject 1.2 mg into the skin.  . phentermine (ADIPEX-P) 37.5 MG tablet phentermine 37.5 mg tablet  Take 1 tablet every day by oral route.   No Known Allergies    ROS: As per HPI, remainder of ROS negative.   OBJECTIVE:   Vitals:   10/03/16 1501  BP: (!) 160/72    Pulse: 68  Temp: 98 F (36.7 C)  TempSrc: Oral  SpO2: 100%     General appearance: alert; no distress Eyes: conjunctiva normal HENT: normocephalic; atraumatic; Extremities: Proximately 1-1/2 cm wooden splinter under the nail bed of the right fourth finger, see attached photograph  Skin: warm and dry Neurologic: normal gait; grossly normal Psychological: alert and cooperative; normal mood and affect        Labs:   Labs Reviewed - No data to display  Dg Finger Ring Right  Result Date: 10/03/2016 CLINICAL DATA:  Per pt: picking up wood next to the wall, a some wood went up under the right ring finger's nail. Foreign object (wood) has been removed. X-rays to see if there is any other foreign bodies left under nail. Patient is a diabetic EXAM: RIGHT RING FINGER 2+V COMPARISON:  None. FINDINGS: No fracture.  The joints are normally aligned.  No bone lesion. No radiopaque foreign body. Subtle vascular calcification noted along the medial mid finger. Soft tissues are otherwise unremarkable. IMPRESSION: 1. No evidence of a radiopaque foreign body. 2. No fracture or dislocation. Electronically Signed   By: Lajean Manes M.D.   On: 10/03/2016 16:03       ASSESSMENT & PLAN:  1. Foreign body (FB) in soft tissue     Meds ordered this encounter  Medications  . fenofibrate 160 MG tablet    Sig: Take 160 mg by mouth daily.  Marland Kitchen HYDROcodone-acetaminophen (NORCO/VICODIN) 5-325 MG per tablet 1 tablet  . Tdap (BOOSTRIX) injection 0.5 mL  . amoxicillin-clavulanate (AUGMENTIN) 875-125 MG tablet    Sig: Take 1 tablet by mouth 2 (two) times daily.    Dispense:  14 tablet    Refill:  0  . HYDROcodone-acetaminophen (NORCO/VICODIN) 5-325 MG tablet    Sig: Take 1 tablet by mouth every 4 (four) hours as needed.    Dispense:  10 tablet    Refill:  0     [place] controlled substances reporting system consulted prior to issuing prescription, with the following findings  below:  Regular, monthly prescriptions of phentermine, only 1 prescription for opioid analgesics written in April, for 2 day supply.  Reviewed expectations re: course of current medical issues. Questions answered. Outlined signs and symptoms indicating need for more acute intervention. Patient verbalized understanding. After Visit Summary given.    Procedures:   Foreign body removal:  In for the procedure was obtained after explaining risks versus benefits including infection, pain, poor cosmetic result, and retained foreign body. Finger was cleansed with Betadine, anatomical landmarks were identified, and using sterile technique, a digital nerve block was performed on the fourth digit of the right hand. Adequate analgesia was produced. Nail was elevated using hemostats, a small incision made into the nail using blunt tipped scissors to facilitate extraction. Splinter was gripped with forceps, and smooth steady pressure applied to remove the object. Object appears intact, steady pressure was held to control bleeding. Patient tolerated procedure well, finger was bandaged. X-rays to be obtained to check for retained foreign body.     Barnet Glasgow, NP 10/03/16 2121

## 2016-10-03 NOTE — Discharge Instructions (Signed)
Keep your wound clean and dry, keep it covered as well. Starting on antibiotics, take one tablet of amoxicillin today. I have also prescribed a medicine for pain called hydrocodone, this medicine is a narcotic, it will cause drowsiness, and it is addictive. Do not take more than what is necessary, do not drink alcohol while taking, and do not operate any heavy machinery while taking this medicine. If needed, you may return to clinic in 2 days for a wound recheck, if you have any signs or symptoms of infection, return sooner. If you see any red streaking up the finger or arm, go to the emergency room as soon as possible

## 2016-10-03 NOTE — Progress Notes (Signed)
Office Visit Note   Patient: Chelsey Lee           Date of Birth: [birth year]           MRN: [id number] Visit Date: 10/03/2016              Requested by: Willey Blade, [address] [place], [place] [address] PCP: Willey Blade, MD  Chief Complaint  Patient presents with  . Right Knee - Pain      HPI: Patient presents for evaluation for osteoarthritis right knee. She has had temporary relief with steroid injections still has pain with activities of daily living.  Assessment & Plan: Visit Diagnoses:  1. Primary osteoarthritis of right knee     Plan: Right knee was injected with Synvisc one without complications.  Follow-Up Instructions: Return if symptoms worsen or fail to improve.   Ortho Exam  Patient is alert, oriented, no adenopathy, well-dressed, normal affect, normal respiratory effort. Semination there is no redness no cellulitis no effusion of the right knee she is tender to palpation medial lateral joint lines) cruciate are stable there is no synovitis no signs of gout.  Imaging: No results found. No images are attached to the encounter.  Labs: No results found for: HGBA1C, ESRSEDRATE, CRP, LABURIC, REPTSTATUS, GRAMSTAIN, CULT, LABORGA  Orders:  No orders of the defined types were placed in this encounter.  No orders of the defined types were placed in this encounter.    Procedures: Large Joint Inj Date/Time: 10/03/2016 11:00 AM Performed by: Santos Hardwick V Authorized by: Newt Minion   Consent Given by:  Patient Site marked: the procedure site was marked   Timeout: prior to procedure the correct patient, procedure, and site was verified   Indications:  Pain and diagnostic evaluation Location:  Knee Site:  R knee Prep: patient was prepped and draped in usual sterile fashion   Needle Size:  22 G Needle Length:  1.5 inches Approach:  Anteromedial Ultrasound Guidance: No   Fluoroscopic Guidance: No   Arthrogram: No     Medications:  88 mg Hyaluronan 88 MG/4ML; 1 mL lidocaine 1 % Aspiration Attempted: No   Patient tolerance:  Patient tolerated the procedure well with no immediate complications     Clinical Data: No additional findings.  ROS:  All other systems negative, except as noted in the HPI. Review of Systems  Objective: Vital Signs: There were no vitals taken for this visit.  Specialty Comments:  No specialty comments available.  PMFS History: There are no active problems to display for this patient.  Past Medical History:  Diagnosis Date  . Arthritis   . Diabetes mellitus   . GERD (gastroesophageal reflux disease)   . Hyperlipemia   . Hypertension   . Seasonal allergies     History reviewed. No pertinent family history.  Past Surgical History:  Procedure Laterality Date  . CAPSULOTOMY  01/13/2012   Procedure: MINOR CAPSULOTOMY;  Surgeon: Myrtha Mantis., MD;  Location: [facility];  Service: Ophthalmology;  Laterality: Left;  . DORSAL COMPARTMENT RELEASE  2009   rt   . KNEE ARTHROSCOPY  02,04   right and left  . STERIOD INJECTION  08/01/2011   Procedure: STEROID INJECTION;  Surgeon: Cammie Sickle., MD;  Location: [facility];  Service: Orthopedics;  Laterality: Left;  cmc left thumb  . TRIGGER FINGER RELEASE  2006   rt thumb  . TRIGGER FINGER RELEASE  08/01/2011  Procedure: RELEASE TRIGGER FINGER/A-1 PULLEY;  Surgeon: Cammie Sickle., MD;  Location: [facility];  Service: Orthopedics;  Laterality: Left;  left thumb  . YAG LASER APPLICATION  67/42/5525   Procedure: YAG LASER APPLICATION;  Surgeon: Myrtha Mantis., MD;  Location: [facility];  Service: Ophthalmology;  Laterality: Left;   Social History   Occupational History  . Not on file.   Social History Main Topics  . Smoking status: Never Smoker  . Smokeless tobacco: Never Used  . Alcohol use No  . Drug use: No  . Sexual activity: Not on file

## 2017-02-13 ENCOUNTER — Ambulatory Visit (INDEPENDENT_AMBULATORY_CARE_PROVIDER_SITE_OTHER): Payer: BLUE CROSS/BLUE SHIELD | Admitting: Orthopedic Surgery

## 2017-02-13 ENCOUNTER — Encounter (INDEPENDENT_AMBULATORY_CARE_PROVIDER_SITE_OTHER): Payer: Self-pay | Admitting: Orthopedic Surgery

## 2017-02-13 ENCOUNTER — Ambulatory Visit (INDEPENDENT_AMBULATORY_CARE_PROVIDER_SITE_OTHER): Payer: BLUE CROSS/BLUE SHIELD

## 2017-02-13 VITALS — Ht 66.0 in | Wt 272.0 lb

## 2017-02-13 DIAGNOSIS — M25552 Pain in left hip: Secondary | ICD-10-CM

## 2017-02-13 NOTE — Progress Notes (Signed)
Office Visit Note   Patient: Chelsey Lee           Date of Birth: Jun 03, 1955           MRN: [id number] Visit Date: 02/13/2017              Requested by: Willey Blade, [address] [place], [place] [address] PCP: Willey Blade, MD  Chief Complaint  Patient presents with  . Left Hip - Follow-up      HPI: Patient is a 62 year old woman who presents complaining of left groin and posterior left hip pain.  She states the pain is worse with walking denies pain with sitting or lying down she denies any sleep disturbance.  She states the pain is been going on for about 3 weeks.  Assessment & Plan: Visit Diagnoses:  1. Pain in left hip     Plan: Patient's pain seems to be coming from her hip we will refer her to Dr. Ernestina Patches for a left hip injection to see if this relieves her symptoms if this does not help we would need to follow-up with further lumbar spine studies.  Patient has had epidural steroid injections for lumbar spine in the past.  Follow-Up Instructions: Return if symptoms worsen or fail to improve.   Ortho Exam  Patient is alert, oriented, no adenopathy, well-dressed, normal affect, normal respiratory effort. Examination patient does have an abductor lurch and swings over the left hip.  She has asymmetric range of motion of both hips knees and ankles has a negative straight leg raise on the left with no focal motor changes.  Imaging: Xr Hip Unilat W Or W/o Pelvis 2-3 Views Left  Result Date: 02/13/2017 2 view radiographs of the left hip shows decreased joint space with significant soft tissue overlying the bony structures.  No evidence of a pelvic fracture.  No images are attached to the encounter.  Labs: No results found for: HGBA1C, ESRSEDRATE, CRP, LABURIC, REPTSTATUS, GRAMSTAIN, CULT, LABORGA  @LABSALLVALUES (HGBA1)@  Body mass index is 43.9 kg/m.  Orders:  Orders Placed This Encounter  Procedures  . XR HIP UNILAT W OR W/O PELVIS  2-3 VIEWS LEFT  . Ambulatory referral to Physical Medicine Rehab   No orders of the defined types were placed in this encounter.    Procedures: No procedures performed  Clinical Data: No additional findings.  ROS:  All other systems negative, except as noted in the HPI. Review of Systems  Objective: Vital Signs: Ht 5\' 6"  (1.676 m)   Wt 272 lb (123.4 kg)   BMI 43.90 kg/m   Specialty Comments:  No specialty comments available.  PMFS History: There are no active problems to display for this patient.  Past Medical History:  Diagnosis Date  . Arthritis   . Diabetes mellitus   . GERD (gastroesophageal reflux disease)   . Hyperlipemia   . Hypertension   . Seasonal allergies     History reviewed. No pertinent family history.  Past Surgical History:  Procedure Laterality Date  . CAPSULOTOMY  01/13/2012   Procedure: MINOR CAPSULOTOMY;  Surgeon: Myrtha Mantis., MD;  Location: [facility];  Service: Ophthalmology;  Laterality: Left;  . DORSAL COMPARTMENT RELEASE  2009   rt   . KNEE ARTHROSCOPY  02,04   right and left  . STERIOD INJECTION  08/01/2011   Procedure: STEROID INJECTION;  Surgeon: Cammie Sickle., MD;  Location: [facility];  Service: Orthopedics;  Laterality: Left;  cmc left thumb  . TRIGGER FINGER RELEASE  2006   rt thumb  . TRIGGER FINGER RELEASE  08/01/2011   Procedure: RELEASE TRIGGER FINGER/A-1 PULLEY;  Surgeon: Cammie Sickle., MD;  Location: [facility];  Service: Orthopedics;  Laterality: Left;  left thumb  . YAG LASER APPLICATION  16/10/9602   Procedure: YAG LASER APPLICATION;  Surgeon: Myrtha Mantis., MD;  Location: [facility];  Service: Ophthalmology;  Laterality: Left;   Social History   Occupational History  . Not on file  Tobacco Use  . Smoking status: Never Smoker  . Smokeless tobacco: Never Used  Substance and Sexual Activity  . Alcohol use: No  . Drug use: No  . Sexual activity: Not on file

## 2017-02-25 ENCOUNTER — Encounter (INDEPENDENT_AMBULATORY_CARE_PROVIDER_SITE_OTHER): Payer: Self-pay | Admitting: Physical Medicine and Rehabilitation

## 2017-02-25 ENCOUNTER — Ambulatory Visit (INDEPENDENT_AMBULATORY_CARE_PROVIDER_SITE_OTHER): Payer: BLUE CROSS/BLUE SHIELD | Admitting: Physical Medicine and Rehabilitation

## 2017-02-25 ENCOUNTER — Ambulatory Visit (INDEPENDENT_AMBULATORY_CARE_PROVIDER_SITE_OTHER): Payer: Self-pay

## 2017-02-25 DIAGNOSIS — M25552 Pain in left hip: Secondary | ICD-10-CM

## 2017-02-25 NOTE — Progress Notes (Deleted)
Pt states a sharp pain in left hip. Pt states pain has been going on for about 3 weeks. Pt states bending over while standing or sitting makes pain worse, nothing makes better. -Dye Allergies.

## 2017-02-25 NOTE — Progress Notes (Signed)
VANIYAH LANSKY - 62 y.o. female MRN [id number]  Date of birth: 09/07/1955  Office Visit Note: Visit Date: 02/25/2017 PCP: Willey Blade, MD Referred by: Willey Blade, MD  Subjective: Chief Complaint  Patient presents with  . Left Hip - Pain   HPI: Mrs. Chervenak is a 62 year old followed by Dr. Sharol Given for her orthopedic care.  We have seen her on occasion for her low back and lumbar spine.  She comes in today with left hip and groin pain this been going on for several weeks but significantly worsening over the last 3 weeks.  She does not report any specific injury.  Dr. Sharol Given evaluated her with x-rays and exam and felt like this may be related to her hip.  We will complete a diagnostic and hopefully therapeutic anesthetic hip arthrogram on the left.  She reports bending over and sitting makes the pain worse.  She has not found anything to make the pain any better.    ROS Otherwise per HPI.  Assessment & Plan: Visit Diagnoses:  1. Pain in left hip     Plan: Findings:  Diagnostic and therapeutic anesthetic hip arthrogram on the left.  Patient did have relief during the anesthetic phase.    Meds & Orders: No orders of the defined types were placed in this encounter.   Orders Placed This Encounter  Procedures  . Large Joint Inj: L hip joint  . XR C-ARM NO REPORT    Follow-up: Return if symptoms worsen or fail to improve, for Dr. Sharol Given.   Procedures: Large Joint Inj: L hip joint on 02/25/2017 3:15 PM Indications: pain and diagnostic evaluation Details: 22 G needle, anterior approach  Arthrogram: Yes  Medications: 80 mg triamcinolone acetonide 40 MG/ML; 3 mL bupivacaine 0.5 % Outcome: tolerated well, no immediate complications  Arthrogram demonstrated excellent flow of contrast throughout the joint surface without extravasation or obvious defect.  The patient had relief of symptoms during the anesthetic phase of the injection.  Procedure, treatment alternatives, risks  and benefits explained, specific risks discussed. Consent was given by the patient. Immediately prior to procedure a time out was called to verify the correct patient, procedure, equipment, support staff and site/side marked as required. Patient was prepped and draped in the usual sterile fashion.      No notes on file   Clinical History: MRI LUMBAR SPINE WITHOUT CONTRAST  TECHNIQUE: Multiplanar, multisequence MR imaging of the lumbar spine was performed. No intravenous contrast was administered.  COMPARISON:  Lumbar spine MRI 12/15/2009.  FINDINGS: Vertebral body height is maintained. There is unchanged 0.4 cm anterolisthesis L4 on L5 due to facet arthropathy. Alignment is otherwise unremarkable. Degenerative endplate signal change is present at L4-5 and L5-S1. There is no worrisome marrow lesion. The conus medullaris is normal in signal and position. Imaged intra-abdominal contents are unremarkable.  T11-12 and T12-L1 are imaged in the sagittal plane only. Central and eccentric to the right protrusion at T11-12 is unchanged. The central canal and foramina appear open. T12-L1 is negative.  L1-2:  Negative.  L2-3: Mild facet degenerative disease. No disc bulge or protrusion. The central canal and foramina are open. The appearance is unchanged.  L3-4: Moderate facet arthropathy and mild ligamentum flavum thickening. No disc bulge or protrusion. The central canal and foramina are open. The appearance is unchanged.  L4-5: The disc is uncovered and bulging. Ligamentum flavum thickening and advanced facet degenerative disease are present. Severe central canal stenosis has worsened since the prior examination. The  left foramen is open. Marked right foraminal narrowing appears worse than on the prior examination.  L5-S1: Moderate bilateral facet degenerative disease has progressed. There is a new broad-based disc protrusion more prominent to the left. Narrowing in the  lateral recesses is worse on the left. The right foramen is open. Left foraminal narrowing is not notably changed.  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 Facet degenerative disease at this level results in unchanged 0.4 cm anterolisthesis.  New broad-based disc protrusion at L5-S1 results in narrowing in the lateral recesses, worse on the left. Left foraminal narrowing at this level does not appear changed.   Electronically Signed   By: Inge Rise M.D.   On: 04/14/2015 12:21  She reports that  has never smoked. she has never used smokeless tobacco. No results for input(s): HGBA1C, LABURIC in the last 8760 hours.  Objective:  VS:  HT:    WT:   BMI:     BP:   HR: bpm  TEMP: ( )  RESP:  Physical Exam  Ortho Exam Imaging: No results found.  Past Medical/Family/Surgical/Social History: Medications & Allergies reviewed per EMR There are no active problems to display for this patient.  Past Medical History:  Diagnosis Date  . Arthritis   . Diabetes mellitus   . GERD (gastroesophageal reflux disease)   . Hyperlipemia   . Hypertension   . Seasonal allergies    History reviewed. No pertinent family history. Past Surgical History:  Procedure Laterality Date  . CAPSULOTOMY  01/13/2012   Procedure: MINOR CAPSULOTOMY;  Surgeon: Myrtha Mantis., MD;  Location: [facility];  Service: Ophthalmology;  Laterality: Left;  . DORSAL COMPARTMENT RELEASE  2009   rt   . KNEE ARTHROSCOPY  02,04   right and left  . STERIOD INJECTION  08/01/2011   Procedure: STEROID INJECTION;  Surgeon: Cammie Sickle., MD;  Location: [facility];  Service: Orthopedics;  Laterality: Left;  cmc left thumb  . TRIGGER FINGER RELEASE  2006   rt thumb  . TRIGGER FINGER RELEASE  08/01/2011   Procedure: RELEASE TRIGGER FINGER/A-1 PULLEY;  Surgeon: Cammie Sickle., MD;  Location: [facility];  Service: Orthopedics;  Laterality: Left;  left thumb  . YAG LASER APPLICATION  09/81/1914   Procedure: YAG LASER APPLICATION;  Surgeon: Myrtha Mantis., MD;  Location: [facility];  Service: Ophthalmology;  Laterality: Left;   Social History   Occupational History  . Not on file  Tobacco Use  . Smoking status: Never Smoker  . Smokeless tobacco: Never Used  Substance and Sexual Activity  . Alcohol use: No  . Drug use: No  . Sexual activity: Not on file

## 2017-02-25 NOTE — Patient Instructions (Signed)

## 2017-02-27 MED ORDER — BUPIVACAINE HCL 0.5 % IJ SOLN
3.0000 mL | INTRAMUSCULAR | Status: AC | PRN
  Administered 2017-02-25: 3 mL via INTRA_ARTICULAR

## 2017-02-27 MED ORDER — TRIAMCINOLONE ACETONIDE 40 MG/ML IJ SUSP
80.0000 mg | INTRAMUSCULAR | Status: AC | PRN
  Administered 2017-02-25: 80 mg via INTRA_ARTICULAR

## 2017-03-10 ENCOUNTER — Telehealth (INDEPENDENT_AMBULATORY_CARE_PROVIDER_SITE_OTHER): Payer: Self-pay

## 2017-03-10 NOTE — Telephone Encounter (Signed)
Do you want to offer pt a second injection? Please see below and let me know if there is anything that I need to do.

## 2017-03-10 NOTE — Telephone Encounter (Signed)
Patient Called and left voicemail on triage phone. States she had an injection with Dr Ernestina Patches (02/25/17) for her  left hip and didn't last. States she is still having pain.  CB [phone]

## 2017-03-10 NOTE — Telephone Encounter (Signed)
Pt s/p hip injection with FN 02/25/17 without relief. FN advised should have follow up with you incase you want to order MRI. Do you want to set this up or see pt in office first to discuss?

## 2017-03-10 NOTE — Telephone Encounter (Signed)
She should follow up with Dr. Sharol Given they may want to eval/MRI Hip if shot helped but did not last

## 2017-03-10 NOTE — Telephone Encounter (Signed)
Please see message below from Dr. Newton. 

## 2017-03-10 NOTE — Telephone Encounter (Signed)
Please advise. Left hip injection on 02/25/17.

## 2017-03-11 NOTE — Telephone Encounter (Signed)
Patient has had significant lumbar spine pathology, since hip injection did not work, order MRI of lumbar spine to evaluate for new disc pathology

## 2017-03-12 ENCOUNTER — Other Ambulatory Visit (INDEPENDENT_AMBULATORY_CARE_PROVIDER_SITE_OTHER): Payer: Self-pay

## 2017-03-12 DIAGNOSIS — M5136 Other intervertebral disc degeneration, lumbar region: Secondary | ICD-10-CM

## 2017-03-12 NOTE — Telephone Encounter (Signed)
I called and lm on vm to advise pt that Dr. Sharol Given had suggested new MRI to eval for new disc pathology being that injection with FN did not help. Advised that we would submit the referral and it would take several days to precert with insurance but that we would call once this has been obtained and call and make an appt for her to have imaging. To call with questions.

## 2017-03-17 ENCOUNTER — Encounter (INDEPENDENT_AMBULATORY_CARE_PROVIDER_SITE_OTHER): Payer: Self-pay | Admitting: Orthopedic Surgery

## 2017-03-17 ENCOUNTER — Telehealth (INDEPENDENT_AMBULATORY_CARE_PROVIDER_SITE_OTHER): Payer: Self-pay | Admitting: *Deleted

## 2017-03-17 ENCOUNTER — Ambulatory Visit (INDEPENDENT_AMBULATORY_CARE_PROVIDER_SITE_OTHER): Payer: BLUE CROSS/BLUE SHIELD | Admitting: Orthopedic Surgery

## 2017-03-17 VITALS — Ht 66.0 in | Wt 272.0 lb

## 2017-03-17 DIAGNOSIS — M5416 Radiculopathy, lumbar region: Secondary | ICD-10-CM

## 2017-03-17 DIAGNOSIS — M25552 Pain in left hip: Secondary | ICD-10-CM | POA: Diagnosis not present

## 2017-03-17 NOTE — Telephone Encounter (Signed)
Pt was contacted on 03/15/17 by [facility] imaging, they left vm to return call to schedule follow up

## 2017-03-17 NOTE — Progress Notes (Signed)
Office Visit Note   Patient: Chelsey Lee           Date of Birth: [birth year]           MRN: [id number] Visit Date: 03/17/2017              Requested by: Willey Blade, [place],  [id number] PCP: Willey Blade, MD  Chief Complaint  Patient presents with  . Left Hip - Pain      HPI: Patient presents in follow-up status post intra-articular injection of her left hip.  She states that the injection did not provide her any relief may be an hour or 2.  Patient states her pain is worse with flexion when she goes to getting from a sitting to a standing position.  Patient denies any hip pain with prolonged ambulation.  Patient states she cannot sleep on the left side complains of buttocks and groin pain on the left.  Assessment & Plan: Visit Diagnoses:  1. Pain in left hip   2. Lumbar radiculopathy     Plan: Will obtain an MRI scan of the lumbar spine if this shows any pathology we could consider a epidural steroid injection.  Patient does not have sufficient symptoms for this all to be coming from her hip she does have some mild arthritic changes to the left hip but her main symptoms seem to be coming from her back.  Follow-Up Instructions: Return in about 2 weeks (around 03/31/2017).   Ortho Exam  Patient is alert, oriented, no adenopathy, well-dressed, normal affect, normal respiratory effort. Examination patient is slow to get from a sitting to a standing position she has a normal gait she has no pain with internal or external rotation of the left hip she has a negative straight leg raise she has no focal motor weakness in either lower extremity.  Imaging: No results found. No images are attached to the encounter.  Labs: No results found for: HGBA1C, ESRSEDRATE, CRP, LABURIC, REPTSTATUS, GRAMSTAIN, CULT, LABORGA  @LABSALLVALUES (HGBA1)@  Body mass index is 43.9 kg/m.  Orders:  No orders of the defined types were placed in this  encounter.  No orders of the defined types were placed in this encounter.    Procedures: No procedures performed  Clinical Data: No additional findings.  ROS:  All other systems negative, except as noted in the HPI. Review of Systems  Objective: Vital Signs: Ht 5\' 6"  (1.676 m)   Wt 272 lb (123.4 kg)   BMI 43.90 kg/m   Specialty Comments:  No specialty comments available.  PMFS History: There are no active problems to display for this patient.  Past Medical History:  Diagnosis Date  . Arthritis   . Diabetes mellitus   . GERD (gastroesophageal reflux disease)   . Hyperlipemia   . Hypertension   . Seasonal allergies     History reviewed. No pertinent family history.  Past Surgical History:  Procedure Laterality Date  . CAPSULOTOMY  01/13/2012   Procedure: MINOR CAPSULOTOMY;  Surgeon: Myrtha Mantis., MD;  Location: [facility];  Service: Ophthalmology;  Laterality: Left;  . DORSAL COMPARTMENT RELEASE  2009   rt   . KNEE ARTHROSCOPY  02,04   right and left  . STERIOD INJECTION  08/01/2011   Procedure: STEROID INJECTION;  Surgeon: Cammie Sickle., MD;  Location: [facility];  Service: Orthopedics;  Laterality: Left;  cmc left thumb  . TRIGGER FINGER RELEASE  2006  rt thumb  . TRIGGER FINGER RELEASE  08/01/2011   Procedure: RELEASE TRIGGER FINGER/A-1 PULLEY;  Surgeon: Cammie Sickle., MD;  Location: [facility];  Service: Orthopedics;  Laterality: Left;  left thumb  . YAG LASER APPLICATION  24/49/7530   Procedure: YAG LASER APPLICATION;  Surgeon: Myrtha Mantis., MD;  Location: [facility];  Service: Ophthalmology;  Laterality: Left;   Social History   Occupational History  . Not on file  Tobacco Use  . Smoking status: Never Smoker  . Smokeless tobacco: Never Used  Substance and Sexual Activity  . Alcohol use: No  . Drug use: No  . Sexual activity: Not on file

## 2017-03-17 NOTE — Telephone Encounter (Signed)
-----   Message from Pamella Pert, [place] sent at 03/17/2017  2:01 PM EST ----- This pt was in office today. Order for MRI in chart and looks like pre cert has been obtained ( I think) do we need to send a notice to [facility] to let them know this is good to go?

## 2017-03-18 NOTE — Telephone Encounter (Signed)
IC pt home number sw her mother and advised her that [facility] called her on 03/15/17 to schedule appt, mom said she will let pt know and will call to [place]

## 2017-04-05 ENCOUNTER — Ambulatory Visit
Admission: RE | Admit: 2017-04-05 | Discharge: 2017-04-05 | Disposition: A | Discharge status: Home / Self Care | Payer: BLUE CROSS/BLUE SHIELD | Source: Ambulatory Visit | Attending: Orthopedic Surgery | Admitting: Orthopedic Surgery

## 2017-04-05 DIAGNOSIS — M5136 Other intervertebral disc degeneration, lumbar region: Secondary | ICD-10-CM

## 2017-04-08 ENCOUNTER — Ambulatory Visit (INDEPENDENT_AMBULATORY_CARE_PROVIDER_SITE_OTHER): Payer: BLUE CROSS/BLUE SHIELD | Admitting: Orthopedic Surgery

## 2017-04-08 ENCOUNTER — Encounter (INDEPENDENT_AMBULATORY_CARE_PROVIDER_SITE_OTHER): Payer: Self-pay | Admitting: Orthopedic Surgery

## 2017-04-08 VITALS — Ht 66.0 in | Wt 272.0 lb

## 2017-04-08 DIAGNOSIS — M5416 Radiculopathy, lumbar region: Secondary | ICD-10-CM

## 2017-04-08 DIAGNOSIS — M48062 Spinal stenosis, lumbar region with neurogenic claudication: Secondary | ICD-10-CM | POA: Diagnosis not present

## 2017-04-08 NOTE — Progress Notes (Signed)
Office Visit Note   Patient: Chelsey Lee           Date of Birth: Jan 24, 1955           MRN: [id number] Visit Date: 04/08/2017              Requested by: Willey Blade, [place] [address] [place], [place] [address] PCP: Willey Blade, MD  Chief Complaint  Patient presents with  . Lower Back - Follow-up    MRI L spine review       HPI: Patient patient is a 62 year old woman who presents in follow-up for her lumbar spine.  She has had injections with Dr. Ernestina Patches.  Patient has been having some increasing left-sided radicular symptoms she states that these are now resolving with using heat and her pain only extends to the left buttocks.  Assessment & Plan: Visit Diagnoses:  1. Lumbar radiculopathy   2. Spinal stenosis of lumbar region with neurogenic claudication     Plan: Discussed that if her symptoms worsen she should call Dr. Ernestina Patches schedule her directly and set up an appointment for consideration for epidural steroid injection.  Patient will continue to increase her activities as tolerated continue with heat.  Follow-Up Instructions: Return if symptoms worsen or fail to improve.   Ortho Exam  Patient is alert, oriented, no adenopathy, well-dressed, normal affect, normal respiratory effort. Examination patient has an antalgic gait.  She has a negative straight leg raise bilaterally with no focal motor weakness in his lower extremities she has good hip flexion strength plantarflexion and dorsiflexion strength.  Review of the MRI scan shows disc bulging at L5-S1 with facet arthropathy with left greater than right L5 and S1 nerve root impingement.  Patient also has severe stenosis at L4-5 with 4 mm of anterior slip.  Imaging: No results found. No images are attached to the encounter.  Labs: No results found for: HGBA1C, ESRSEDRATE, CRP, LABURIC, REPTSTATUS, GRAMSTAIN, CULT, LABORGA  @LABSALLVALUES (HGBA1)@  Body mass index is 43.9 kg/m.  Orders:  No  orders of the defined types were placed in this encounter.  No orders of the defined types were placed in this encounter.    Procedures: No procedures performed  Clinical Data: No additional findings.  ROS:  All other systems negative, except as noted in the HPI. Review of Systems  Objective: Vital Signs: Ht 5\' 6"  (1.676 m)   Wt 272 lb (123.4 kg)   BMI 43.90 kg/m   Specialty Comments:  No specialty comments available.  PMFS History: There are no active problems to display for this patient.  Past Medical History:  Diagnosis Date  . Arthritis   . Diabetes mellitus   . GERD (gastroesophageal reflux disease)   . Hyperlipemia   . Hypertension   . Seasonal allergies     History reviewed. No pertinent family history.  Past Surgical History:  Procedure Laterality Date  . CAPSULOTOMY  01/13/2012   Procedure: MINOR CAPSULOTOMY;  Surgeon: Myrtha Mantis., MD;  Location: [facility];  Service: Ophthalmology;  Laterality: Left;  . DORSAL COMPARTMENT RELEASE  2009   rt   . KNEE ARTHROSCOPY  02,04   right and left  . STERIOD INJECTION  08/01/2011   Procedure: STEROID INJECTION;  Surgeon: Cammie Sickle., MD;  Location: [facility];  Service: Orthopedics;  Laterality: Left;  cmc left thumb  . TRIGGER FINGER RELEASE  2006   rt thumb  . TRIGGER FINGER RELEASE  08/01/2011  Procedure: RELEASE TRIGGER FINGER/A-1 PULLEY;  Surgeon: Cammie Sickle., MD;  Location: [facility];  Service: Orthopedics;  Laterality: Left;  left thumb  . YAG LASER APPLICATION  35/82/5189   Procedure: YAG LASER APPLICATION;  Surgeon: Myrtha Mantis., MD;  Location: [facility];  Service: Ophthalmology;  Laterality: Left;   Social History   Occupational History  . Not on file  Tobacco Use  . Smoking status: Never Smoker  . Smokeless tobacco: Never Used  Substance and Sexual Activity  . Alcohol use: No  . Drug use: No  . Sexual activity: Not on file

## 2018-02-06 ENCOUNTER — Other Ambulatory Visit: Payer: Self-pay | Admitting: Nephrology

## 2018-02-06 DIAGNOSIS — I10 Essential (primary) hypertension: Secondary | ICD-10-CM

## 2018-02-06 DIAGNOSIS — N184 Chronic kidney disease, stage 4 (severe): Secondary | ICD-10-CM

## 2018-02-09 ENCOUNTER — Ambulatory Visit
Admission: RE | Admit: 2018-02-09 | Discharge: 2018-02-09 | Disposition: A | Discharge status: Home / Self Care | Payer: BLUE CROSS/BLUE SHIELD | Source: Ambulatory Visit | Attending: Nephrology | Admitting: Nephrology

## 2018-02-09 DIAGNOSIS — I10 Essential (primary) hypertension: Secondary | ICD-10-CM

## 2018-02-09 DIAGNOSIS — N184 Chronic kidney disease, stage 4 (severe): Secondary | ICD-10-CM

## 2018-04-01 ENCOUNTER — Telehealth (INDEPENDENT_AMBULATORY_CARE_PROVIDER_SITE_OTHER): Payer: Self-pay | Admitting: Orthopedic Surgery

## 2018-04-01 NOTE — Telephone Encounter (Signed)
error 

## 2018-04-03 ENCOUNTER — Encounter (INDEPENDENT_AMBULATORY_CARE_PROVIDER_SITE_OTHER): Payer: Self-pay | Admitting: Physician Assistant

## 2018-04-03 ENCOUNTER — Other Ambulatory Visit: Payer: Self-pay

## 2018-04-03 ENCOUNTER — Ambulatory Visit (INDEPENDENT_AMBULATORY_CARE_PROVIDER_SITE_OTHER): Payer: BLUE CROSS/BLUE SHIELD

## 2018-04-03 ENCOUNTER — Ambulatory Visit (INDEPENDENT_AMBULATORY_CARE_PROVIDER_SITE_OTHER): Payer: BLUE CROSS/BLUE SHIELD | Admitting: Orthopaedic Surgery

## 2018-04-03 VITALS — Ht 66.0 in | Wt 272.0 lb

## 2018-04-03 DIAGNOSIS — M1712 Unilateral primary osteoarthritis, left knee: Secondary | ICD-10-CM | POA: Insufficient documentation

## 2018-04-03 MED ORDER — LIDOCAINE HCL 1 % IJ SOLN
2.0000 mL | INTRAMUSCULAR | Status: AC | PRN
  Administered 2018-04-03: 2 mL

## 2018-04-03 MED ORDER — METHYLPREDNISOLONE ACETATE 40 MG/ML IJ SUSP
40.0000 mg | INTRAMUSCULAR | Status: AC | PRN
  Administered 2018-04-03: 40 mg via INTRA_ARTICULAR

## 2018-04-03 MED ORDER — BUPIVACAINE HCL 0.25 % IJ SOLN
2.0000 mL | INTRAMUSCULAR | Status: AC | PRN
  Administered 2018-04-03: 2 mL via INTRA_ARTICULAR

## 2018-04-03 NOTE — Progress Notes (Signed)
Office Visit Note   Patient: Ruta Hinds           Date of Birth: 11/20/1955           MRN: [id number] Visit Date: 04/03/2018              Requested by: Willey Blade, [place],  [address] PCP: Willey Blade, MD   Assessment & Plan: Visit Diagnoses:  1. Localized osteoarthritis of left knee     Plan: Impression is end-stage generative joint disease left knee.  Patient would like to proceed with a cortisone injection today.  We discussed viscosupplementation injection should she want to proceed with treating this conservatively until she can get her BMI under 40.  We have also discussed definitive treatment of total joint arthroplasty.  She will need to get her BMI under 40.  She is a type II diabetic and notes that her most recent A1c was good.  She does not remember an exact number however.  Follow-Up Instructions: Return if symptoms worsen or fail to improve.   Orders:  Orders Placed This Encounter  Procedures  . Large Joint Inj: L knee  . XR Knee Complete 4 Views Left   No orders of the defined types were placed in this encounter.     Procedures: Large Joint Inj: L knee on 04/03/2018 10:00 AM Indications: pain Details: 22 G needle, anterolateral approach Medications: 2 mL bupivacaine 0.25 %; 2 mL lidocaine 1 %; 40 mg methylPREDNISolone acetate 40 MG/ML      Clinical Data: No additional findings.   Subjective: Chief Complaint  Patient presents with  . Left Knee - Pain    HPI patient is a pleasant 63 year old female presents to our clinic today with left knee pain.  Longstanding history of osteoarthritis.  The pain she currently has is been ongoing for the past week.  No known injury or change in activity.  Pain is primarily to the popliteal fossa.  She describes this as a constant toothache with occasional sharp shooting pains.  No calf pain.  Pain is worse when standing or walking really any other types of  activities.  She is a type II diabetic and has been afraid to take any over-the-counter medications for this.  She does note remote cortisone injection which she thinks helped several years back.  Review of Systems as detailed in HPI.  All others reviewed and are negative.   Objective: Vital Signs: Ht 5\' 6"  (1.676 m)   Wt 272 lb (123.4 kg)   BMI 43.90 kg/m   Physical Exam well-developed well-nourished female in no acute distress.  Alert and oriented x3.  Ortho Exam examination of her left knee shows a trace effusion.  Range of motion 0 to 115 degrees.  Moderate patellofemoral crepitus.  Medial and lateral joint line tenderness.  She is stable to valgus varus stress.  Calf is soft and nontender.  She is neurovascularly intact distally.  Specialty Comments:  No specialty comments available.  Imaging: Xr Knee Complete 4 Views Left  Result Date: 04/03/2018 X-rays demonstrate severe tricompartmental degenerative changes    PMFS History: Patient Active Problem List   Diagnosis Date Noted  . Localized osteoarthritis of left knee 04/03/2018   Past Medical History:  Diagnosis Date  . Arthritis   . Diabetes mellitus   . GERD (gastroesophageal reflux disease)   . Hyperlipemia   . Hypertension   . Seasonal allergies     History reviewed. No  pertinent family history.  Past Surgical History:  Procedure Laterality Date  . CAPSULOTOMY  01/13/2012   Procedure: MINOR CAPSULOTOMY;  Surgeon: Myrtha Mantis., MD;  Location: [facility];  Service: Ophthalmology;  Laterality: Left;  . DORSAL COMPARTMENT RELEASE  2009   rt   . KNEE ARTHROSCOPY  02,04   right and left  . STERIOD INJECTION  08/01/2011   Procedure: STEROID INJECTION;  Surgeon: Cammie Sickle., MD;  Location: [facility];  Service: Orthopedics;  Laterality: Left;  cmc left thumb  . TRIGGER FINGER RELEASE  2006   rt thumb  . TRIGGER FINGER RELEASE  08/01/2011   Procedure: RELEASE TRIGGER FINGER/A-1 PULLEY;   Surgeon: Cammie Sickle., MD;  Location: [facility];  Service: Orthopedics;  Laterality: Left;  left thumb  . YAG LASER APPLICATION  37/16/9678   Procedure: YAG LASER APPLICATION;  Surgeon: Myrtha Mantis., MD;  Location: [facility];  Service: Ophthalmology;  Laterality: Left;   Social History   Occupational History  . Not on file  Tobacco Use  . Smoking status: Never Smoker  . Smokeless tobacco: Never Used  Substance and Sexual Activity  . Alcohol use: No  . Drug use: No  . Sexual activity: Not on file

## 2018-09-07 DIAGNOSIS — M778 Other enthesopathies, not elsewhere classified: Secondary | ICD-10-CM | POA: Insufficient documentation

## 2018-10-13 ENCOUNTER — Ambulatory Visit: Payer: BLUE CROSS/BLUE SHIELD | Admitting: Orthopedic Surgery

## 2018-10-15 ENCOUNTER — Ambulatory Visit: Payer: BLUE CROSS/BLUE SHIELD | Admitting: Orthopedic Surgery

## 2018-10-16 ENCOUNTER — Encounter: Payer: Self-pay | Admitting: Family

## 2018-10-16 ENCOUNTER — Ambulatory Visit: Payer: Self-pay

## 2018-10-16 ENCOUNTER — Ambulatory Visit (INDEPENDENT_AMBULATORY_CARE_PROVIDER_SITE_OTHER): Payer: BC Managed Care – PPO | Admitting: Family

## 2018-10-16 ENCOUNTER — Other Ambulatory Visit: Payer: Self-pay

## 2018-10-16 VITALS — Ht 66.0 in | Wt 272.0 lb

## 2018-10-16 DIAGNOSIS — M25571 Pain in right ankle and joints of right foot: Secondary | ICD-10-CM | POA: Diagnosis not present

## 2018-10-16 DIAGNOSIS — M25572 Pain in left ankle and joints of left foot: Secondary | ICD-10-CM

## 2018-10-16 MED ORDER — PREDNISONE 50 MG PO TABS: ORAL_TABLET | ORAL | 0 refills | Status: DC

## 2018-10-19 ENCOUNTER — Telehealth: Payer: Self-pay | Admitting: *Deleted

## 2018-10-21 ENCOUNTER — Encounter: Payer: Self-pay | Admitting: Family

## 2018-10-21 ENCOUNTER — Telehealth: Payer: Self-pay | Admitting: Family

## 2018-10-21 NOTE — Telephone Encounter (Signed)
[facility] is where she would like the RX faxed to for the stockings. Fax number is [phone]

## 2018-10-21 NOTE — Progress Notes (Signed)
Office Visit Note   Patient: Chelsey Lee           Date of Birth: [birth year]           MRN: [id number] Visit Date: 10/16/2018              Requested by: Willey Blade, [place] [address] [place],  [place] [address] PCP: Willey Blade, MD  Chief Complaint  Patient presents with  . Left Foot - Pain  . Right Foot - Pain      HPI: The patient is a 63 year old gentleman who presents today for evaluation of bilateral ankle and foot pain.  Her main complaint is the ankle pain she is complaining of numbness and burning pain to the dorsum of her left foot.  She also feels that her ankles are mildly swollen.  She does have some pain around the lateral aspect of her right ankle.  She has not had any worse injury she does relate that after prolonged sitting and rest she has worse pain down her legs.  Is able to relate that leaning over the sink to do dishes actually worsens her pain does not relieve them.  Has a history of lumbar radiculopathy on the left.  When asked she does relate that she also has left buttock and left low back symptoms today.  No weakness no red flag symptoms.  No recent injury.  Assessment & Plan: Visit Diagnoses:  1. Bilateral ankle pain, unspecified chronicity     Plan: Feel that the left-sided symptoms are likely related to lumbar radiculopathy will call in a prescription for prednisone she will be referred to Dr. Ernestina Patches for lumbar ESI follow-up in office with [place] if any continued symptoms after this.  Follow-Up Instructions: Return in about 3 weeks (around 11/06/2018).   Right Ankle Exam   Tenderness  The patient is experiencing tenderness in the ATF. Swelling: mild  Range of Motion  The patient has normal right ankle ROM.  Muscle Strength  The patient has normal right ankle strength.  Tests  Anterior drawer: negative Varus tilt: negative  Other  Erythema: absent   Comments:  Mild anterior joint line tenderness   Left  Ankle Exam  Left ankle exam is normal.  Comments:  Burning pain over the dorsum of her foot that is intermittent none at current   Back Exam   Tenderness  The patient is experiencing tenderness in the lumbar.  Muscle Strength  The patient has normal back strength.  Tests  Straight leg raise right: negative Straight leg raise left: negative  Other  Gait: normal       Patient is alert, oriented, no adenopathy, well-dressed, normal affect, normal respiratory effort.   Imaging: No results found. No images are attached to the encounter.  Labs: No results found for: HGBA1C, ESRSEDRATE, CRP, LABURIC, REPTSTATUS, GRAMSTAIN, CULT, LABORGA   Lab Results  Component Value Date   ALBUMIN 3.1 (L) 07/20/2015    No results found for: MG No results found for: VD25OH  No results found for: PREALBUMIN CBC EXTENDED Latest Ref Rng & Units 07/20/2015 08/01/2011 07/28/2007  WBC 4.0 - 10.5 K/uL 5.2 - -  RBC 3.87 - 5.11 MIL/uL 2.92(L) - -  HGB 12.0 - 15.0 g/dL 8.3(L) 10.6(L) 12.6  HCT 36.0 - 46.0 % 25.9(L) - 37.0  PLT 150 - 400 K/uL 134(L) - -  NEUTROABS 1.7 - 7.7 K/uL 3.0 - -  LYMPHSABS 0.7 - 4.0 K/uL 1.5 - -  Body mass index is 43.9 kg/m.  Orders:  Orders Placed This Encounter  Procedures  . XR Ankle Complete Left  . XR Ankle Complete Right  . Ambulatory referral to Physical Medicine Rehab   Meds ordered this encounter  Medications  . predniSONE (DELTASONE) 50 MG tablet    Sig: Take one tablet by mouth once daily for 5 days.    Dispense:  5 tablet    Refill:  0     Procedures: No procedures performed  Clinical Data: No additional findings.  ROS:  All other systems negative, except as noted in the HPI. Review of Systems  Constitutional: Negative for chills and fever.  Musculoskeletal: Positive for back pain and myalgias.  Neurological: Negative for weakness and numbness.    Objective: Vital Signs: Ht 5\' 6"  (1.676 m)   Wt 272 lb (123.4 kg)   BMI 43.90  kg/m   Specialty Comments:  No specialty comments available.  PMFS History: Patient Active Problem List   Diagnosis Date Noted  . Localized osteoarthritis of left knee 04/03/2018   Past Medical History:  Diagnosis Date  . Arthritis   . Diabetes mellitus   . GERD (gastroesophageal reflux disease)   . Hyperlipemia   . Hypertension   . Seasonal allergies     History reviewed. No pertinent family history.  Past Surgical History:  Procedure Laterality Date  . CAPSULOTOMY  01/13/2012   Procedure: MINOR CAPSULOTOMY;  Surgeon: Myrtha Mantis., MD;  Location: [facility];  Service: Ophthalmology;  Laterality: Left;  . DORSAL COMPARTMENT RELEASE  2009   rt   . KNEE ARTHROSCOPY  02,04   right and left  . STERIOD INJECTION  08/01/2011   Procedure: STEROID INJECTION;  Surgeon: Cammie Sickle., MD;  Location: [facility];  Service: Orthopedics;  Laterality: Left;  cmc left thumb  . TRIGGER FINGER RELEASE  2006   rt thumb  . TRIGGER FINGER RELEASE  08/01/2011   Procedure: RELEASE TRIGGER FINGER/A-1 PULLEY;  Surgeon: Cammie Sickle., MD;  Location: [facility];  Service: Orthopedics;  Laterality: Left;  left thumb  . YAG LASER APPLICATION  76/54/6503   Procedure: YAG LASER APPLICATION;  Surgeon: Myrtha Mantis., MD;  Location: [facility];  Service: Ophthalmology;  Laterality: Left;   Social History   Occupational History  . Not on file  Tobacco Use  . Smoking status: Never Smoker  . Smokeless tobacco: Never Used  Substance and Sexual Activity  . Alcohol use: No  . Drug use: No  . Sexual activity: Not on file

## 2018-10-21 NOTE — Telephone Encounter (Signed)
Patient called. She would like thin compression stockings. Prefers the waist high. Her call back number is [phone]

## 2018-10-21 NOTE — Telephone Encounter (Signed)
I called and sw pt and she would like an order sent to [facility] for waist high 15/20 mm hg compression hose and for 15/20 knee high Vive socks. This order was faxed to [facility] by [facility] today.

## 2018-11-06 ENCOUNTER — Other Ambulatory Visit: Payer: Self-pay | Admitting: Physician Assistant

## 2018-11-06 ENCOUNTER — Ambulatory Visit: Payer: BC Managed Care – PPO | Admitting: Family

## 2018-11-09 ENCOUNTER — Ambulatory Visit: Payer: Self-pay

## 2018-11-09 ENCOUNTER — Encounter: Payer: Self-pay | Admitting: Physical Medicine and Rehabilitation

## 2018-11-09 ENCOUNTER — Ambulatory Visit (INDEPENDENT_AMBULATORY_CARE_PROVIDER_SITE_OTHER): Payer: BC Managed Care – PPO | Admitting: Physical Medicine and Rehabilitation

## 2018-11-09 ENCOUNTER — Other Ambulatory Visit: Payer: Self-pay

## 2018-11-09 VITALS — BP 144/73 | HR 70

## 2018-11-09 DIAGNOSIS — M5416 Radiculopathy, lumbar region: Secondary | ICD-10-CM

## 2018-11-09 MED ORDER — METHYLPREDNISOLONE ACETATE 80 MG/ML IJ SUSP
80.0000 mg | Freq: Once | INTRAMUSCULAR | Status: AC
  Administered 2018-11-09: 80 mg

## 2018-11-09 NOTE — Progress Notes (Signed)
 .  Numeric Pain Rating Scale and Functional Assessment Average Pain 8   In the last MONTH (on 0-10 scale) has pain interfered with the following?  1. General activity like being  able to carry out your everyday physical activities such as walking, climbing stairs, carrying groceries, or moving a chair?  Rating(8)   +Driver, -BT, -Dye Allergies.  

## 2018-12-09 NOTE — Progress Notes (Signed)
Chelsey Lee - 63 y.o. female MRN [id number]  Date of birth: May 04, 1955  Office Visit Note: Visit Date: 11/09/2018 PCP: Willey Blade, MD Referred by: Willey Blade, MD  Subjective: Chief Complaint  Patient presents with  . Lower Back - Pain  . Left Foot - Pain   HPI:  Chelsey Lee is a 63 y.o. female who comes in today At the request of Dondra Prader, FN-P for work in epidural injection for severe left lower back and leg pain to the foot.  Patient reports 8 out of 10 pain with paresthesia.  She has no focal weakness no bowel or bladder changes.  She is a patient that I have seen before.  We completed epidural injection in 2018 with some relief.  At the time she was having some hip issues as well.  Since I have seen her she has had MRI of the lumbar spine completed.  This is reviewed with her today and reviewed in the notes below.  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  She also has disc extrusion at L5-S1 with some impact of the left more than right S1 nerve roots.  From her symptoms that might be the likely cause of her symptoms more than the stenosis.  Her case is complicated by morbid obesity.  She is trying to lose weight and is on phentermine.  She is an insulin-dependent diabetic.  We will complete the injection today.  Consider transforaminal approach at L4 and S1.  Should consider surgical referral at some point.  ROS Otherwise per HPI.  Assessment & Plan: Visit Diagnoses:  1. Lumbar radiculopathy     Plan: No additional findings.   Meds & Orders:  Meds ordered this encounter  Medications  . methylPREDNISolone acetate (DEPO-MEDROL) injection 80 mg    Orders Placed This Encounter  Procedures  . XR C-ARM NO REPORT  . Epidural Steroid injection    Follow-up: No follow-ups on file.   Procedures: No procedures performed  Lumbar Epidural Steroid Injection - Interlaminar Approach with Fluoroscopic Guidance  Patient: Ruta Hinds       Date of Birth: 1955-04-21 MRN: [id number] PCP: Willey Blade, MD      Visit Date: 11/09/2018   Universal Protocol:     Consent Given By: the patient  Position: PRONE  Additional Comments: Vital signs were monitored before and after the procedure. Patient was prepped and draped in the usual sterile fashion. The correct patient, procedure, and site was verified.   Injection Procedure Details:  Procedure Site One Meds Administered:  Meds ordered this encounter  Medications  . methylPREDNISolone acetate (DEPO-MEDROL) injection 80 mg     Laterality: Left  Location/Site:  L5-S1  Needle size: 20 G  Needle type: Tuohy  Needle Placement: Paramedian epidural  Findings:   -Comments: Excellent flow of contrast into the epidural space.  Procedure Details: Using a paramedian approach from the side mentioned above, the region overlying the inferior lamina was localized under fluoroscopic visualization and the soft tissues overlying this structure were infiltrated with 4 ml. of 1% Lidocaine without Epinephrine. The Tuohy needle was inserted into the epidural space using a paramedian approach.   The epidural space was localized using loss of resistance along with lateral and bi-planar fluoroscopic views.  After negative aspirate for air, blood, and CSF, a 2 ml. volume of Isovue-250 was injected into the epidural space and the flow of contrast was observed. Radiographs were obtained for documentation purposes.    The  injectate was administered into the level noted above.   Additional Comments:  The patient tolerated the procedure well Dressing: 2 x 2 sterile gauze and Band-Aid    Post-procedure details: Patient was observed during the procedure. Post-procedure instructions were reviewed.  Patient left the clinic in stable condition.   Clinical History: MRI LUMBAR SPINE WITHOUT CONTRAST  TECHNIQUE: Multiplanar, multisequence MR imaging of the lumbar spine was  performed. No intravenous contrast was administered.  COMPARISON:  04/14/2015.  FINDINGS: Segmentation:  Standard.  Alignment:  4 mm anterolisthesis L4-5 is stable from priors.  Vertebrae: No fracture, evidence of discitis, or bone lesion. Endplate reactive changes, Modic type 2, above and below L4-5. Modic type 1 changes demonstrating a degree of acuity, above and below L5-S1.  Conus medullaris and cauda equina: Conus extends to the L1 level and appears normal. Cauda equina nerve roots are compressed at the L4-5 level, described below.  Paraspinal and other soft tissues: Unremarkable.  Disc levels:  L1-L2: Unremarkable disc space. Facet arthropathy. No impingement.  L2-L3: Unremarkable disc space. Facet arthropathy. No impingement.  L3-L4: Unremarkable disc space. Facet arthropathy. No impingement.  L4-L5: Near complete loss of interspace height. Osseous spurring. Uncovering of the disc with central protrusion. Posterior element hypertrophy. Severe stenosis. Bunching of the cauda equina nerve roots. BILATERAL L5 nerve root impingement. Extraforaminal disc herniation and/or spurring on the RIGHT. Superimposed osseous spurring, slip, and disc material contributes to severe foraminal narrowing on the RIGHT affecting the L4 nerve root. LEFT foramen is narrowed but clear-cut L4 nerve root impingement not established.  L5-S1: Disc space narrowing. Central disc protrusion/extrusion. Posterior element hypertrophy. Mild stenosis. LEFT greater than RIGHT L5 and S1 nerve root impingement are likely.  Compared with priors, there is a similar appearance.  IMPRESSION: Severe stenosis at L4-5 is redemonstrated. 4 mm slip, posterior element hypertrophy, central protrusion and osseous spurring results in BILATERAL L5 and RIGHT L4 nerve root impingement. Consider standing flexion extension films to evaluate for dynamic instability at the L4-5 level, which could  potentially worsen the stenosis.  Central disc protrusion/extrusion at L5-S1. In conjunction with bony overgrowth, including facet arthropathy, there is LEFT greater than RIGHT L5 and S1 nerve root impingement.  Overall similar appearance to priors, with the most severe changes noted at the L4-5 level.   Electronically Signed   By: Staci Righter M.D.   On: 04/05/2017 16:28     Objective:  VS:  HT:    WT:   BMI:     BP:(!) 144/73  HR:70bpm  TEMP: ( )  RESP:  Physical Exam  Ortho Exam Imaging: No results found.

## 2018-12-09 NOTE — Procedures (Signed)
Lumbar Epidural Steroid Injection - Interlaminar Approach with Fluoroscopic Guidance  Patient: Chelsey Lee      Date of Birth: 08-14-1955 MRN: [id number] PCP: Willey Blade, MD      Visit Date: 11/09/2018   Universal Protocol:     Consent Given By: the patient  Position: PRONE  Additional Comments: Vital signs were monitored before and after the procedure. Patient was prepped and draped in the usual sterile fashion. The correct patient, procedure, and site was verified.   Injection Procedure Details:  Procedure Site One Meds Administered:  Meds ordered this encounter  Medications  . methylPREDNISolone acetate (DEPO-MEDROL) injection 80 mg     Laterality: Left  Location/Site:  L5-S1  Needle size: 20 G  Needle type: Tuohy  Needle Placement: Paramedian epidural  Findings:   -Comments: Excellent flow of contrast into the epidural space.  Procedure Details: Using a paramedian approach from the side mentioned above, the region overlying the inferior lamina was localized under fluoroscopic visualization and the soft tissues overlying this structure were infiltrated with 4 ml. of 1% Lidocaine without Epinephrine. The Tuohy needle was inserted into the epidural space using a paramedian approach.   The epidural space was localized using loss of resistance along with lateral and bi-planar fluoroscopic views.  After negative aspirate for air, blood, and CSF, a 2 ml. volume of Isovue-250 was injected into the epidural space and the flow of contrast was observed. Radiographs were obtained for documentation purposes.    The injectate was administered into the level noted above.   Additional Comments:  The patient tolerated the procedure well Dressing: 2 x 2 sterile gauze and Band-Aid    Post-procedure details: Patient was observed during the procedure. Post-procedure instructions were reviewed.  Patient left the clinic in stable condition.

## 2019-01-25 ENCOUNTER — Encounter: Payer: Self-pay | Admitting: Orthopedic Surgery

## 2019-01-25 ENCOUNTER — Ambulatory Visit: Payer: 59

## 2019-01-25 ENCOUNTER — Ambulatory Visit (INDEPENDENT_AMBULATORY_CARE_PROVIDER_SITE_OTHER): Payer: 59 | Admitting: Orthopedic Surgery

## 2019-01-25 ENCOUNTER — Other Ambulatory Visit: Payer: Self-pay

## 2019-01-25 VITALS — Ht 66.0 in | Wt 272.0 lb

## 2019-01-25 DIAGNOSIS — M25552 Pain in left hip: Secondary | ICD-10-CM | POA: Diagnosis not present

## 2019-01-25 DIAGNOSIS — M5416 Radiculopathy, lumbar region: Secondary | ICD-10-CM | POA: Diagnosis not present

## 2019-01-25 NOTE — Progress Notes (Signed)
Office Visit Note   Patient: Chelsey Lee           Date of Birth: [birth year]           MRN: [id number] Visit Date: 01/25/2019              Requested by: Willey Blade, [address] [place],  [place] [address] PCP: Willey Blade, MD  Chief Complaint  Patient presents with  . Lower Back - Pain      HPI: This is a pleasant woman with a chief complaint of right buttock pain radiating down her right lower extremity and left groin pain she did get an  epidural steroid injection with Dr. Ernestina Patches in October and reported that for 1 week it significantly improved her pain she has left groin pain that makes it difficult for her to lift her leg into the car as it is painful she also has difficulty sitting she denies any weakness  Assessment & Plan: Visit Diagnoses:  1. Pain in left hip   2. Lumbar radiculopathy     Plan: I suspect most of her difficulties are coming from her spine.  She did have a successful diagnostic injection however it was short-lived I recommended an MRI.  Follow-Up Instructions: No follow-ups on file.   Ortho Exam  Patient is alert, oriented, no adenopathy, well-dressed, normal affect, normal respiratory effort. Left lower extremity she is able to lift her leg does painful she has some mild stiffness with internal and external rotation. Strength is limited by pain  Imaging: No results found. No images are attached to the encounter.  Labs: No results found for: HGBA1C, ESRSEDRATE, CRP, LABURIC, REPTSTATUS, GRAMSTAIN, CULT, LABORGA   Lab Results  Component Value Date   ALBUMIN 3.1 (L) 07/20/2015    No results found for: MG No results found for: VD25OH  No results found for: PREALBUMIN CBC EXTENDED Latest Ref Rng & Units 07/20/2015 08/01/2011 07/28/2007  WBC 4.0 - 10.5 K/uL 5.2 - -  RBC 3.87 - 5.11 MIL/uL 2.92(L) - -  HGB 12.0 - 15.0 g/dL 8.3(L) 10.6(L) 12.6  HCT 36.0 - 46.0 % 25.9(L) - 37.0  PLT 150 - 400 K/uL 134(L) - -    NEUTROABS 1.7 - 7.7 K/uL 3.0 - -  LYMPHSABS 0.7 - 4.0 K/uL 1.5 - -     Body mass index is 43.9 kg/m.  Orders:  Orders Placed This Encounter  Procedures  . XR HIP UNILAT W OR W/O PELVIS 2-3 VIEWS LEFT  . MR Lumbar Spine w/o contrast   No orders of the defined types were placed in this encounter.    Procedures: No procedures performed  Clinical Data: No additional findings.  ROS:  All other systems negative, except as noted in the HPI. Review of Systems  Objective: Vital Signs: Ht 5\' 6"  (1.676 m)   Wt 272 lb (123.4 kg)   BMI 43.90 kg/m   Specialty Comments:  No specialty comments available.  PMFS History: Patient Active Problem List   Diagnosis Date Noted  . Localized osteoarthritis of left knee 04/03/2018   Past Medical History:  Diagnosis Date  . Arthritis   . Diabetes mellitus   . GERD (gastroesophageal reflux disease)   . Hyperlipemia   . Hypertension   . Seasonal allergies     No family history on file.  Past Surgical History:  Procedure Laterality Date  . CAPSULOTOMY  01/13/2012   Procedure: MINOR CAPSULOTOMY;  Surgeon: Myrtha Mantis.,  MD;  Location: [facility];  Service: Ophthalmology;  Laterality: Left;  . DORSAL COMPARTMENT RELEASE  2009   rt   . KNEE ARTHROSCOPY  02,04   right and left  . STERIOD INJECTION  08/01/2011   Procedure: STEROID INJECTION;  Surgeon: Cammie Sickle., MD;  Location: [facility];  Service: Orthopedics;  Laterality: Left;  [facility] left thumb  . TRIGGER FINGER RELEASE  2006   rt thumb  . TRIGGER FINGER RELEASE  08/01/2011   Procedure: RELEASE TRIGGER FINGER/A-1 PULLEY;  Surgeon: Cammie Sickle., MD;  Location: [facility];  Service: Orthopedics;  Laterality: Left;  left thumb  . YAG LASER APPLICATION  23/36/1224   Procedure: YAG LASER APPLICATION;  Surgeon: Myrtha Mantis., MD;  Location: [facility];  Service: Ophthalmology;  Laterality: Left;   Social History   Occupational  History  . Not on file  Tobacco Use  . Smoking status: Never Smoker  . Smokeless tobacco: Never Used  Substance and Sexual Activity  . Alcohol use: No  . Drug use: No  . Sexual activity: Not on file

## 2019-02-04 ENCOUNTER — Other Ambulatory Visit: Payer: Self-pay

## 2019-02-04 ENCOUNTER — Ambulatory Visit
Admission: RE | Admit: 2019-02-04 | Discharge: 2019-02-04 | Disposition: A | Discharge status: Home / Self Care | Payer: 59 | Source: Ambulatory Visit | Attending: Physician Assistant | Admitting: Physician Assistant

## 2019-02-04 DIAGNOSIS — M5416 Radiculopathy, lumbar region: Secondary | ICD-10-CM

## 2019-02-11 ENCOUNTER — Encounter: Payer: Self-pay | Admitting: Orthopedic Surgery

## 2019-02-11 ENCOUNTER — Ambulatory Visit: Payer: 59 | Admitting: Orthopedic Surgery

## 2019-02-11 ENCOUNTER — Other Ambulatory Visit: Payer: Self-pay

## 2019-02-11 VITALS — Ht 66.0 in | Wt 272.0 lb

## 2019-02-11 DIAGNOSIS — M5416 Radiculopathy, lumbar region: Secondary | ICD-10-CM

## 2019-02-11 DIAGNOSIS — M48062 Spinal stenosis, lumbar region with neurogenic claudication: Secondary | ICD-10-CM | POA: Diagnosis not present

## 2019-02-11 MED ORDER — HYDROCODONE-ACETAMINOPHEN 5-325 MG PO TABS: 1.0000 | ORAL_TABLET | Freq: Four times a day (QID) | ORAL | 0 refills | Status: DC | PRN

## 2019-02-11 NOTE — Progress Notes (Signed)
Office Visit Note   Patient: Chelsey Lee           Date of Birth: January 21, 1956           MRN: [id number] Visit Date: 02/11/2019              Requested by: Willey Blade, [address] [place],  [place] [address] PCP: Willey Blade, MD  Chief Complaint  Patient presents with  . Lower Back - Follow-up    MRI review       HPI: Patient is a 64 year old woman who presents for follow-up status post MRI scan of her lumbar spine patient has been having radicular pain into the left groin at this time previous radicular symptoms were on the right side which were treated with epidural steroid injections with Dr. Ernestina Patches.    Patient states that she fell in the parking lot after her MRI scan.  Patient states there were no lights on her phone was dead and it took 2 hours for cleaning service to find her lying in the parking lot.  Patient complains of contusion to both knees.  Assessment & Plan: Visit Diagnoses:  1. Lumbar radiculopathy   2. Spinal stenosis of lumbar region with neurogenic claudication     Plan: Will refer patient to Dr. Ernestina Patches for evaluation for epidural steroid injection.  Due to her recent fall and pain in both lower extremities will call in a prescription for Vicodin.  Follow-Up Instructions: Return if symptoms worsen or fail to improve, for Follow-up with Laurence Spates.   Ortho Exam  Patient is alert, oriented, no adenopathy, well-dressed, normal affect, normal respiratory effort. Examination patient has no pain with range of motion of the hip knee or ankle on the left side she complains of left-sided radicular pain to the groin.  She has a negative straight leg raise on the left with no focal motor weakness.  MRI scan does show advanced degenerative disc disease of the lumbar spine with degenerative changes worse at L4-5.    Imaging: No results found. No images are attached to the encounter.  Labs: No results found for: HGBA1C, ESRSEDRATE,  CRP, LABURIC, REPTSTATUS, GRAMSTAIN, CULT, LABORGA   Lab Results  Component Value Date   ALBUMIN 3.1 (L) 07/20/2015    No results found for: MG No results found for: VD25OH  No results found for: PREALBUMIN CBC EXTENDED Latest Ref Rng & Units 07/20/2015 08/01/2011 07/28/2007  WBC 4.0 - 10.5 K/uL 5.2 - -  RBC 3.87 - 5.11 MIL/uL 2.92(L) - -  HGB 12.0 - 15.0 g/dL 8.3(L) 10.6(L) 12.6  HCT 36.0 - 46.0 % 25.9(L) - 37.0  PLT 150 - 400 K/uL 134(L) - -  NEUTROABS 1.7 - 7.7 K/uL 3.0 - -  LYMPHSABS 0.7 - 4.0 K/uL 1.5 - -     Body mass index is 43.9 kg/m.  Orders:  No orders of the defined types were placed in this encounter.  No orders of the defined types were placed in this encounter.    Procedures: No procedures performed  Clinical Data: No additional findings.  ROS:  All other systems negative, except as noted in the HPI. Review of Systems  Objective: Vital Signs: Ht 5\' 6"  (1.676 m)   Wt 272 lb (123.4 kg)   BMI 43.90 kg/m   Specialty Comments:  No specialty comments available.  PMFS History: Patient Active Problem List   Diagnosis Date Noted  . Localized osteoarthritis of left knee 04/03/2018  Past Medical History:  Diagnosis Date  . Arthritis   . Diabetes mellitus   . GERD (gastroesophageal reflux disease)   . Hyperlipemia   . Hypertension   . Seasonal allergies     History reviewed. No pertinent family history.  Past Surgical History:  Procedure Laterality Date  . CAPSULOTOMY  01/13/2012   Procedure: MINOR CAPSULOTOMY;  Surgeon: Myrtha Mantis., MD;  Location: [facility];  Service: Ophthalmology;  Laterality: Left;  . DORSAL COMPARTMENT RELEASE  2009   rt   . KNEE ARTHROSCOPY  02,04   right and left  . STERIOD INJECTION  08/01/2011   Procedure: STEROID INJECTION;  Surgeon: Cammie Sickle., MD;  Location: [facility];  Service: Orthopedics;  Laterality: Left;  [facility] left thumb  . TRIGGER FINGER RELEASE  2006   rt thumb  . TRIGGER  FINGER RELEASE  08/01/2011   Procedure: RELEASE TRIGGER FINGER/A-1 PULLEY;  Surgeon: Cammie Sickle., MD;  Location: [facility];  Service: Orthopedics;  Laterality: Left;  left thumb  . YAG LASER APPLICATION  72/09/4707   Procedure: YAG LASER APPLICATION;  Surgeon: Myrtha Mantis., MD;  Location: [facility];  Service: Ophthalmology;  Laterality: Left;   Social History   Occupational History  . Not on file  Tobacco Use  . Smoking status: Never Smoker  . Smokeless tobacco: Never Used  Substance and Sexual Activity  . Alcohol use: No  . Drug use: No  . Sexual activity: Not on file

## 2019-03-02 ENCOUNTER — Ambulatory Visit: Payer: Self-pay

## 2019-03-02 ENCOUNTER — Encounter: Payer: Self-pay | Admitting: Physical Medicine and Rehabilitation

## 2019-03-02 ENCOUNTER — Other Ambulatory Visit: Payer: Self-pay

## 2019-03-02 ENCOUNTER — Ambulatory Visit: Payer: 59 | Admitting: Physical Medicine and Rehabilitation

## 2019-03-02 VITALS — BP 150/74 | HR 74 | Ht 66.0 in | Wt 270.0 lb

## 2019-03-02 DIAGNOSIS — M25552 Pain in left hip: Secondary | ICD-10-CM

## 2019-03-02 DIAGNOSIS — E882 Lipomatosis, not elsewhere classified: Secondary | ICD-10-CM

## 2019-03-02 DIAGNOSIS — D1779 Benign lipomatous neoplasm of other sites: Secondary | ICD-10-CM

## 2019-03-02 DIAGNOSIS — M4316 Spondylolisthesis, lumbar region: Secondary | ICD-10-CM | POA: Diagnosis not present

## 2019-03-02 DIAGNOSIS — M48062 Spinal stenosis, lumbar region with neurogenic claudication: Secondary | ICD-10-CM | POA: Diagnosis not present

## 2019-03-02 MED ORDER — BUPIVACAINE HCL 0.25 % IJ SOLN
4.0000 mL | INTRAMUSCULAR | Status: AC | PRN
  Administered 2019-03-02: 4 mL via INTRA_ARTICULAR

## 2019-03-02 MED ORDER — TRIAMCINOLONE ACETONIDE 40 MG/ML IJ SUSP
60.0000 mg | INTRAMUSCULAR | Status: AC | PRN
  Administered 2019-03-02: 60 mg via INTRA_ARTICULAR

## 2019-03-02 NOTE — Progress Notes (Signed)
Chelsey Lee - 64 y.o. female MRN [id number]  Date of birth: [birth year]  Office Visit Note: Visit Date: 03/02/2019 PCP: Willey Blade, MD Referred by: Willey Blade, MD  Subjective: Chief Complaint  Patient presents with  . Lower Back - Pain  . Right Thigh - Pain  . Left Thigh - Pain  . Left Hip - Pain   HPI: Chelsey Lee is a 64 y.o. female who comes in today For evaluation and management for her low back and hip and thigh pain left more than right at the request of Dr. Meridee Score.  Her biggest complaint is left-sided posterior lateral and anterior hip pain radiating into the groin and anterior thigh to the knee.  She has pain sometimes posteriorly to both thighs.  All of this is worse with standing and ambulating at times.  No real claudication type symptoms that she endorses.  She has more pain with forward flexion and getting in and out of car.  It overall sounds more of a hip related issue.  By way of review she is followed by Dr. Meridee Score and he has looked at her hip and lumbar spine MRI on a few occasions.  Initially in 2018 I saw her and completed transforaminal injection at L4 and she got some relief at that time but it was right-sided complaints it was not hip and groin pain at the time.  We saw her again in 2019 or 20 and completed hip injection intra-articularly because she is having left hip and groin pain.  I her notes state that she got some relief initially with the anesthetic phase.  In the follow-up note with Dr. Sharol Given he reported that she had a week of some relief but not much after that and she still had painful forward flexion which she is still complaining of today.  We ended up completing interlaminar injection after that at L5-S1 and she got no real relief from that.  She now has had updated MRI of the lumbar spine and this is reviewed with patient today and reviewed below.  Dr. Sharol Given has also reviewed this with her.  This does show pretty moderate to severe  canal stenosis at L4-5 with listhesis.  There is also left lateral recess stenosis at L5-S1.  There is epidural lipomatosis but only positive mild stenosis at L2-3 and L3-4.  She has moderate foraminal narrowing on both sides at L3 and really only mild at L2.  No clear-cut L2 impact of any nerve roots.  No real clear-cut impact at L3.  She denies any paresthesias.  She really denies anything really past the knee down into the foot.  She has had no focal weakness no bowel or bladder difficulties.  She reports most of the pain comes from getting out of the car and she has to, slide her left foot over instead of picking up her hip and moving it over.  Or picking up her thigh moving over.  She does not report as much back pain but does have some back pain.  Again no real claudication symptoms.  Hip x-rays have been performed and Duda body habitus difficult to see those pictures but it does appear that there is some degenerative change but there is some joint spacing.   Review of Systems  Constitutional: Negative for chills, fever, malaise/fatigue and weight loss.  HENT: Negative for hearing loss and sinus pain.   Eyes: Negative for blurred vision, double vision and photophobia.  Respiratory: Negative for  cough and shortness of breath.   Cardiovascular: Negative for chest pain, palpitations and leg swelling.  Gastrointestinal: Negative for abdominal pain, nausea and vomiting.  Genitourinary: Negative for flank pain.  Musculoskeletal: Positive for back pain and joint pain. Negative for myalgias.  Skin: Negative for itching and rash.  Neurological: Negative for tremors, focal weakness and weakness.  Endo/Heme/Allergies: Negative.   Psychiatric/Behavioral: Negative for depression.  All other systems reviewed and are negative.  Otherwise per HPI.  Assessment & Plan: Visit Diagnoses:  1. Pain in left hip   2. Spinal stenosis of lumbar region with neurogenic claudication   3. Spondylolisthesis of lumbar  region   4. Epidural lipomatosis     Plan: Findings:  Somewhat complicated issue of chronic worsening low back pain with left hip and groin pain radiating to the knee anteriorly.  Her biggest complaint really is this almost mechanical left hip and thigh pain to the knee anteriorly.  She does does not seem to have anything in the lumbar spine that would fit with that.  There is some moderate foraminal narrowing which I guess could do it.  This would be at L3.  Typically would not get groin pain from that.  I think the best approach is to do this diagnostically with an intra-articular hip injection and she is going to keep almost a pain diary for the rest of the day.  And I told her that if this really makes a big difference even if it short-lived but it is a huge difference for her she should know that it is making a difference and we can kindly go from there.  If it does not seem to make any difference especially if the injection is very well placed and then I would look at transforaminal epidural steroid injection and otherwise I am really not sure where to go at that point.  She would benefit from continued strengthening exercises and weight loss.  The epidural lipomatosis is not significant at this point but could get worse.  She will continue to follow-up with Dr. Sharol Given for her orthopedic complaints.    Meds & Orders: No orders of the defined types were placed in this encounter.   Orders Placed This Encounter  Procedures  . Large Joint Inj  . XR C-ARM NO REPORT    Follow-up: Return for If no relief we will complete L4 transforaminal injection..   Procedures: Large Joint Inj: L hip joint (Left) on 03/02/2019 12:02 PM Indications: diagnostic evaluation and pain Details: 22 G 3.5 in needle, fluoroscopy-guided anterior approach  Arthrogram: No  Medications: 4 mL bupivacaine 0.25 %; 60 mg triamcinolone acetonide 40 MG/ML Outcome: tolerated well, no immediate complications  There was excellent  flow of contrast producing a partial arthrogram of the hip.  The patient endorsed relief during the anesthetic phase of the injection.  She still stated she had some pain with forward flexion.  She is going to keep a pain diary for [place] today. Procedure, treatment alternatives, risks and benefits explained, specific risks discussed. Consent was given by the patient. Immediately prior to procedure a time out was called to verify the correct patient, procedure, equipment, support staff and site/side marked as required. Patient was prepped and draped in the usual sterile fashion.      No notes on file   Clinical History: MRI LUMBAR SPINE WITHOUT CONTRAST  TECHNIQUE: Multiplanar, multisequence MR imaging of the lumbar spine was performed. No intravenous contrast was administered.  COMPARISON:  Lumbar MRI 04/05/2017.  Lumbar radiographs 03/13/2015.  FINDINGS: Segmentation: Normal on the comparison radiographs which is the same numbering system used in 2019.  Alignment: Stable since 2019. Chronic grade 1 anterolisthesis of L4 on L5 with mild straightening of lumbar lordosis elsewhere.  Vertebrae: Chronic degenerative endplate marrow edema at L5-S1. Underlying chronic degenerative endplate marrow signal changes there and at L4-L5. Chronic vacuum disc at both of those levels. Intact visible sacrum and SI joints. No other acute osseous abnormality identified.  Conus medullaris and cauda equina: Conus extends to the T12-L1 level. No lower spinal cord or conus signal abnormality.  Paraspinal and other soft tissues: Negative.  Disc levels:  Mild visible lower thoracic spinal stenosis related to disc space loss with disc bulging and posterior element hypertrophy. This appears not significantly changed since 2019, mild at T10-T11 and T12-L1. There is chronic associated bilateral T11 foraminal stenosis which appears moderate.  T12-L1: Negative and evidence of interbody ankylosis via  a right lateral flowing endplate osteophyte on series 6, image 4.  L1-L2: Flowing anterior endplate osteophyte although unclear whether there is interbody ankylosis at this level. There is mild far lateral disc bulging but no significant stenosis.  L2-L3: Mild circumferential disc bulge and posterior element hypertrophy. Increased epidural lipomatosis and mild spinal stenosis at this level since 2019. Mild left L2 foraminal stenosis appears stable.  L3-L4: Chronic circumferential disc bulge and moderate posterior element hypertrophy. Chronic degenerative facet joint fluid at this level. Increased epidural lipomatosis and mild to moderate spinal stenosis since 2019. Mild to moderate bilateral L3 foraminal stenosis is greater on the left and appears stable.  L4-L5: Chronic severe disc space loss in the setting of grade 1 anterolisthesis. Bulky circumferential disc osteophyte complex and facet hypertrophy. Chronic moderate to severe spinal and bilateral lateral recess stenosis. Chronic mild to moderate left and moderate to severe right L4 foraminal stenosis. This level appears stable.  L5-S1: Chronic severe disc degeneration with vacuum disc, disc space loss and bulky circumferential disc osteophyte complex. Mild to moderate facet hypertrophy. No significant spinal stenosis but moderate to severe chronic lateral recess stenosis greater on the left (S1 nerve levels). Moderate to severe left greater than right L5 foraminal stenosis. This level is stable.  IMPRESSION: 1. Epidural lipomatosis contributing to increased mild to moderate spinal stenosis since 2019 at L2-L3 and L3-L4. 2. Chronic severe disc and endplate degeneration at L4-L5 and L5-S1 appears stable along with: -L4-L5 grade 1 anterolisthesis, moderate to severe spinal, lateral recess, and right foraminal stenosis. -L5-S1 left greater than right moderate to severe lateral recess and bilateral foraminal stenosis.  Chronic 3. Mild lower thoracic spinal stenosis at T10-T11 and T11-T12 appears stable. Evidence of adjacent T12-L1 interbody ankylosis.   Electronically Signed   By: Genevie Ann M.D.   On: 02/04/2019 20:54   She reports that she has never smoked. She has never used smokeless tobacco. No results for input(s): HGBA1C, LABURIC in the last 8760 hours.  Objective:  VS:  HT:5\' 6"  (167.6 cm)   WT:270 lb (122.5 kg)  BMI:43.6    BP:(!) 150/74  HR:74bpm  TEMP: ( )  RESP:  Physical Exam Vitals and nursing note reviewed.  Constitutional:      General: She is not in acute distress.    Appearance: Normal appearance. She is well-developed. She is obese. She is not ill-appearing.  HENT:     Head: Normocephalic and atraumatic.  Eyes:     Conjunctiva/sclera: Conjunctivae normal.     Pupils: Pupils are equal, round, and  reactive to light.  Cardiovascular:     Rate and Rhythm: Normal rate.     Pulses: Normal pulses.  Pulmonary:     Effort: Pulmonary effort is normal.  Musculoskeletal:     Right lower leg: No edema.     Left lower leg: No edema.     Comments: Patient is slow to rise from a seated position to full extension.  She does not have any pain over the greater trochanters.  Some pain across the lower back to palpation.  She has good distal strength.  She ambulates without aid without foot drop.  Left-sided hip has pretty good range of motion seems to have endrange pain with internal rotation in the groin.  Skin:    General: Skin is warm and dry.     Findings: No erythema or rash.  Neurological:     General: No focal deficit present.     Mental Status: She is alert and oriented to person, place, and time.     Sensory: No sensory deficit.     Motor: No abnormal muscle tone.     Coordination: Coordination normal.     Gait: Gait normal.  Psychiatric:        Mood and Affect: Mood normal.        Behavior: Behavior normal.     Ortho Exam Imaging: XR C-ARM NO REPORT  Result Date:  03/02/2019 Please see Notes tab for imaging impression.   Past Medical/Family/Surgical/Social History: Medications & Allergies reviewed per EMR, new medications updated. Patient Active Problem List   Diagnosis Date Noted  . Epidural lipomatosis 03/02/2019  . Spondylolisthesis of lumbar region 03/02/2019  . Spinal stenosis of lumbar region with neurogenic claudication 03/02/2019  . Localized osteoarthritis of left knee 04/03/2018   Past Medical History:  Diagnosis Date  . Arthritis   . Diabetes mellitus   . GERD (gastroesophageal reflux disease)   . Hyperlipemia   . Hypertension   . Seasonal allergies    History reviewed. No pertinent family history. Past Surgical History:  Procedure Laterality Date  . CAPSULOTOMY  01/13/2012   Procedure: MINOR CAPSULOTOMY;  Surgeon: Myrtha Mantis., MD;  Location: [facility];  Service: Ophthalmology;  Laterality: Left;  . DORSAL COMPARTMENT RELEASE  2009   rt   . KNEE ARTHROSCOPY  02,04   right and left  . STERIOD INJECTION  08/01/2011   Procedure: STEROID INJECTION;  Surgeon: Cammie Sickle., MD;  Location: [facility];  Service: Orthopedics;  Laterality: Left;  [facility] left thumb  . TRIGGER FINGER RELEASE  2006   rt thumb  . TRIGGER FINGER RELEASE  08/01/2011   Procedure: RELEASE TRIGGER FINGER/A-1 PULLEY;  Surgeon: Cammie Sickle., MD;  Location: [facility];  Service: Orthopedics;  Laterality: Left;  left thumb  . YAG LASER APPLICATION  85/02/7739   Procedure: YAG LASER APPLICATION;  Surgeon: Myrtha Mantis., MD;  Location: [facility];  Service: Ophthalmology;  Laterality: Left;   Social History   Occupational History  . Not on file  Tobacco Use  . Smoking status: Never Smoker  . Smokeless tobacco: Never Used  Substance and Sexual Activity  . Alcohol use: No  . Drug use: No  . Sexual activity: Not on file

## 2019-03-02 NOTE — Progress Notes (Signed)
 .  Numeric Pain Rating Scale and Functional Assessment Average Pain 9 Pain Right Now 0 My pain is intermittent, dull and aching Pain is worse with: bending and standing Pain improves with: rest   In the last MONTH (on 0-10 scale) has pain interfered with the following?  1. General activity like being  able to carry out your everyday physical activities such as walking, climbing stairs, carrying groceries, or moving a chair?  Rating(7)  2. Relation with others like being able to carry out your usual social activities and roles such as  activities at home, at work and in your community. Rating(7)  3. Enjoyment of life such that you have  been bothered by emotional problems such as feeling anxious, depressed or irritable?  Rating(5)

## 2019-04-22 ENCOUNTER — Ambulatory Visit: Payer: 59 | Attending: Internal Medicine

## 2019-04-22 DIAGNOSIS — Z23 Encounter for immunization: Secondary | ICD-10-CM

## 2019-04-22 NOTE — Progress Notes (Signed)
   Covid-19 Vaccination Clinic  Name:  MALIAKA BRASINGTON    MRN: [id number] DOB: 1955/06/30  04/22/2019  Ms. Faddis was observed post Covid-19 immunization for 15 minutes without incident. She was provided with Vaccine Information Sheet and instruction to access the V-Safe system.   Ms. Noy was instructed to call 911 with any severe reactions post vaccine: Marland Kitchen Difficulty breathing  . Swelling of face and throat  . A fast heartbeat  . A bad rash all over body  . Dizziness and weakness   Immunizations Administered    Name Date Dose VIS Date Route   Pfizer COVID-19 Vaccine 04/22/2019  1:41 PM 0.3 mL 01/01/2019 Intramuscular   Manufacturer: [place]   Lot: [id number]   [place]: [id number]    pt reported no issues

## 2019-05-11 ENCOUNTER — Telehealth: Payer: Self-pay | Admitting: Physical Medicine and Rehabilitation

## 2019-05-11 NOTE — Telephone Encounter (Signed)
Scheduled for 5/5 at 0930.

## 2019-05-11 NOTE — Telephone Encounter (Signed)
Left message #1

## 2019-05-11 NOTE — Telephone Encounter (Signed)
Ok OV plus potential HIp

## 2019-05-11 NOTE — Telephone Encounter (Signed)
Patient would like to make an appointment for right hip pain. She had on OV with you on 03/02/19 and had a left hip injection at that time. Please advise.

## 2019-05-13 ENCOUNTER — Other Ambulatory Visit: Payer: Self-pay | Admitting: Orthopedic Surgery

## 2019-05-13 ENCOUNTER — Telehealth: Payer: Self-pay | Admitting: Orthopedic Surgery

## 2019-05-13 MED ORDER — HYDROCODONE-ACETAMINOPHEN 5-325 MG PO TABS: 1.0000 | ORAL_TABLET | Freq: Four times a day (QID) | ORAL | 0 refills | Status: DC | PRN

## 2019-05-13 NOTE — Telephone Encounter (Signed)
Pt was referred to Dr. Ernestina Patches for hip and LBP had injection X1 03/02/19. She is sch to see him again on 5/521 but wanted to know if you would be willing to refill her Hydrocodone until that appt? Her last refill was 01/2019

## 2019-05-13 NOTE — Telephone Encounter (Signed)
Rx sent 

## 2019-05-13 NOTE — Telephone Encounter (Signed)
Patient called. She would like a refill on hydrocodone. Her call back number is [phone]

## 2019-05-18 ENCOUNTER — Ambulatory Visit: Payer: 59 | Attending: Internal Medicine

## 2019-05-18 DIAGNOSIS — Z23 Encounter for immunization: Secondary | ICD-10-CM

## 2019-05-18 NOTE — Progress Notes (Signed)
   Covid-19 Vaccination Clinic  Name:  Chelsey Lee    MRN: [id number] DOB: 09/18/1955  05/18/2019  [name] was observed post Covid-19 immunization for 15 minutes without incident. She was provided with Vaccine Information Sheet and instruction to access the V-Safe system.   [name] was instructed to call 911 with any severe reactions post vaccine: Marland Kitchen Difficulty breathing  . Swelling of face and throat  . A fast heartbeat  . A bad rash all over body  . Dizziness and weakness   Immunizations Administered    Name Date Dose VIS Date Route   Pfizer COVID-19 Vaccine 05/18/2019  2:02 PM 0.3 mL 03/17/2018 Intramuscular   Manufacturer: [place]   Lot: [id number]   [place]: [id number]

## 2019-05-26 ENCOUNTER — Ambulatory Visit: Payer: Self-pay

## 2019-05-26 ENCOUNTER — Ambulatory Visit: Payer: 59 | Admitting: Physical Medicine and Rehabilitation

## 2019-05-26 ENCOUNTER — Other Ambulatory Visit: Payer: Self-pay

## 2019-05-26 ENCOUNTER — Encounter: Payer: Self-pay | Admitting: Physical Medicine and Rehabilitation

## 2019-05-26 VITALS — BP 145/74 | HR 71 | Ht 66.0 in | Wt 280.0 lb

## 2019-05-26 DIAGNOSIS — D1779 Benign lipomatous neoplasm of other sites: Secondary | ICD-10-CM | POA: Diagnosis not present

## 2019-05-26 DIAGNOSIS — M48062 Spinal stenosis, lumbar region with neurogenic claudication: Secondary | ICD-10-CM

## 2019-05-26 DIAGNOSIS — M25551 Pain in right hip: Secondary | ICD-10-CM

## 2019-05-26 NOTE — Progress Notes (Signed)
Pt states pain in the right hip (groin pain) and radiates into the medial side of the right thigh. Pt states pain started about 2 months ago. Relaxing and waiting helps with pain. Slightly bending over makes pain worse.   .Numeric Pain Rating Scale and Functional Assessment Average Pain 10 Pain Right Now 0 My pain is intermittent, sharp and aching Pain is worse with: bending and standing Pain improves with: rest   In the last MONTH (on 0-10 scale) has pain interfered with the following?  1. General activity like being  able to carry out your everyday physical activities such as walking, climbing stairs, carrying groceries, or moving a chair?  Rating(9)  2. Relation with others like being able to carry out your usual social activities and roles such as  activities at home, at work and in your community. Rating(9)  3. Enjoyment of life such that you have  been bothered by emotional problems such as feeling anxious, depressed or irritable?  Rating(3)   -Dye Allergy

## 2019-05-27 ENCOUNTER — Encounter: Payer: Self-pay | Admitting: Physical Medicine and Rehabilitation

## 2019-05-27 MED ORDER — TRIAMCINOLONE ACETONIDE 40 MG/ML IJ SUSP
60.0000 mg | INTRAMUSCULAR | Status: AC | PRN
  Administered 2019-05-26: 10:00:00 60 mg via INTRA_ARTICULAR

## 2019-05-27 MED ORDER — BUPIVACAINE HCL 0.25 % IJ SOLN
4.0000 mL | INTRAMUSCULAR | Status: AC | PRN
  Administered 2019-05-26: 10:00:00 4 mL via INTRA_ARTICULAR

## 2019-05-27 NOTE — Progress Notes (Signed)
GRACIN SOOHOO - 64 y.o. female MRN [id number]  Date of birth: 03/23/1955  Office Visit Note: Visit Date: 05/26/2019 PCP: Willey Blade, MD Referred by: Willey Blade, MD  Subjective: Chief Complaint  Patient presents with  . Right Hip - Pain  . Right Thigh - Pain   HPI: Chelsey Lee is a 64 y.o. female who comes in today For evaluation and management of chronic worsening back pain with right more than left hip and groin pain.  She is followed from orthopedic standpoint by Dr. Meridee Score.  Her case is complicated by history of morbid obesity with type 2 diabetes which is insulin-dependent.  MRI of the lumbar spine show significant multifactorial stenosis with facet arthritis and epidural lipomatosis.  In the past epidural injection has been helpful to some degree but not long-lasting.  She also has arthritis of both hips.  The last time we saw her we completed left intra-articular hip anesthetic arthrogram with actually good relief of her symptoms at the time.  Today she comes in with worsening back and bilateral hip pain right worse than left.  The right is much worse than left and seems to hurt going from sit to stand and with movement.  Rotation of the hip does not necessarily elicit the same pain.  She gets some referral into the buttock region and into the thigh anteriorly.  Not really much past the knee.  No paresthesia.  No new trauma.  She does take pain medication at times.  She has had physical therapy in the past.  She has not had any prior lumbar surgery.  Review of Systems  Musculoskeletal: Positive for back pain and joint pain.  All other systems reviewed and are negative.  Otherwise per HPI.  Assessment & Plan: Visit Diagnoses:  1. Pain in right hip   2. Spinal stenosis of lumbar region with neurogenic claudication   3. Epidural lipomatosis     Plan: Findings:  1.  Bilateral hip arthritis.  Left hip injection seems to be doing fairly good since I saw her  last.  She does have pain today in the right hip and groin but not really pain with internal rotation and based on exam.  Exams low but difficulty with her body habitus.  She does endorse pain going from sit to stand and with getting out of the car.  I think given the findings of for today we will complete intra-articular hip anesthetic arthrogram on the right.  As of note in the procedure report patient did seem to have some relief during the anesthetic phase.  She will monitor this for the next couple of weeks and then potentially we could look at lumbar injection as described below.  2.  Patient has lumbar spondylosis and lumbar stenosis with epidural lipomatosis.  We do see the epidural lipomatosis and insulin-dependent folks but also in obese people and she has both unfortunately.  She is not really getting pain down past the knee and I think today it seems like this is more hip related but I do feel like she has both problems.  Depending on relief of the hip injection but repeat epidural injection versus transforaminal injection.  Again injections difficult with body habitus.  Depending on all his relief she may need surgical evaluation although this can be difficult again from her exacerbating other medical factors.  May be a candidate for chronic pain management.    Meds & Orders: No orders of the defined types were  placed in this encounter.   Orders Placed This Encounter  Procedures  . Large Joint Inj  . XR C-ARM NO REPORT    Follow-up: Return for Lumbar epidural injection depending on relief.   Procedures: Large Joint Inj: R hip joint on 05/26/2019 9:45 AM Indications: pain and diagnostic evaluation Details: 22 G needle, anterior approach  Arthrogram: Yes  Medications: 4 mL bupivacaine 0.25 %; 60 mg triamcinolone acetonide 40 MG/ML Outcome: tolerated well, no immediate complications  Arthrogram demonstrated excellent flow of contrast throughout the joint surface without extravasation  or obvious defect.  The patient had relief of symptoms during the anesthetic phase of the injection.  Procedure, treatment alternatives, risks and benefits explained, specific risks discussed. Consent was given by the patient. Immediately prior to procedure a time out was called to verify the correct patient, procedure, equipment, support staff and site/side marked as required. Patient was prepped and draped in the usual sterile fashion.      No notes on file   Clinical History: MRI LUMBAR SPINE WITHOUT CONTRAST  TECHNIQUE: Multiplanar, multisequence MR imaging of the lumbar spine was performed. No intravenous contrast was administered.  COMPARISON:  Lumbar MRI 04/05/2017. Lumbar radiographs 03/13/2015.  FINDINGS: Segmentation: Normal on the comparison radiographs which is the same numbering system used in 2019.  Alignment: Stable since 2019. Chronic grade 1 anterolisthesis of L4 on L5 with mild straightening of lumbar lordosis elsewhere.  Vertebrae: Chronic degenerative endplate marrow edema at L5-S1. Underlying chronic degenerative endplate marrow signal changes there and at L4-L5. Chronic vacuum disc at both of those levels. Intact visible sacrum and SI joints. No other acute osseous abnormality identified.  Conus medullaris and cauda equina: Conus extends to the T12-L1 level. No lower spinal cord or conus signal abnormality.  Paraspinal and other soft tissues: Negative.  Disc levels:  Mild visible lower thoracic spinal stenosis related to disc space loss with disc bulging and posterior element hypertrophy. This appears not significantly changed since 2019, mild at T10-T11 and T12-L1. There is chronic associated bilateral T11 foraminal stenosis which appears moderate.  T12-L1: Negative and evidence of interbody ankylosis via a right lateral flowing endplate osteophyte on series 6, image 4.  L1-L2: Flowing anterior endplate osteophyte although unclear  whether there is interbody ankylosis at this level. There is mild far lateral disc bulging but no significant stenosis.  L2-L3: Mild circumferential disc bulge and posterior element hypertrophy. Increased epidural lipomatosis and mild spinal stenosis at this level since 2019. Mild left L2 foraminal stenosis appears stable.  L3-L4: Chronic circumferential disc bulge and moderate posterior element hypertrophy. Chronic degenerative facet joint fluid at this level. Increased epidural lipomatosis and mild to moderate spinal stenosis since 2019. Mild to moderate bilateral L3 foraminal stenosis is greater on the left and appears stable.  L4-L5: Chronic severe disc space loss in the setting of grade 1 anterolisthesis. Bulky circumferential disc osteophyte complex and facet hypertrophy. Chronic moderate to severe spinal and bilateral lateral recess stenosis. Chronic mild to moderate left and moderate to severe right L4 foraminal stenosis. This level appears stable.  L5-S1: Chronic severe disc degeneration with vacuum disc, disc space loss and bulky circumferential disc osteophyte complex. Mild to moderate facet hypertrophy. No significant spinal stenosis but moderate to severe chronic lateral recess stenosis greater on the left (S1 nerve levels). Moderate to severe left greater than right L5 foraminal stenosis. This level is stable.  IMPRESSION: 1. Epidural lipomatosis contributing to increased mild to moderate spinal stenosis since 2019 at L2-L3 and  L3-L4. 2. Chronic severe disc and endplate degeneration at L4-L5 and L5-S1 appears stable along with: -L4-L5 grade 1 anterolisthesis, moderate to severe spinal, lateral recess, and right foraminal stenosis. -L5-S1 left greater than right moderate to severe lateral recess and bilateral foraminal stenosis. Chronic 3. Mild lower thoracic spinal stenosis at T10-T11 and T11-T12 appears stable. Evidence of adjacent T12-L1 interbody  ankylosis.   Electronically Signed   By: Genevie Ann M.D.   On: 02/04/2019 20:54   She reports that she has never smoked. She has never used smokeless tobacco. No results for input(s): HGBA1C, LABURIC in the last 8760 hours.  Objective:  VS:  HT:5\' 6"  (167.6 cm)   WT:280 lb (127 kg)  BMI:45.21    BP:(!) 145/74  HR:71bpm  TEMP: ( )  RESP:  Physical Exam Vitals and nursing note reviewed.  Constitutional:      General: She is not in acute distress.    Appearance: Normal appearance. She is well-developed. She is obese.  HENT:     Head: Normocephalic and atraumatic.     Nose: Nose normal.     Mouth/Throat:     Mouth: Mucous membranes are moist.     Pharynx: Oropharynx is clear.  Eyes:     Conjunctiva/sclera: Conjunctivae normal.     Pupils: Pupils are equal, round, and reactive to light.  Cardiovascular:     Rate and Rhythm: Regular rhythm.  Pulmonary:     Effort: Pulmonary effort is normal. No respiratory distress.  Abdominal:     General: There is no distension.     Palpations: Abdomen is soft.     Tenderness: There is no guarding.  Musculoskeletal:     Cervical back: Normal range of motion and neck supple.     Right lower leg: No edema.     Left lower leg: No edema.     Comments: Patient has some difficulty going sit to stand does have back pain with facet loading.  She has some pain with external rotation of the right hip more than internal.  She has good distal strength.  Skin:    General: Skin is warm and dry.     Findings: No erythema or rash.  Neurological:     General: No focal deficit present.     Mental Status: She is alert and oriented to person, place, and time.     Motor: No abnormal muscle tone.     Coordination: Coordination normal.     Gait: Gait normal.  Psychiatric:        Mood and Affect: Mood normal.        Behavior: Behavior normal.        Thought Content: Thought content normal.     Ortho Exam  Imaging: XR C-ARM NO REPORT  Result Date:  05/26/2019 Please see Notes tab for imaging impression.   Past Medical/Family/Surgical/Social History: Medications & Allergies reviewed per EMR, new medications updated. Patient Active Problem List   Diagnosis Date Noted  . Epidural lipomatosis 03/02/2019  . Spondylolisthesis of lumbar region 03/02/2019  . Spinal stenosis of lumbar region with neurogenic claudication 03/02/2019  . Localized osteoarthritis of left knee 04/03/2018   Past Medical History:  Diagnosis Date  . Arthritis   . Diabetes mellitus   . GERD (gastroesophageal reflux disease)   . Hyperlipemia   . Hypertension   . Seasonal allergies    History reviewed. No pertinent family history. Past Surgical History:  Procedure Laterality Date  . CAPSULOTOMY  01/13/2012  Procedure: MINOR CAPSULOTOMY;  Surgeon: Myrtha Mantis., MD;  Location: [facility];  Service: Ophthalmology;  Laterality: Left;  . DORSAL COMPARTMENT RELEASE  2009   rt   . KNEE ARTHROSCOPY  02,04   right and left  . STERIOD INJECTION  08/01/2011   Procedure: STEROID INJECTION;  Surgeon: Cammie Sickle., MD;  Location: [facility];  Service: Orthopedics;  Laterality: Left;  cmc left thumb  . TRIGGER FINGER RELEASE  2006   rt thumb  . TRIGGER FINGER RELEASE  08/01/2011   Procedure: RELEASE TRIGGER FINGER/A-1 PULLEY;  Surgeon: Cammie Sickle., MD;  Location: [facility];  Service: Orthopedics;  Laterality: Left;  left thumb  . YAG LASER APPLICATION  35/82/5189   Procedure: YAG LASER APPLICATION;  Surgeon: Myrtha Mantis., MD;  Location: [facility];  Service: Ophthalmology;  Laterality: Left;   Social History   Occupational History  . Not on file  Tobacco Use  . Smoking status: Never Smoker  . Smokeless tobacco: Never Used  Substance and Sexual Activity  . Alcohol use: No  . Drug use: No  . Sexual activity: Not on file

## 2019-06-01 ENCOUNTER — Telehealth: Payer: Self-pay | Admitting: Physical Medicine and Rehabilitation

## 2019-06-01 NOTE — Telephone Encounter (Signed)
Patient left a message to report that she has had no more pain since her right hip injection on 5/5.

## 2019-06-01 NOTE — Telephone Encounter (Signed)
good

## 2019-06-29 DIAGNOSIS — N184 Chronic kidney disease, stage 4 (severe): Secondary | ICD-10-CM | POA: Insufficient documentation

## 2019-06-29 DIAGNOSIS — Z794 Long term (current) use of insulin: Secondary | ICD-10-CM | POA: Insufficient documentation

## 2019-08-31 ENCOUNTER — Encounter: Payer: Self-pay | Admitting: Family

## 2019-08-31 ENCOUNTER — Ambulatory Visit (INDEPENDENT_AMBULATORY_CARE_PROVIDER_SITE_OTHER): Payer: 59 | Admitting: Family

## 2019-08-31 VITALS — Ht 66.0 in | Wt 280.0 lb

## 2019-08-31 DIAGNOSIS — M1712 Unilateral primary osteoarthritis, left knee: Secondary | ICD-10-CM | POA: Diagnosis not present

## 2019-08-31 DIAGNOSIS — G8929 Other chronic pain: Secondary | ICD-10-CM

## 2019-08-31 DIAGNOSIS — M25562 Pain in left knee: Secondary | ICD-10-CM

## 2019-08-31 MED ORDER — LIDOCAINE HCL 1 % IJ SOLN
5.0000 mL | INTRAMUSCULAR | Status: AC | PRN
  Administered 2019-08-31: 5 mL

## 2019-08-31 MED ORDER — METHYLPREDNISOLONE ACETATE 40 MG/ML IJ SUSP
40.0000 mg | INTRAMUSCULAR | Status: AC | PRN
  Administered 2019-08-31: 40 mg via INTRA_ARTICULAR

## 2019-08-31 NOTE — Progress Notes (Signed)
Office Visit Note   Patient: Chelsey Lee           Date of Birth: 03/05/1955           MRN: [id number] Visit Date: 08/31/2019              Requested by: Willey Blade, [address] [place],  [place] [address] PCP: Willey Blade, MD  Chief Complaint  Patient presents with  . Left Knee - Pain      HPI: The patient is a 64 year old woman who presents today complaining of left knee pain this is associated swelling pain with flexion and stiffness.  This is been gradually worsening over the last week she states that she was evaluated for her knee in March 2020 had an injection and it did not bother her after the injection.  She did have a fall in January but cannot think of any other recent injury.  She has been active in water aerobics does associate that her knee pain has been worsening as she has continued with her aerobics.  She is having 8 out of 10 pain, stiffness.  No mechanical symptoms.  Stabbing medial joint line pain.  Assessment & Plan: Visit Diagnoses:  1. Chronic pain of left knee   2. Primary osteoarthritis of left knee     Plan: Depo-Medrol injection today.  Patient tolerated well.  She will follow-up in the office in 4 weeks if she has continued issues.  Follow-Up Instructions: Return in about 2 weeks (around 09/14/2019), or if symptoms worsen or fail to improve.   Right Knee Exam   Muscle Strength  The patient has normal right knee strength.  Tenderness  The patient is experiencing tenderness in the medial joint line.  Tests  Varus: negative Valgus: negative  Other  Swelling: moderate Effusion: no effusion present      Patient is alert, oriented, no adenopathy, well-dressed, normal affect, normal respiratory effort.   Imaging: No results found. No images are attached to the encounter.  Labs: No results found for: HGBA1C, ESRSEDRATE, CRP, LABURIC, REPTSTATUS, GRAMSTAIN, CULT, LABORGA   Lab Results  Component Value  Date   ALBUMIN 3.1 (L) 07/20/2015    No results found for: MG No results found for: VD25OH  No results found for: PREALBUMIN CBC EXTENDED Latest Ref Rng & Units 07/20/2015 08/01/2011 07/28/2007  WBC 4.0 - 10.5 K/uL 5.2 - -  RBC 3.87 - 5.11 MIL/uL 2.92(L) - -  HGB 12.0 - 15.0 g/dL 8.3(L) 10.6(L) 12.6  HCT 36 - 46 % 25.9(L) - 37.0  PLT 150 - 400 K/uL 134(L) - -  NEUTROABS 1.7 - 7.7 K/uL 3.0 - -  LYMPHSABS 0.7 - 4.0 K/uL 1.5 - -     Body mass index is 45.19 kg/m.  Orders:  No orders of the defined types were placed in this encounter.  No orders of the defined types were placed in this encounter.    Procedures: Large Joint Inj: L knee on 08/31/2019 1:10 PM Indications: pain Details: 18 G 1.5 in needle, anteromedial approach Medications: 5 mL lidocaine 1 %; 40 mg methylPREDNISolone acetate 40 MG/ML Consent was given by the patient.      Clinical Data: No additional findings.  ROS:  All other systems negative, except as noted in the HPI. Review of Systems  Constitutional: Negative for chills and fever.  Musculoskeletal: Positive for arthralgias, joint swelling and myalgias.    Objective: Vital Signs: Ht 5\' 6"  (1.676 m)  Wt 280 lb (127 kg)   BMI 45.19 kg/m   Specialty Comments:  No specialty comments available.  PMFS History: Patient Active Problem List   Diagnosis Date Noted  . Epidural lipomatosis 03/02/2019  . Spondylolisthesis of lumbar region 03/02/2019  . Spinal stenosis of lumbar region with neurogenic claudication 03/02/2019  . Localized osteoarthritis of left knee 04/03/2018   Past Medical History:  Diagnosis Date  . Arthritis   . Diabetes mellitus   . GERD (gastroesophageal reflux disease)   . Hyperlipemia   . Hypertension   . Seasonal allergies     History reviewed. No pertinent family history.  Past Surgical History:  Procedure Laterality Date  . CAPSULOTOMY  01/13/2012   Procedure: MINOR CAPSULOTOMY;  Surgeon: Myrtha Mantis.,  MD;  Location: [facility];  Service: Ophthalmology;  Laterality: Left;  . DORSAL COMPARTMENT RELEASE  2009   rt   . KNEE ARTHROSCOPY  02,04   right and left  . STERIOD INJECTION  08/01/2011   Procedure: STEROID INJECTION;  Surgeon: Cammie Sickle., MD;  Location: [facility];  Service: Orthopedics;  Laterality: Left;  cmc left thumb  . TRIGGER FINGER RELEASE  2006   rt thumb  . TRIGGER FINGER RELEASE  08/01/2011   Procedure: RELEASE TRIGGER FINGER/A-1 PULLEY;  Surgeon: Cammie Sickle., MD;  Location: [facility];  Service: Orthopedics;  Laterality: Left;  left thumb  . YAG LASER APPLICATION  16/10/9602   Procedure: YAG LASER APPLICATION;  Surgeon: Myrtha Mantis., MD;  Location: [facility];  Service: Ophthalmology;  Laterality: Left;   Social History   Occupational History  . Not on file  Tobacco Use  . Smoking status: Never Smoker  . Smokeless tobacco: Never Used  Substance and Sexual Activity  . Alcohol use: No  . Drug use: No  . Sexual activity: Not on file

## 2019-09-20 ENCOUNTER — Encounter: Payer: Self-pay | Admitting: Orthopedic Surgery

## 2019-09-20 ENCOUNTER — Ambulatory Visit (INDEPENDENT_AMBULATORY_CARE_PROVIDER_SITE_OTHER): Payer: 59 | Admitting: Physician Assistant

## 2019-09-20 ENCOUNTER — Telehealth: Payer: Self-pay | Admitting: Radiology

## 2019-09-20 VITALS — Ht 66.0 in | Wt 297.0 lb

## 2019-09-20 DIAGNOSIS — M722 Plantar fascial fibromatosis: Secondary | ICD-10-CM | POA: Diagnosis not present

## 2019-09-20 NOTE — Telephone Encounter (Signed)
Patient called and states that she was told to get a "SOL" brace, I did not see anything documented in the OV note from today about this, she states that she would like to know what it is and where to get it. If someone can please call her back to advise.

## 2019-09-20 NOTE — Telephone Encounter (Signed)
SOL Arch Supports. Told her may be tough to get here in town but could try online.

## 2019-09-20 NOTE — Telephone Encounter (Signed)
Pt states that she was advised today to get arch supports wants to know the specific kind you suggested and where she can get them.

## 2019-09-20 NOTE — Progress Notes (Signed)
Office Visit Note   Patient: Ruta Hinds           Date of Birth: [birth year]           MRN: [id number] Visit Date: 09/20/2019              Requested by: Willey Blade, [address] [place],  [place] [address] PCP: Willey Blade, MD  Chief Complaint  Patient presents with  . Right Foot - Pain      HPI: This is a pleasant 64 year old woman who comes in with a 1 week history of right heel pain.  She states it starts at the bottom of her heel radiate up behind her heel into her Achilles tendon.  She has no known injury.  The only change she can attribute to is wearing new shoes without any support. Assessment & Plan: Visit Diagnoses: No diagnosis found.  Plan: Findings consistent with some Achilles contracture buying with pes planus and plantar fasciitis.  I do think she needs to get a arch support and I have written down so arch supports for her.  She could also try a [employer]oka shoe which would be more supportive for her.  I demonstrated weightbearing Achilles stretching to her.  She will follow-up in 4 weeks for reevaluation.  Follow-Up Instructions: No follow-ups on file.   Ortho Exam  Patient is alert, oriented, no adenopathy, well-dressed, normal affect, normal respiratory effort. Focused examination she does have some significant pes planus.  She is not tender along the posterior tibial tendon she has some tenderness in the bottom of the heel extending up into the Achilles tendon.  The Achilles tendon is intact.  She has good plantar flexion dorsiflexion eversion and inversion.  No lateral impingement findings.  Pulses are intact.  No cellulitis  Imaging: No results found. No images are attached to the encounter.  Labs: No results found for: HGBA1C, ESRSEDRATE, CRP, LABURIC, REPTSTATUS, GRAMSTAIN, CULT, LABORGA   Lab Results  Component Value Date   ALBUMIN 3.1 (L) 07/20/2015    No results found for: MG No results found for: VD25OH  No results  found for: PREALBUMIN CBC EXTENDED Latest Ref Rng & Units 07/20/2015 08/01/2011 07/28/2007  WBC 4.0 - 10.5 K/uL 5.2 - -  RBC 3.87 - 5.11 MIL/uL 2.92(L) - -  HGB 12.0 - 15.0 g/dL 8.3(L) 10.6(L) 12.6  HCT 36 - 46 % 25.9(L) - 37.0  PLT 150 - 400 K/uL 134(L) - -  NEUTROABS 1.7 - 7.7 K/uL 3.0 - -  LYMPHSABS 0.7 - 4.0 K/uL 1.5 - -     Body mass index is 47.94 kg/m.  Orders:  No orders of the defined types were placed in this encounter.  No orders of the defined types were placed in this encounter.    Procedures: No procedures performed  Clinical Data: No additional findings.  ROS:  All other systems negative, except as noted in the HPI. Review of Systems  Objective: Vital Signs: Ht 5\' 6"  (1.676 m)   Wt 297 lb (134.7 kg)   BMI 47.94 kg/m   Specialty Comments:  No specialty comments available.  PMFS History: Patient Active Problem List   Diagnosis Date Noted  . Epidural lipomatosis 03/02/2019  . Spondylolisthesis of lumbar region 03/02/2019  . Spinal stenosis of lumbar region with neurogenic claudication 03/02/2019  . Localized osteoarthritis of left knee 04/03/2018   Past Medical History:  Diagnosis Date  . Arthritis   . Diabetes mellitus   .  GERD (gastroesophageal reflux disease)   . Hyperlipemia   . Hypertension   . Seasonal allergies     History reviewed. No pertinent family history.  Past Surgical History:  Procedure Laterality Date  . CAPSULOTOMY  01/13/2012   Procedure: MINOR CAPSULOTOMY;  Surgeon: Myrtha Mantis., MD;  Location: [facility];  Service: Ophthalmology;  Laterality: Left;  . DORSAL COMPARTMENT RELEASE  2009   rt   . KNEE ARTHROSCOPY  02,04   right and left  . STERIOD INJECTION  08/01/2011   Procedure: STEROID INJECTION;  Surgeon: Cammie Sickle., MD;  Location: [facility];  Service: Orthopedics;  Laterality: Left;  cmc left thumb  . TRIGGER FINGER RELEASE  2006   rt thumb  . TRIGGER FINGER RELEASE  08/01/2011    Procedure: RELEASE TRIGGER FINGER/A-1 PULLEY;  Surgeon: Cammie Sickle., MD;  Location: [facility];  Service: Orthopedics;  Laterality: Left;  left thumb  . YAG LASER APPLICATION  83/81/8403   Procedure: YAG LASER APPLICATION;  Surgeon: Myrtha Mantis., MD;  Location: [facility];  Service: Ophthalmology;  Laterality: Left;   Social History   Occupational History  . Not on file  Tobacco Use  . Smoking status: Never Smoker  . Smokeless tobacco: Never Used  Substance and Sexual Activity  . Alcohol use: No  . Drug use: No  . Sexual activity: Not on file

## 2019-10-01 ENCOUNTER — Telehealth: Payer: Self-pay | Admitting: Orthopedic Surgery

## 2019-10-01 NOTE — Telephone Encounter (Signed)
Please advise 

## 2019-10-01 NOTE — Telephone Encounter (Signed)
IC patient and advised of this per below.

## 2019-10-01 NOTE — Telephone Encounter (Signed)
Patient called requesting pain medication for ankle pains. Patient states that she unable to take Asprin due to kidney problems and tylenol. Patient state tylenol is not touching the pain. Please send medication to pharmacy on file. Patient phone number is [phone].

## 2019-10-01 NOTE — Telephone Encounter (Signed)
I cannot prescribe narcotics for this particular condition if she is hurting that much she should come back and see [place] next week.  Tylenol should not affect her kidneys but certainly she could run this by her primary care physician

## 2019-10-05 ENCOUNTER — Ambulatory Visit: Payer: 59 | Admitting: Family

## 2019-10-11 ENCOUNTER — Ambulatory Visit (INDEPENDENT_AMBULATORY_CARE_PROVIDER_SITE_OTHER): Payer: 59 | Admitting: Physician Assistant

## 2019-10-11 ENCOUNTER — Encounter: Payer: Self-pay | Admitting: Orthopedic Surgery

## 2019-10-11 VITALS — Ht 66.0 in | Wt 297.0 lb

## 2019-10-11 DIAGNOSIS — M722 Plantar fascial fibromatosis: Secondary | ICD-10-CM

## 2019-10-11 NOTE — Progress Notes (Signed)
Office Visit Note   Patient: Ruta Hinds           Date of Birth: [birth year]           MRN: [id number] Visit Date: 10/11/2019              Requested by: Willey Blade, [address] [place],  [place] [address] PCP: Willey Blade, MD  Chief Complaint  Patient presents with  . Right Foot - Follow-up      HPI: Patient is here in follow-up for right heel pain.  At her last visit she was diagnosed with plantar fasciitis which she has had heel spurs in the past.  She has changed her shoe year and got an arch support.  She also states she has been doing the stretching we talked about.  She has had some improvement but still painful  Assessment & Plan: Visit Diagnoses: No diagnosis found.  Plan: Patient will follow up in 1 month she understands she will needs to do stretching.  The alternative would be Achilles release.  Follow-Up Instructions: No follow-ups on file.   Ortho Exam  Patient is alert, oriented, no adenopathy, well-dressed, normal affect, normal respiratory effort. Focused examination demonstrates no swelling no open areas she does have pes planus.  She does have a new paresthesias that are slightly flexible but also has a good arch support.  CMS is intact.  Tender over the plantar insertion of the plantar fascia into the heel  Imaging: No results found. No images are attached to the encounter.  Labs: No results found for: HGBA1C, ESRSEDRATE, CRP, LABURIC, REPTSTATUS, GRAMSTAIN, CULT, LABORGA   Lab Results  Component Value Date   ALBUMIN 3.1 (L) 07/20/2015    No results found for: MG No results found for: VD25OH  No results found for: PREALBUMIN CBC EXTENDED Latest Ref Rng & Units 07/20/2015 08/01/2011 07/28/2007  WBC 4.0 - 10.5 K/uL 5.2 - -  RBC 3.87 - 5.11 MIL/uL 2.92(L) - -  HGB 12.0 - 15.0 g/dL 8.3(L) 10.6(L) 12.6  HCT 36 - 46 % 25.9(L) - 37.0  PLT 150 - 400 K/uL 134(L) - -  NEUTROABS 1.7 - 7.7 K/uL 3.0 - -  LYMPHSABS 0.7 -  4.0 K/uL 1.5 - -     Body mass index is 47.94 kg/m.  Orders:  No orders of the defined types were placed in this encounter.  No orders of the defined types were placed in this encounter.    Procedures: No procedures performed  Clinical Data: No additional findings.  ROS:  All other systems negative, except as noted in the HPI. Review of Systems  Objective: Vital Signs: Ht 5\' 6"  (1.676 m)   Wt 297 lb (134.7 kg)   BMI 47.94 kg/m   Specialty Comments:  No specialty comments available.  PMFS History: Patient Active Problem List   Diagnosis Date Noted  . Epidural lipomatosis 03/02/2019  . Spondylolisthesis of lumbar region 03/02/2019  . Spinal stenosis of lumbar region with neurogenic claudication 03/02/2019  . Localized osteoarthritis of left knee 04/03/2018   Past Medical History:  Diagnosis Date  . Arthritis   . Diabetes mellitus   . GERD (gastroesophageal reflux disease)   . Hyperlipemia   . Hypertension   . Seasonal allergies     No family history on file.  Past Surgical History:  Procedure Laterality Date  . CAPSULOTOMY  01/13/2012   Procedure: MINOR CAPSULOTOMY;  Surgeon: Myrtha Mantis., MD;  Location:  [facility];  Service: Ophthalmology;  Laterality: Left;  . DORSAL COMPARTMENT RELEASE  2009   rt   . KNEE ARTHROSCOPY  02,04   right and left  . STERIOD INJECTION  08/01/2011   Procedure: STEROID INJECTION;  Surgeon: Cammie Sickle., MD;  Location: [facility];  Service: Orthopedics;  Laterality: Left;  cmc left thumb  . TRIGGER FINGER RELEASE  2006   rt thumb  . TRIGGER FINGER RELEASE  08/01/2011   Procedure: RELEASE TRIGGER FINGER/A-1 PULLEY;  Surgeon: Cammie Sickle., MD;  Location: [facility];  Service: Orthopedics;  Laterality: Left;  left thumb  . YAG LASER APPLICATION  18/56/3149   Procedure: YAG LASER APPLICATION;  Surgeon: Myrtha Mantis., MD;  Location: [facility];  Service: Ophthalmology;   Laterality: Left;   Social History   Occupational History  . Not on file  Tobacco Use  . Smoking status: Never Smoker  . Smokeless tobacco: Never Used  Substance and Sexual Activity  . Alcohol use: No  . Drug use: No  . Sexual activity: Not on file

## 2019-11-03 ENCOUNTER — Ambulatory Visit: Payer: 59 | Admitting: Skilled Nursing Facility1

## 2019-11-05 ENCOUNTER — Ambulatory Visit: Payer: 59 | Admitting: Registered"

## 2019-11-08 ENCOUNTER — Other Ambulatory Visit: Payer: Self-pay

## 2019-11-08 ENCOUNTER — Encounter: Payer: Self-pay | Admitting: Registered"

## 2019-11-08 ENCOUNTER — Encounter: Payer: 59 | Attending: Nephrology | Admitting: Registered"

## 2019-11-08 DIAGNOSIS — E119 Type 2 diabetes mellitus without complications: Secondary | ICD-10-CM | POA: Diagnosis present

## 2019-11-08 NOTE — Progress Notes (Addendum)
Diabetes Self-Management Education  Visit Type: First/Initial  Appt. Start Time: 1405 Appt. End Time: 1500  11/10/2019  [name], identified by name and date of birth, is a 64 y.o. female with a diagnosis of Diabetes: Type 2.   Pt states she wants 3 of her doctors on her healthcare team to receive notes from today's nutrition visit: Dr. Carolin Lee Nephrologist, Dr. Karlton Lee PCP & Dr. Forde Lee endocrinologist  ASSESSMENT  There were no vitals taken for this visit. There is no height or weight on file to calculate BMI.   Patient states she does not have internet access and wants to sign up for a newsletter or magazine to receive in the mail.   Pt states she is more interested in dietary counseling for kidney disease than for diabetes. Per lab included in referral eGFR is 16.  RD included education on blood sugar control and limiting sodium intake. Although she didn't say directly, patient may not have been satisfied with appointment, she did not schedule follow-up visit.  Fall risk: Patient states she has fallen 2x and doctor's office 2x. One time after she left the MD office she fell because it was dark in the parking long and because it time that everything was closing not many people around and she waited 2 hrs before someone from the cleaning crew found her on the ground where she couldn't get up. She said the doctors office did followed up with her after the incident.   Physical activity: Pt states she took aquatic classes at the [facility] last June and July but stopped because she wants to keep more social distance. Pt states she does not like to do aquatic activity in the winter, but plans to go next summer. Pt sates she has a little garden, getting ready to plant collard greens. Pt states because she is unsteady she is afraid to walk on uneven surfaces and when it is dark. Pt also reports she avoids steps due to knee pain.   Pt states she started Victoza in September and has not  noticed side effects or other changes since starting.   Patient states she eats what she cooks for her mother, and bases timing of meals, around 5-6 pm, on her mother's schedule. Pt states she watches television until 3 am.  Hypoglycemia: Pt reports hypoglycemic events, usually during the night with symptom of sweating, Pt states she ate peanut butter and jelly sandwich when blood sugar was 74 & 62 mg/dL  CKD: Pt states she tries to avoid salt. Included in patient reported sample meals were [place] sausage, egg but doesn't eat biscuit. RD showed patient how to calculate sodium intake based on her typical diet and ways she can make changes to reduce sodium.   Diabetes Self-Management Education - 11/08/19 1415      Visit Information   Visit Type First/Initial      Initial Visit   Diabetes Type Type 2    Are you currently following a meal plan? No    Are you taking your medications as prescribed? Yes   victoza, Novolin 70/30   Date Diagnosed [place]   How would you rate your overall health? Fair      Psychosocial Assessment   Patient Belief/Attitude about Diabetes Motivated to manage diabetes    How often do you need to have someone help you when you read instructions, pamphlets, or other written materials from your doctor or pharmacy? 1 - Never  What is the last grade level you completed in school? 2 yrs college      Complications   Last HgB A1C per patient/outside source 6.8 %   per patient October 2021   How often do you check your blood sugar? 1-2 times/day    Fasting Blood glucose range (mg/dL) --   62-200; 136 average   Postprandial Blood glucose range (mg/dL) --   189-200   Have you had a dilated eye exam in the past 12 months? Yes    Have you had a dental exam in the past 12 months? No    Are you checking your feet? Yes    How many days per week are you checking your feet? 7      Dietary Intake   Snack (morning) none    Lunch 2 pieces of  toast, egg, 3 pieces [place] bacon, apple sauce    Dinner broccoli, greens, chicken breast, corn bread   6 pm   Snack (evening) sugar free jello OR fruit    Beverage(s) water, flavored water [employer]      Exercise   Exercise Type ADL's      Patient Education   Previous Diabetes Education Yes (please comment)   NDES, but patient doesn't remember   Nutrition management  Role of diet in the treatment of diabetes and the relationship between the three main macronutrients and blood glucose level;Food label reading, portion sizes and measuring food.    Physical activity and exercise  Role of exercise on diabetes management, blood pressure control and cardiac health.    Psychosocial adjustment Role of stress on diabetes      Individualized Goals (developed by patient)   Nutrition General guidelines for healthy choices and portions discussed;Other (comment)   call number provided for diabetes resources including recipes   Reducing Risk Other (comment)   salt intake goal 1500 mg/day     Outcomes   Expected Outcomes Demonstrated interest in learning. Expect positive outcomes    Future DMSE PRN    Program Status Completed           Individualized Plan for Diabetes Self-Management Training:   Learning Objective:  Patient will have a greater understanding of diabetes self-management. Patient education plan is to attend individual and/or group sessions per assessed needs and concerns.   Patient Instructions  1-800-DIABETES ([phone] [phone]) to see if they offer diabetes magazines Aim to eat balanced meals with following MyPlate and next time we can talk more specifically about carbs. Follow the tips for eating less sodium on the kidney friendly myplate and sodium handout and seasoning ideas without sodium. Continue avoiding dark sodas   Expected Outcomes:  Demonstrated interest in learning. Expect positive outcomes  Education material provided: No sodium seasonings, CDC handout [place],  MyPlate Kidney friendly placemat handout.  If problems or questions, patient to contact team via:  Phone  Future DSME appointment: PRN

## 2019-11-08 NOTE — Patient Instructions (Addendum)
1-800-DIABETES [phone]) to see if they offer diabetes magazines Aim to eat balanced meals with following MyPlate and next time we can talk more specifically about carbs. Follow the tips for eating less sodium on the kidney friendly myplate and sodium handout and seasoning ideas without sodium. Continue avoiding dark sodas

## 2019-11-10 DIAGNOSIS — E119 Type 2 diabetes mellitus without complications: Secondary | ICD-10-CM | POA: Insufficient documentation

## 2019-12-23 ENCOUNTER — Telehealth: Payer: Self-pay | Admitting: Physical Medicine and Rehabilitation

## 2019-12-23 NOTE — Telephone Encounter (Signed)
Ok

## 2019-12-23 NOTE — Telephone Encounter (Signed)
Patient called. She would like an appointment with Dr. Ernestina Patches. Her call back number is [phone]

## 2019-12-23 NOTE — Telephone Encounter (Signed)
Pt had a Lt L5-S1 IL on 11/09/18. [place] for repeat, same pain and side, no new injury?

## 2019-12-24 NOTE — Telephone Encounter (Signed)
Pt insurance PENDING.

## 2019-12-27 NOTE — Telephone Encounter (Signed)
Pt still PENDING

## 2019-12-28 ENCOUNTER — Telehealth: Payer: Self-pay

## 2019-12-28 NOTE — Telephone Encounter (Signed)
Called pt to inform her that insurance is still PENDING and I will call her back once its approve.

## 2019-12-28 NOTE — Telephone Encounter (Signed)
Patient called she wants to schedule a appointment with Dr.Newton. CB:[phone]

## 2019-12-29 NOTE — Telephone Encounter (Signed)
Pt insurance Denial sent over extra Clinic Notes.

## 2020-01-04 NOTE — Telephone Encounter (Signed)
Pt was approve Ref# [id number]. Called pt and LVM #1

## 2020-01-05 NOTE — Telephone Encounter (Signed)
Called pt and sch 02/02/20.

## 2020-01-06 ENCOUNTER — Ambulatory Visit (INDEPENDENT_AMBULATORY_CARE_PROVIDER_SITE_OTHER): Payer: 59 | Admitting: Orthopedic Surgery

## 2020-01-06 ENCOUNTER — Ambulatory Visit (INDEPENDENT_AMBULATORY_CARE_PROVIDER_SITE_OTHER): Payer: 59

## 2020-01-06 ENCOUNTER — Encounter: Payer: Self-pay | Admitting: Orthopedic Surgery

## 2020-01-06 DIAGNOSIS — M25521 Pain in right elbow: Secondary | ICD-10-CM

## 2020-01-06 NOTE — Progress Notes (Signed)
Office Visit Note   Patient: Chelsey Lee           Date of Birth: Jun 15, 1955           MRN: [id number] Visit Date: 01/06/2020              Requested by: Willey Blade, [address] [place],  [place] [address] PCP: Willey Blade, MD  Chief Complaint  Patient presents with  . Right Elbow - Pain      HPI: Patient is a 64 year old woman who states that she fell on her right elbow complains of pain.  Assessment & Plan: Visit Diagnoses:  1. Pain in right elbow     Plan: Radiograph shows no fracture there is no open wounds.  Recommended compression topical anti-inflammatories and ice.  Follow-Up Instructions: No follow-ups on file.   Ortho Exam  Patient is alert, oriented, no adenopathy, well-dressed, normal affect, normal respiratory effort. Examination there is no pain with passive range of motion of the elbow no pain with supination or pronation of the wrist no pain to palpation over the radial head with supination and pronation.  Imaging: XR Elbow 2 Views Right  Result Date: 01/06/2020 2 view radiographs of the right elbow shows no fractures no radial neck fracture no effusion negative fat pad.  No images are attached to the encounter.  Labs: No results found for: HGBA1C, ESRSEDRATE, CRP, LABURIC, REPTSTATUS, GRAMSTAIN, CULT, LABORGA   Lab Results  Component Value Date   ALBUMIN 3.1 (L) 07/20/2015    No results found for: MG No results found for: VD25OH  No results found for: PREALBUMIN CBC EXTENDED Latest Ref Rng & Units 07/20/2015 08/01/2011 07/28/2007  WBC 4.0 - 10.5 K/uL 5.2 - -  RBC 3.87 - 5.11 MIL/uL 2.92(L) - -  HGB 12.0 - 15.0 g/dL 8.3(L) 10.6(L) 12.6  HCT 36.0 - 46.0 % 25.9(L) - 37.0  PLT 150 - 400 K/uL 134(L) - -  NEUTROABS 1.7 - 7.7 K/uL 3.0 - -  LYMPHSABS 0.7 - 4.0 K/uL 1.5 - -     There is no height or weight on file to calculate BMI.  Orders:  Orders Placed This Encounter  Procedures  . XR Elbow 2 Views Right    No orders of the defined types were placed in this encounter.    Procedures: No procedures performed  Clinical Data: No additional findings.  ROS:  All other systems negative, except as noted in the HPI. Review of Systems  Objective: Vital Signs: There were no vitals taken for this visit.  Specialty Comments:  No specialty comments available.  PMFS History: Patient Active Problem List   Diagnosis Date Noted  . Controlled type 2 diabetes mellitus without complication ([facility]) 99/37/1696  . Epidural lipomatosis 03/02/2019  . Spondylolisthesis of lumbar region 03/02/2019  . Spinal stenosis of lumbar region with neurogenic claudication 03/02/2019  . Localized osteoarthritis of left knee 04/03/2018   Past Medical History:  Diagnosis Date  . Arthritis   . Diabetes mellitus   . GERD (gastroesophageal reflux disease)   . Hyperlipemia   . Hypertension   . Seasonal allergies     History reviewed. No pertinent family history.  Past Surgical History:  Procedure Laterality Date  . CAPSULOTOMY  01/13/2012   Procedure: MINOR CAPSULOTOMY;  Surgeon: Myrtha Mantis., MD;  Location: [facility];  Service: Ophthalmology;  Laterality: Left;  . DORSAL COMPARTMENT RELEASE  2009   rt   . KNEE ARTHROSCOPY  02,04   right and left  . STERIOD INJECTION  08/01/2011   Procedure: STEROID INJECTION;  Surgeon: Cammie Sickle., MD;  Location: [facility];  Service: Orthopedics;  Laterality: Left;  cmc left thumb  . TRIGGER FINGER RELEASE  2006   rt thumb  . TRIGGER FINGER RELEASE  08/01/2011   Procedure: RELEASE TRIGGER FINGER/A-1 PULLEY;  Surgeon: Cammie Sickle., MD;  Location: [facility];  Service: Orthopedics;  Laterality: Left;  left thumb  . YAG LASER APPLICATION  35/67/0141   Procedure: YAG LASER APPLICATION;  Surgeon: Myrtha Mantis., MD;  Location: [facility];  Service: Ophthalmology;  Laterality: Left;   Social History   Occupational History   . Not on file  Tobacco Use  . Smoking status: Never Smoker  . Smokeless tobacco: Never Used  Substance and Sexual Activity  . Alcohol use: No  . Drug use: No  . Sexual activity: Not on file

## 2020-01-25 ENCOUNTER — Other Ambulatory Visit: Payer: Self-pay

## 2020-01-25 DIAGNOSIS — E559 Vitamin D deficiency, unspecified: Secondary | ICD-10-CM | POA: Insufficient documentation

## 2020-01-25 DIAGNOSIS — Z860100 Personal history of colon polyps, unspecified: Secondary | ICD-10-CM | POA: Insufficient documentation

## 2020-01-25 DIAGNOSIS — G4733 Obstructive sleep apnea (adult) (pediatric): Secondary | ICD-10-CM | POA: Insufficient documentation

## 2020-01-25 DIAGNOSIS — E785 Hyperlipidemia, unspecified: Secondary | ICD-10-CM | POA: Insufficient documentation

## 2020-01-25 DIAGNOSIS — G4730 Sleep apnea, unspecified: Secondary | ICD-10-CM | POA: Insufficient documentation

## 2020-01-25 DIAGNOSIS — J309 Allergic rhinitis, unspecified: Secondary | ICD-10-CM | POA: Insufficient documentation

## 2020-01-25 DIAGNOSIS — I129 Hypertensive chronic kidney disease with stage 1 through stage 4 chronic kidney disease, or unspecified chronic kidney disease: Secondary | ICD-10-CM | POA: Insufficient documentation

## 2020-01-25 DIAGNOSIS — Z1211 Encounter for screening for malignant neoplasm of colon: Secondary | ICD-10-CM | POA: Insufficient documentation

## 2020-01-25 DIAGNOSIS — K219 Gastro-esophageal reflux disease without esophagitis: Secondary | ICD-10-CM | POA: Insufficient documentation

## 2020-01-25 DIAGNOSIS — N189 Chronic kidney disease, unspecified: Secondary | ICD-10-CM

## 2020-01-31 ENCOUNTER — Telehealth: Payer: Self-pay | Admitting: Physical Medicine and Rehabilitation

## 2020-01-31 NOTE — Telephone Encounter (Signed)
Called pt back.

## 2020-01-31 NOTE — Telephone Encounter (Signed)
Patient called. She has a question about her appointment. Would like Loma Sousa or Sunday Corn to call her,. [phone]

## 2020-02-01 ENCOUNTER — Ambulatory Visit: Payer: 59 | Admitting: Physical Medicine and Rehabilitation

## 2020-02-02 ENCOUNTER — Ambulatory Visit (INDEPENDENT_AMBULATORY_CARE_PROVIDER_SITE_OTHER): Payer: 59 | Admitting: Physical Medicine and Rehabilitation

## 2020-02-02 ENCOUNTER — Other Ambulatory Visit: Payer: Self-pay

## 2020-02-02 ENCOUNTER — Ambulatory Visit: Payer: Self-pay

## 2020-02-02 ENCOUNTER — Encounter: Payer: Self-pay | Admitting: Physical Medicine and Rehabilitation

## 2020-02-02 VITALS — BP 154/68 | HR 66

## 2020-02-02 DIAGNOSIS — G8929 Other chronic pain: Secondary | ICD-10-CM | POA: Diagnosis not present

## 2020-02-02 DIAGNOSIS — M25552 Pain in left hip: Secondary | ICD-10-CM | POA: Diagnosis not present

## 2020-02-02 MED ORDER — BETAMETHASONE SOD PHOS & ACET 6 (3-3) MG/ML IJ SUSP: 12.0000 mg | Freq: Once | INTRAMUSCULAR | Status: DC

## 2020-02-02 NOTE — Progress Notes (Signed)
Pt state left hip pain that travels to her left groin area to her thigh. Pt state standing and walking for a long period of time makes the pain worse. Pt state she sits to rest to help ease the pain.  Numeric Pain Rating Scale and Functional Assessment Average Pain 0   In the last MONTH (on 0-10 scale) has pain interfered with the following?  1. General activity like being  able to carry out your everyday physical activities such as walking, climbing stairs, carrying groceries, or moving a chair?  Rating(10)   +Driver, -BT, -Dye Allergies.

## 2020-02-02 NOTE — Patient Instructions (Signed)

## 2020-02-02 NOTE — Progress Notes (Signed)
Chelsey Lee - 65 y.o. female MRN [id number]  Date of birth: [birth year]  Office Visit Note: Visit Date: 02/02/2020 PCP: Willey Blade, MD Referred by: Willey Blade, MD  Subjective: Chief Complaint  Patient presents with  . Left Hip - Pain  . Left Thigh - Pain   HPI:  Chelsey Lee is a 65 y.o. female who comes in today for planned repeat Left anesthetic hip arthrogram with fluoroscopic guidance.  The patient has failed conservative care including home exercise, medications, time and activity modification. Prior injection gave more than 50% relief for several months. This injection will be diagnostic and hopefully therapeutic.  Please see requesting physician notes for further details and justification.  Referring: Dr. Meridee Score    ROS Otherwise per HPI.  Assessment & Plan: Visit Diagnoses:    ICD-10-CM   1. Hip pain, chronic, left  M25.552 XR C-ARM NO REPORT   G89.29 Large Joint Inj: L hip joint    DISCONTINUED: betamethasone acetate-betamethasone sodium phosphate (CELESTONE) injection 12 mg    CANCELED: Epidural Steroid injection    Plan: No additional findings.   Meds & Orders:  Meds ordered this encounter  Medications  . DISCONTD: betamethasone acetate-betamethasone sodium phosphate (CELESTONE) injection 12 mg    Orders Placed This Encounter  Procedures  . Large Joint Inj: L hip joint  . XR C-ARM NO REPORT    Follow-up: Return if symptoms worsen or fail to improve.   Procedures: Large Joint Inj: L hip joint on 02/02/2020 1:46 PM Indications: diagnostic evaluation and pain Details: 22 G 3.5 in needle, fluoroscopy-guided anterior approach  Arthrogram: No  Medications: 4 mL bupivacaine 0.25 %; 60 mg triamcinolone acetonide 40 MG/ML Outcome: tolerated well, no immediate complications  There was excellent flow of contrast producing a partial arthrogram of the hip. The patient did have relief of symptoms during the anesthetic phase of the  injection. Procedure, treatment alternatives, risks and benefits explained, specific risks discussed. Consent was given by the patient. Immediately prior to procedure a time out was called to verify the correct patient, procedure, equipment, support staff and site/side marked as required. Patient was prepped and draped in the usual sterile fashion.          Clinical History: MRI LUMBAR SPINE WITHOUT CONTRAST  TECHNIQUE: Multiplanar, multisequence MR imaging of the lumbar spine was performed. No intravenous contrast was administered.  COMPARISON:  Lumbar MRI 04/05/2017. Lumbar radiographs 03/13/2015.  FINDINGS: Segmentation: Normal on the comparison radiographs which is the same numbering system used in 2019.  Alignment: Stable since 2019. Chronic grade 1 anterolisthesis of L4 on L5 with mild straightening of lumbar lordosis elsewhere.  Vertebrae: Chronic degenerative endplate marrow edema at L5-S1. Underlying chronic degenerative endplate marrow signal changes there and at L4-L5. Chronic vacuum disc at both of those levels. Intact visible sacrum and SI joints. No other acute osseous abnormality identified.  Conus medullaris and cauda equina: Conus extends to the T12-L1 level. No lower spinal cord or conus signal abnormality.  Paraspinal and other soft tissues: Negative.  Disc levels:  Mild visible lower thoracic spinal stenosis related to disc space loss with disc bulging and posterior element hypertrophy. This appears not significantly changed since 2019, mild at T10-T11 and T12-L1. There is chronic associated bilateral T11 foraminal stenosis which appears moderate.  T12-L1: Negative and evidence of interbody ankylosis via a right lateral flowing endplate osteophyte on series 6, image 4.  L1-L2: Flowing anterior endplate osteophyte although unclear whether there is interbody ankylosis  at this level. There is mild far lateral disc bulging but no significant  stenosis.  L2-L3: Mild circumferential disc bulge and posterior element hypertrophy. Increased epidural lipomatosis and mild spinal stenosis at this level since 2019. Mild left L2 foraminal stenosis appears stable.  L3-L4: Chronic circumferential disc bulge and moderate posterior element hypertrophy. Chronic degenerative facet joint fluid at this level. Increased epidural lipomatosis and mild to moderate spinal stenosis since 2019. Mild to moderate bilateral L3 foraminal stenosis is greater on the left and appears stable.  L4-L5: Chronic severe disc space loss in the setting of grade 1 anterolisthesis. Bulky circumferential disc osteophyte complex and facet hypertrophy. Chronic moderate to severe spinal and bilateral lateral recess stenosis. Chronic mild to moderate left and moderate to severe right L4 foraminal stenosis. This level appears stable.  L5-S1: Chronic severe disc degeneration with vacuum disc, disc space loss and bulky circumferential disc osteophyte complex. Mild to moderate facet hypertrophy. No significant spinal stenosis but moderate to severe chronic lateral recess stenosis greater on the left (S1 nerve levels). Moderate to severe left greater than right L5 foraminal stenosis. This level is stable.  IMPRESSION: 1. Epidural lipomatosis contributing to increased mild to moderate spinal stenosis since 2019 at L2-L3 and L3-L4. 2. Chronic severe disc and endplate degeneration at L4-L5 and L5-S1 appears stable along with: -L4-L5 grade 1 anterolisthesis, moderate to severe spinal, lateral recess, and right foraminal stenosis. -L5-S1 left greater than right moderate to severe lateral recess and bilateral foraminal stenosis. Chronic 3. Mild lower thoracic spinal stenosis at T10-T11 and T11-T12 appears stable. Evidence of adjacent T12-L1 interbody ankylosis.   Electronically Signed   By: Genevie Ann M.D.   On: 02/04/2019 20:54     Objective:  VS:  HT:    WT:    BMI:     BP:(!) 154/68  HR:66bpm  TEMP: ( )  RESP:  Physical Exam   Imaging: No results found.

## 2020-02-10 ENCOUNTER — Other Ambulatory Visit (HOSPITAL_COMMUNITY): Payer: 59

## 2020-02-10 ENCOUNTER — Encounter (HOSPITAL_COMMUNITY): Payer: 59

## 2020-02-10 ENCOUNTER — Encounter: Payer: 59 | Admitting: Vascular Surgery

## 2020-03-03 ENCOUNTER — Telehealth: Payer: Self-pay

## 2020-03-03 NOTE — Telephone Encounter (Signed)
Patient called triage phone. She is scheduled for an appointment on Monday for swelling in her foot. She wants to know what to do in the meantime. I recommended elevation, ice if that helps, and a compression ACE bandage.

## 2020-03-06 ENCOUNTER — Ambulatory Visit (INDEPENDENT_AMBULATORY_CARE_PROVIDER_SITE_OTHER): Payer: 59 | Admitting: Physician Assistant

## 2020-03-06 ENCOUNTER — Encounter: Payer: Self-pay | Admitting: Physician Assistant

## 2020-03-06 ENCOUNTER — Ambulatory Visit (INDEPENDENT_AMBULATORY_CARE_PROVIDER_SITE_OTHER): Payer: 59

## 2020-03-06 DIAGNOSIS — M79672 Pain in left foot: Secondary | ICD-10-CM

## 2020-03-06 NOTE — Addendum Note (Signed)
Addended by: Pamella Pert on: 03/06/2020 01:50 PM   Modules accepted: Orders

## 2020-03-06 NOTE — Progress Notes (Signed)
Office Visit Note   Patient: Chelsey Lee           Date of Birth: 1955-06-28           MRN: [id number] Visit Date: 03/06/2020              Requested by: Willey Blade, [address] [place],  [place] [address] PCP: Willey Blade, MD  Chief Complaint  Patient presents with  . Left Foot - Pain      HPI: Patient is a pleasant 65 year old woman who states that last Thursday she began having pain and swelling in her right midfoot.  She denies any trauma or change in activity.  She denies any history of gout.  She has been elevating and is since gotten somewhat better.  She points to the dorsum of her foot to the lateral side of the foot as the area of most of her pain.  Assessment & Plan: Visit Diagnoses:  1. Pain in left foot     Plan: We will immobilize her in a postop shoe for a couple weeks see if we can improve we talked about Voltaren gel.  Also would like to get a uric acid.  Follow-Up Instructions: No follow-ups on file.   Ortho Exam  Patient is alert, oriented, no adenopathy, well-dressed, normal affect, normal respiratory effort. Examination of her right foot she has an easily palpable dorsalis pedis pulse.  She has no tenderness over the plantar fascia or at the insertion of the plantar fascia.  No redness no cellulitis mild soft tissue swelling.  She does have some tenderness across the dorsum of the right foot.  Imaging: XR Foot 2 Views Left  Result Date: 03/06/2020 2 views of the right foot today do not demonstrate any acute osseous changes.  She does have significant midfoot degenerative changes.  Also a large calcaneal spur and calcification of the plantar fascia as it moves into that area.  Also vessel calcifications going out to her forefoot  No images are attached to the encounter.  Labs: No results found for: HGBA1C, ESRSEDRATE, CRP, LABURIC, REPTSTATUS, GRAMSTAIN, CULT, LABORGA   Lab Results  Component Value Date   ALBUMIN  3.1 (L) 07/20/2015    No results found for: MG No results found for: VD25OH  No results found for: PREALBUMIN CBC EXTENDED Latest Ref Rng & Units 07/20/2015 08/01/2011 07/28/2007  WBC 4.0 - 10.5 K/uL 5.2 - -  RBC 3.87 - 5.11 MIL/uL 2.92(L) - -  HGB 12.0 - 15.0 g/dL 8.3(L) 10.6(L) 12.6  HCT 36.0 - 46.0 % 25.9(L) - 37.0  PLT 150 - 400 K/uL 134(L) - -  NEUTROABS 1.7 - 7.7 K/uL 3.0 - -  LYMPHSABS 0.7 - 4.0 K/uL 1.5 - -     There is no height or weight on file to calculate BMI.  Orders:  Orders Placed This Encounter  Procedures  . XR Foot 2 Views Left   No orders of the defined types were placed in this encounter.    Procedures: No procedures performed  Clinical Data: No additional findings.  ROS:  All other systems negative, except as noted in the HPI. Review of Systems  Objective: Vital Signs: There were no vitals taken for this visit.  Specialty Comments:  No specialty comments available.  PMFS History: Patient Active Problem List   Diagnosis Date Noted  . Allergic rhinitis 01/25/2020  . 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  . Colon cancer screening 01/25/2020  . Gastroesophageal  reflux disease without esophagitis 01/25/2020  . Hyperlipidemia 01/25/2020  . Morbid obesity ([place]) 01/25/2020  . Personal history of colonic polyps 01/25/2020  . Sleep apnea 01/25/2020  . Vitamin D deficiency 01/25/2020  . Controlled type 2 diabetes mellitus without complication ([place]) 59/56/3875  . Epidural lipomatosis 03/02/2019  . Spondylolisthesis of lumbar region 03/02/2019  . Spinal stenosis of lumbar region with neurogenic claudication 03/02/2019  . Left wrist tendinitis 09/07/2018  . Localized osteoarthritis of left knee 04/03/2018  . Abnormal thyroid function test 04/01/2016  . Pain in thumb joint with movement of right hand 09/14/2015   Past Medical History:  Diagnosis Date  . Arthritis   . Diabetes mellitus   . GERD (gastroesophageal reflux disease)   .  Hyperlipemia   . Hypertension   . Seasonal allergies     No family history on file.  Past Surgical History:  Procedure Laterality Date  . CAPSULOTOMY  01/13/2012   Procedure: MINOR CAPSULOTOMY;  Surgeon: Myrtha Mantis., MD;  Location: [facility];  Service: Ophthalmology;  Laterality: Left;  . DORSAL COMPARTMENT RELEASE  2009   rt   . KNEE ARTHROSCOPY  02,04   right and left  . STERIOD INJECTION  08/01/2011   Procedure: STEROID INJECTION;  Surgeon: Cammie Sickle., MD;  Location: [facility];  Service: Orthopedics;  Laterality: Left;  cmc left thumb  . TRIGGER FINGER RELEASE  2006   rt thumb  . TRIGGER FINGER RELEASE  08/01/2011   Procedure: RELEASE TRIGGER FINGER/A-1 PULLEY;  Surgeon: Cammie Sickle., MD;  Location: [facility];  Service: Orthopedics;  Laterality: Left;  left thumb  . YAG LASER APPLICATION  64/33/2951   Procedure: YAG LASER APPLICATION;  Surgeon: Myrtha Mantis., MD;  Location: [facility];  Service: Ophthalmology;  Laterality: Left;   Social History   Occupational History  . Not on file  Tobacco Use  . Smoking status: Never Smoker  . Smokeless tobacco: Never Used  Substance and Sexual Activity  . Alcohol use: No  . Drug use: No  . Sexual activity: Not on file

## 2020-03-07 LAB — URIC ACID: Uric Acid, Serum: 8.9 mg/dL — ABNORMAL HIGH (ref 2.5–7.0)

## 2020-03-08 ENCOUNTER — Other Ambulatory Visit: Payer: Self-pay | Admitting: Physician Assistant

## 2020-03-08 MED ORDER — COLCHICINE 0.6 MG PO CAPS: 0.3000 mg | ORAL_CAPSULE | Freq: Two times a day (BID) | ORAL | 1 refills | Status: DC | PRN

## 2020-03-08 MED ORDER — ALLOPURINOL 100 MG PO TABS: 100.0000 mg | ORAL_TABLET | Freq: Every day | ORAL | 3 refills | Status: DC

## 2020-03-09 ENCOUNTER — Other Ambulatory Visit: Payer: Self-pay | Admitting: Internal Medicine

## 2020-03-09 DIAGNOSIS — Z1231 Encounter for screening mammogram for malignant neoplasm of breast: Secondary | ICD-10-CM

## 2020-03-09 DIAGNOSIS — M109 Gout, unspecified: Secondary | ICD-10-CM | POA: Insufficient documentation

## 2020-03-14 ENCOUNTER — Ambulatory Visit
Admission: RE | Admit: 2020-03-14 | Discharge: 2020-03-14 | Disposition: A | Discharge status: Home / Self Care | Payer: 59 | Source: Ambulatory Visit | Attending: Internal Medicine | Admitting: Internal Medicine

## 2020-03-14 ENCOUNTER — Other Ambulatory Visit: Payer: Self-pay

## 2020-03-14 DIAGNOSIS — Z1231 Encounter for screening mammogram for malignant neoplasm of breast: Secondary | ICD-10-CM

## 2020-03-14 MED ORDER — TRIAMCINOLONE ACETONIDE 40 MG/ML IJ SUSP
60.0000 mg | INTRAMUSCULAR | Status: AC | PRN
  Administered 2020-02-02: 60 mg via INTRA_ARTICULAR

## 2020-03-14 MED ORDER — BUPIVACAINE HCL 0.25 % IJ SOLN
4.0000 mL | INTRAMUSCULAR | Status: AC | PRN
  Administered 2020-02-02: 4 mL via INTRA_ARTICULAR

## 2020-03-16 ENCOUNTER — Other Ambulatory Visit: Payer: Self-pay

## 2020-03-16 ENCOUNTER — Ambulatory Visit (INDEPENDENT_AMBULATORY_CARE_PROVIDER_SITE_OTHER)
Admission: RE | Admit: 2020-03-16 | Discharge: 2020-03-16 | Disposition: A | Discharge status: Home / Self Care | Payer: 59 | Source: Ambulatory Visit | Attending: Vascular Surgery | Admitting: Vascular Surgery

## 2020-03-16 ENCOUNTER — Ambulatory Visit (HOSPITAL_COMMUNITY)
Admission: RE | Admit: 2020-03-16 | Discharge: 2020-03-16 | Disposition: A | Discharge status: Home / Self Care | Payer: 59 | Source: Ambulatory Visit | Attending: Vascular Surgery | Admitting: Vascular Surgery

## 2020-03-16 ENCOUNTER — Ambulatory Visit (INDEPENDENT_AMBULATORY_CARE_PROVIDER_SITE_OTHER): Payer: 59 | Admitting: Vascular Surgery

## 2020-03-16 ENCOUNTER — Encounter: Payer: Self-pay | Admitting: Vascular Surgery

## 2020-03-16 VITALS — BP 160/72 | HR 74 | Temp 98.4°F | Resp 20 | Ht 66.0 in | Wt 282.0 lb

## 2020-03-16 DIAGNOSIS — N189 Chronic kidney disease, unspecified: Secondary | ICD-10-CM | POA: Insufficient documentation

## 2020-03-16 DIAGNOSIS — N184 Chronic kidney disease, stage 4 (severe): Secondary | ICD-10-CM

## 2020-03-16 NOTE — Progress Notes (Signed)
Referring Physician: Dr Carolin Sicks  Patient name: Chelsey Lee MRN: [id number] DOB: 1955-10-09 Sex: female  REASON FOR CONSULT: Hemodialysis access HPI: Chelsey Lee is a 65 y.o. female, sent for placement of a long-term hemodialysis access.  The patient is CKD4.  She is right-handed.  She has not had any prior access procedures.  Renal failure is thought to be secondary to hypertension and diabetes.  Other medical problems include arthritis hyperlipidemia both of which are currently stable.  Past Medical History:  Diagnosis Date  . Arthritis   . Diabetes mellitus   . GERD (gastroesophageal reflux disease)   . Hyperlipemia   . Hypertension   . Seasonal allergies    Past Surgical History:  Procedure Laterality Date  . CAPSULOTOMY  01/13/2012   Procedure: MINOR CAPSULOTOMY;  Surgeon: Myrtha Mantis., MD;  Location: [facility];  Service: Ophthalmology;  Laterality: Left;  . DORSAL COMPARTMENT RELEASE  2009   rt   . KNEE ARTHROSCOPY  02,04   right and left  . STERIOD INJECTION  08/01/2011   Procedure: STEROID INJECTION;  Surgeon: Cammie Sickle., MD;  Location: [facility];  Service: Orthopedics;  Laterality: Left;  cmc left thumb  . TRIGGER FINGER RELEASE  2006   rt thumb  . TRIGGER FINGER RELEASE  08/01/2011   Procedure: RELEASE TRIGGER FINGER/A-1 PULLEY;  Surgeon: Cammie Sickle., MD;  Location: [facility];  Service: Orthopedics;  Laterality: Left;  left thumb  . YAG LASER APPLICATION  31/51/7616   Procedure: YAG LASER APPLICATION;  Surgeon: Myrtha Mantis., MD;  Location: [facility];  Service: Ophthalmology;  Laterality: Left;    History reviewed. No pertinent family history.  SOCIAL HISTORY: Social History   Socioeconomic History  . Marital status: Single    Spouse name: Not on file  . Number of children: Not on file  . Years of education: Not on file  . Highest education level: Not on file  Occupational History  . Not  on file  Tobacco Use  . Smoking status: Never Smoker  . Smokeless tobacco: Never Used  Vaping Use  . Vaping Use: Never used  Substance and Sexual Activity  . Alcohol use: No  . Drug use: No  . Sexual activity: Not on file  Other Topics Concern  . Not on file  Social History Narrative  . Not on file   Social Determinants of Health   Financial Resource Strain: Not on file  Food Insecurity: Not on file  Transportation Needs: Not on file  Physical Activity: Not on file  Stress: Not on file  Social Connections: Not on file  Intimate Partner Violence: Not on file    No Known Allergies  Current Outpatient Medications  Medication Sig Dispense Refill  . allopurinol (ZYLOPRIM) 100 MG tablet Take 1 tablet (100 mg total) by mouth daily. 60 tablet 3  . atorvastatin (LIPITOR) 20 MG tablet Take 20 mg by mouth daily.    . carvedilol (COREG) 25 MG tablet Take 25 mg by mouth 2 (two) times daily with a meal.    . escitalopram (LEXAPRO) 10 MG tablet Take 10 mg by mouth daily.    . fenofibrate 160 MG tablet Take 160 mg by mouth daily.    . furosemide (LASIX) 80 MG tablet Take 80 mg by mouth daily.     . insulin NPH-regular Human (70-30) 100 UNIT/ML injection Inject 45 Units into the skin daily with breakfast.    .  insulin NPH-regular Human (70-30) 100 UNIT/ML injection Inject 35 Units into the skin daily with supper.    . liraglutide (VICTOZA) 18 MG/3ML SOPN Inject 1.2 mg into the skin.     No current facility-administered medications for this visit.    ROS:   General:  No weight loss, Fever, chills  HEENT: No recent headaches, no nasal bleeding, no visual changes, no sore throat  Neurologic: No dizziness, blackouts, seizures. No recent symptoms of stroke or mini- stroke. No recent episodes of slurred speech, or temporary blindness.  Cardiac: No recent episodes of chest pain/pressure, no shortness of breath at rest.  No shortness of breath with exertion.  Denies history of atrial  fibrillation or irregular heartbeat  Vascular: No history of rest pain in feet.  No history of claudication.  No history of non-healing ulcer, No history of DVT   Pulmonary: No home oxygen, no productive cough, no hemoptysis,  No asthma or wheezing  Musculoskeletal:  [X]  Arthritis, [ ]  Low back pain,  [X]  Joint pain  Hematologic:No history of hypercoagulable state.  No history of easy bleeding.  No history of anemia  Gastrointestinal: No hematochezia or melena,  No gastroesophageal reflux, no trouble swallowing  Urinary: [X]  chronic Kidney disease, [ ]  on HD - [ ]  MWF or [ ]  TTHS, [ ]  Burning with urination, [ ]  Frequent urination, [ ]  Difficulty urinating;   Skin: No rashes  Psychological: No history of anxiety,  No history of depression   Physical Examination  Vitals:   03/16/20 0934  BP: (!) 160/72  Pulse: 74  Resp: 20  Temp: 98.4 F (36.9 C)  SpO2: 98%  Weight: 282 lb (127.9 kg)  Height: 5\' 6"  (1.676 m)    Body mass index is 45.52 kg/m.  General:  Alert and oriented, no acute distress HEENT: Normal Neck: No JVD Cardiac: Regular Rate and Rhythm Abdomen: Obese Skin: No rash Extremity Pulses:  2+ radial, brachial pulses bilaterally Musculoskeletal: No deformity or edema  Neurologic: Upper and lower extremity motor 5/5 and symmetric  DATA:  Patient had an upper extremity arterial duplex and bilateral upper extremity vein mapping today.  Upper extremity arterial duplex shows a 3 mm radial artery 5 mm brachial artery bilaterally.  There was normal anatomic configuration.  She had a 4-5 4 5  to 6 mm upper arm cephalic bilaterally.  Basilic vein was 3 to 4 mm bilaterally.  Cephalic vein was 2 to 0-7/3 mm at the wrist bilaterally.  ASSESSMENT: Patient needs long-term hemodialysis access.  She is currently CKD 4.  She has a very good quality upper arm cephalic vein bilaterally.  However she has fairly obese upper arms and most likely a fistula in this location would require  Superficialization.  The cephalic vein in her wrist bilaterally is small and the 2 mm range but may be worthwhile to try to create a fistula in this area as it would be more easy to cannulate due to her obesity.  I discussed placing a left radiocephalic AV fistula with the patient today.  Risk benefits possible complications of procedure details including but not limited to bleeding infection nonmaturation of the fistula ischemic steal numbness and tingling on the dorsum of the hand were all discussed with the patient today.  I also discussed the possibility of having to place a left brachiocephalic fistula in the upper arm if this did not mature.  She wishes to think about whether or not she wants to undergo the procedure at this  point since she is not currently on dialysis.  I did discuss with her that the reason to place the fistula at this point is so that it is mature at the time that is needed.  She will call us if she wishes to have a fistula placed in the near future.   PLAN: See above   Ruta Hinds, MD [facility] Office: [phone]

## 2020-03-16 NOTE — H&P (View-Only) (Signed)
Referring Physician: Dr Carolin Sicks  Patient name: Chelsey Lee MRN: [id number] DOB: [birth year] Sex: female  REASON FOR CONSULT: Hemodialysis access HPI: Chelsey Lee is a 65 y.o. female, sent for placement of a long-term hemodialysis access.  The patient is CKD4.  She is right-handed.  She has not had any prior access procedures.  Renal failure is thought to be secondary to hypertension and diabetes.  Other medical problems include arthritis hyperlipidemia both of which are currently stable.  Past Medical History:  Diagnosis Date  . Arthritis   . Diabetes mellitus   . GERD (gastroesophageal reflux disease)   . Hyperlipemia   . Hypertension   . Seasonal allergies    Past Surgical History:  Procedure Laterality Date  . CAPSULOTOMY  01/13/2012   Procedure: MINOR CAPSULOTOMY;  Surgeon: Myrtha Mantis., MD;  Location: [facility];  Service: Ophthalmology;  Laterality: Left;  . DORSAL COMPARTMENT RELEASE  2009   rt   . KNEE ARTHROSCOPY  02,04   right and left  . STERIOD INJECTION  08/01/2011   Procedure: STEROID INJECTION;  Surgeon: Cammie Sickle., MD;  Location: [facility];  Service: Orthopedics;  Laterality: Left;  cmc left thumb  . TRIGGER FINGER RELEASE  2006   rt thumb  . TRIGGER FINGER RELEASE  08/01/2011   Procedure: RELEASE TRIGGER FINGER/A-1 PULLEY;  Surgeon: Cammie Sickle., MD;  Location: [facility];  Service: Orthopedics;  Laterality: Left;  left thumb  . YAG LASER APPLICATION  12/45/8099   Procedure: YAG LASER APPLICATION;  Surgeon: Myrtha Mantis., MD;  Location: [facility];  Service: Ophthalmology;  Laterality: Left;    History reviewed. No pertinent family history.  SOCIAL HISTORY: Social History   Socioeconomic History  . Marital status: Single    Spouse name: Not on file  . Number of children: Not on file  . Years of education: Not on file  . Highest education level: Not on file  Occupational History  . Not  on file  Tobacco Use  . Smoking status: Never Smoker  . Smokeless tobacco: Never Used  Vaping Use  . Vaping Use: Never used  Substance and Sexual Activity  . Alcohol use: No  . Drug use: No  . Sexual activity: Not on file  Other Topics Concern  . Not on file  Social History Narrative  . Not on file   Social Determinants of Health   Financial Resource Strain: Not on file  Food Insecurity: Not on file  Transportation Needs: Not on file  Physical Activity: Not on file  Stress: Not on file  Social Connections: Not on file  Intimate Partner Violence: Not on file    No Known Allergies  Current Outpatient Medications  Medication Sig Dispense Refill  . allopurinol (ZYLOPRIM) 100 MG tablet Take 1 tablet (100 mg total) by mouth daily. 60 tablet 3  . atorvastatin (LIPITOR) 20 MG tablet Take 20 mg by mouth daily.    . carvedilol (COREG) 25 MG tablet Take 25 mg by mouth 2 (two) times daily with a meal.    . escitalopram (LEXAPRO) 10 MG tablet Take 10 mg by mouth daily.    . fenofibrate 160 MG tablet Take 160 mg by mouth daily.    . furosemide (LASIX) 80 MG tablet Take 80 mg by mouth daily.     . insulin NPH-regular Human (70-30) 100 UNIT/ML injection Inject 45 Units into the skin daily with breakfast.    .  insulin NPH-regular Human (70-30) 100 UNIT/ML injection Inject 35 Units into the skin daily with supper.    . liraglutide (VICTOZA) 18 MG/3ML SOPN Inject 1.2 mg into the skin.     No current facility-administered medications for this visit.    ROS:   General:  No weight loss, Fever, chills  HEENT: No recent headaches, no nasal bleeding, no visual changes, no sore throat  Neurologic: No dizziness, blackouts, seizures. No recent symptoms of stroke or mini- stroke. No recent episodes of slurred speech, or temporary blindness.  Cardiac: No recent episodes of chest pain/pressure, no shortness of breath at rest.  No shortness of breath with exertion.  Denies history of atrial  fibrillation or irregular heartbeat  Vascular: No history of rest pain in feet.  No history of claudication.  No history of non-healing ulcer, No history of DVT   Pulmonary: No home oxygen, no productive cough, no hemoptysis,  No asthma or wheezing  Musculoskeletal:  [X]  Arthritis, [ ]  Low back pain,  [X]  Joint pain  Hematologic:No history of hypercoagulable state.  No history of easy bleeding.  No history of anemia  Gastrointestinal: No hematochezia or melena,  No gastroesophageal reflux, no trouble swallowing  Urinary: [X]  chronic Kidney disease, [ ]  on HD - [ ]  MWF or [ ]  TTHS, [ ]  Burning with urination, [ ]  Frequent urination, [ ]  Difficulty urinating;   Skin: No rashes  Psychological: No history of anxiety,  No history of depression   Physical Examination  Vitals:   03/16/20 0934  BP: (!) 160/72  Pulse: 74  Resp: 20  Temp: 98.4 F (36.9 C)  SpO2: 98%  Weight: 282 lb (127.9 kg)  Height: 5\' 6"  (1.676 m)    Body mass index is 45.52 kg/m.  General:  Alert and oriented, no acute distress HEENT: Normal Neck: No JVD Cardiac: Regular Rate and Rhythm Abdomen: Obese Skin: No rash Extremity Pulses:  2+ radial, brachial pulses bilaterally Musculoskeletal: No deformity or edema  Neurologic: Upper and lower extremity motor 5/5 and symmetric  DATA:  Patient had an upper extremity arterial duplex and bilateral upper extremity vein mapping today.  Upper extremity arterial duplex shows a 3 mm radial artery 5 mm brachial artery bilaterally.  There was normal anatomic configuration.  She had a 4-5 4 5  to 6 mm upper arm cephalic bilaterally.  Basilic vein was 3 to 4 mm bilaterally.  Cephalic vein was 2 to 1-5/1 mm at the wrist bilaterally.  ASSESSMENT: Patient needs long-term hemodialysis access.  She is currently CKD 4.  She has a very good quality upper arm cephalic vein bilaterally.  However she has fairly obese upper arms and most likely a fistula in this location would require  Superficialization.  The cephalic vein in her wrist bilaterally is small and the 2 mm range but may be worthwhile to try to create a fistula in this area as it would be more easy to cannulate due to her obesity.  I discussed placing a left radiocephalic AV fistula with the patient today.  Risk benefits possible complications of procedure details including but not limited to bleeding infection nonmaturation of the fistula ischemic steal numbness and tingling on the dorsum of the hand were all discussed with the patient today.  I also discussed the possibility of having to place a left brachiocephalic fistula in the upper arm if this did not mature.  She wishes to think about whether or not she wants to undergo the procedure at this  point since she is not currently on dialysis.  I did discuss with her that the reason to place the fistula at this point is so that it is mature at the time that is needed.  She will call us if she wishes to have a fistula placed in the near future.   PLAN: See above   Ruta Hinds, MD [facility] Office: [phone]

## 2020-03-17 ENCOUNTER — Telehealth: Payer: Self-pay

## 2020-03-17 NOTE — Telephone Encounter (Signed)
Pt's sister called who stated she is pt's POA. Pt's sister is going to have pt call surgery scheduler next week to schedule procedure.

## 2020-03-17 NOTE — Telephone Encounter (Signed)
Returned pt's call; left message for her to call us back if she still needs assistance.

## 2020-03-20 ENCOUNTER — Ambulatory Visit: Payer: 59 | Admitting: Orthopedic Surgery

## 2020-03-21 ENCOUNTER — Other Ambulatory Visit: Payer: Self-pay | Admitting: Internal Medicine

## 2020-03-21 DIAGNOSIS — R928 Other abnormal and inconclusive findings on diagnostic imaging of breast: Secondary | ICD-10-CM

## 2020-03-22 ENCOUNTER — Other Ambulatory Visit: Payer: Self-pay

## 2020-04-05 ENCOUNTER — Other Ambulatory Visit: Payer: Self-pay | Admitting: Internal Medicine

## 2020-04-05 ENCOUNTER — Other Ambulatory Visit: Payer: Self-pay

## 2020-04-05 ENCOUNTER — Ambulatory Visit
Admission: RE | Admit: 2020-04-05 | Discharge: 2020-04-05 | Disposition: A | Discharge status: Home / Self Care | Payer: 59 | Source: Ambulatory Visit | Attending: Internal Medicine | Admitting: Internal Medicine

## 2020-04-05 DIAGNOSIS — R928 Other abnormal and inconclusive findings on diagnostic imaging of breast: Secondary | ICD-10-CM

## 2020-04-07 ENCOUNTER — Encounter (HOSPITAL_COMMUNITY): Payer: Self-pay | Admitting: Vascular Surgery

## 2020-04-07 MED ORDER — DEXTROSE 5 % IV SOLN
3.0000 g | INTRAVENOUS | Status: AC
  Administered 2020-04-10: 3 g via INTRAVENOUS
  Filled 2020-04-07: qty 3000
  Filled 2020-04-07: qty 4.29

## 2020-04-07 NOTE — Progress Notes (Signed)
Unable to reach patient via phone.  Left detailed message on machine with instructions for day of surgery.  PCP - Dr Willey Blade Cardiologist - n/a  Chest x-ray - n/a EKG - DOS 04/10/20 Stress Test - n/a ECHO - 03/24/14 Cardiac Cath - n/a  Do not take Victoza or Human 70/30 Insulin on the morning of surgery.  . THE NIGHT BEFORE SURGERY, take 21 units of Human 70/30 Insulin     . If your blood sugar is less than 70 mg/dL, you will need to treat for low blood sugar: o Treat a low blood sugar (less than 70 mg/dL) with  cup of clear juice (cranberry or apple), 4 glucose tablets, OR glucose gel. o Recheck blood sugar in 15 minutes after treatment (to make sure it is greater than 70 mg/dL). If your blood sugar is not greater than 70 mg/dL on recheck, call [phone] for further instructions.  Anesthesia review: Yes  STOP now taking any Aspirin (unless otherwise instructed by your surgeon), Aleve, Naproxen, Ibuprofen, Motrin, Advil, Goody's, BC's, all herbal medications, fish oil, and all vitamins.   Coronavirus Screening Covid test is scheduled on Sat 04/08/20

## 2020-04-08 ENCOUNTER — Other Ambulatory Visit (HOSPITAL_COMMUNITY)
Admission: RE | Admit: 2020-04-08 | Discharge: 2020-04-08 | Disposition: A | Discharge status: Home / Self Care | Payer: 59 | Source: Ambulatory Visit | Attending: Vascular Surgery | Admitting: Vascular Surgery

## 2020-04-08 DIAGNOSIS — Z01812 Encounter for preprocedural laboratory examination: Secondary | ICD-10-CM | POA: Diagnosis present

## 2020-04-08 DIAGNOSIS — Z20822 Contact with and (suspected) exposure to covid-19: Secondary | ICD-10-CM | POA: Diagnosis not present

## 2020-04-08 LAB — SARS CORONAVIRUS 2 (TAT 6-24 HRS): SARS Coronavirus 2: NEGATIVE

## 2020-04-10 ENCOUNTER — Ambulatory Visit (HOSPITAL_COMMUNITY)
Admission: RE | Admit: 2020-04-10 | Discharge: 2020-04-10 | Disposition: A | Discharge status: Home / Self Care | Payer: 59 | Source: Ambulatory Visit | Attending: Vascular Surgery | Admitting: Vascular Surgery

## 2020-04-10 ENCOUNTER — Encounter (HOSPITAL_COMMUNITY)
Admission: RE | Disposition: A | Discharge status: Home / Self Care | Payer: Self-pay | Source: Ambulatory Visit | Attending: Vascular Surgery

## 2020-04-10 ENCOUNTER — Encounter (HOSPITAL_COMMUNITY): Payer: Self-pay | Admitting: Vascular Surgery

## 2020-04-10 ENCOUNTER — Ambulatory Visit (HOSPITAL_COMMUNITY): Payer: 59 | Admitting: Emergency Medicine

## 2020-04-10 ENCOUNTER — Other Ambulatory Visit: Payer: Self-pay

## 2020-04-10 DIAGNOSIS — Z79899 Other long term (current) drug therapy: Secondary | ICD-10-CM | POA: Diagnosis not present

## 2020-04-10 DIAGNOSIS — E785 Hyperlipidemia, unspecified: Secondary | ICD-10-CM | POA: Diagnosis not present

## 2020-04-10 DIAGNOSIS — N184 Chronic kidney disease, stage 4 (severe): Secondary | ICD-10-CM

## 2020-04-10 DIAGNOSIS — E1122 Type 2 diabetes mellitus with diabetic chronic kidney disease: Secondary | ICD-10-CM | POA: Diagnosis not present

## 2020-04-10 DIAGNOSIS — N186 End stage renal disease: Secondary | ICD-10-CM

## 2020-04-10 DIAGNOSIS — Z794 Long term (current) use of insulin: Secondary | ICD-10-CM | POA: Insufficient documentation

## 2020-04-10 DIAGNOSIS — I129 Hypertensive chronic kidney disease with stage 1 through stage 4 chronic kidney disease, or unspecified chronic kidney disease: Secondary | ICD-10-CM | POA: Diagnosis not present

## 2020-04-10 HISTORY — PX: AV FISTULA PLACEMENT: SHX1204

## 2020-04-10 HISTORY — DX: Chronic kidney disease, unspecified: N18.9

## 2020-04-10 LAB — POCT I-STAT, CHEM 8
BUN: 55 mg/dL — ABNORMAL HIGH (ref 8–23)
Calcium, Ion: 1.22 mmol/L (ref 1.15–1.40)
Chloride: 104 mmol/L (ref 98–111)
Creatinine, Ser: 3.9 mg/dL — ABNORMAL HIGH (ref 0.44–1.00)
Glucose, Bld: 72 mg/dL (ref 70–99)
HCT: 32 % — ABNORMAL LOW (ref 36.0–46.0)
Hemoglobin: 10.9 g/dL — ABNORMAL LOW (ref 12.0–15.0)
Potassium: 4.4 mmol/L (ref 3.5–5.1)
Sodium: 141 mmol/L (ref 135–145)
TCO2: 30 mmol/L (ref 22–32)

## 2020-04-10 LAB — GLUCOSE, CAPILLARY
Glucose-Capillary: 68 mg/dL — ABNORMAL LOW (ref 70–99)
Glucose-Capillary: 91 mg/dL (ref 70–99)
Glucose-Capillary: 86 mg/dL (ref 70–99)
Glucose-Capillary: 73 mg/dL (ref 70–99)

## 2020-04-10 SURGERY — ARTERIOVENOUS (AV) FISTULA CREATION
Anesthesia: Monitor Anesthesia Care | Site: Arm Upper | Laterality: Left

## 2020-04-10 MED ORDER — ONDANSETRON HCL 4 MG/2ML IJ SOLN
INTRAMUSCULAR | Status: AC
  Filled 2020-04-10: qty 2

## 2020-04-10 MED ORDER — ORAL CARE MOUTH RINSE: 15.0000 mL | Freq: Once | OROMUCOSAL | Status: AC

## 2020-04-10 MED ORDER — DEXTROSE 50 % IV SOLN
INTRAVENOUS | Status: AC
  Administered 2020-04-10: 50 mL
  Filled 2020-04-10: qty 50

## 2020-04-10 MED ORDER — SODIUM CHLORIDE 0.9 % IV SOLN
INTRAVENOUS | Status: AC
  Filled 2020-04-10: qty 1.2

## 2020-04-10 MED ORDER — MIDAZOLAM HCL 2 MG/2ML IJ SOLN
INTRAMUSCULAR | Status: AC
  Filled 2020-04-10: qty 2

## 2020-04-10 MED ORDER — SODIUM CHLORIDE 0.9 % IV SOLN
INTRAVENOUS | Status: DC | PRN
  Administered 2020-04-10: 500 mL

## 2020-04-10 MED ORDER — CHLORHEXIDINE GLUCONATE 0.12 % MT SOLN
15.0000 mL | Freq: Once | OROMUCOSAL | Status: AC
  Administered 2020-04-10: 15 mL via OROMUCOSAL
  Filled 2020-04-10: qty 15

## 2020-04-10 MED ORDER — CHLORHEXIDINE GLUCONATE 4 % EX LIQD: 60.0000 mL | Freq: Once | CUTANEOUS | Status: DC

## 2020-04-10 MED ORDER — 0.9 % SODIUM CHLORIDE (POUR BTL) OPTIME
TOPICAL | Status: DC | PRN
  Administered 2020-04-10: 1000 mL

## 2020-04-10 MED ORDER — OXYCODONE-ACETAMINOPHEN 5-325 MG PO TABS: 1.0000 | ORAL_TABLET | ORAL | 0 refills | Status: DC | PRN

## 2020-04-10 MED ORDER — ONDANSETRON HCL 4 MG/2ML IJ SOLN
INTRAMUSCULAR | Status: DC | PRN
  Administered 2020-04-10: 4 mg via INTRAVENOUS

## 2020-04-10 MED ORDER — SODIUM CHLORIDE 0.9 % IV SOLN: INTRAVENOUS | Status: DC

## 2020-04-10 MED ORDER — FENTANYL CITRATE (PF) 250 MCG/5ML IJ SOLN
INTRAMUSCULAR | Status: AC
  Filled 2020-04-10: qty 5

## 2020-04-10 MED ORDER — FENTANYL CITRATE (PF) 100 MCG/2ML IJ SOLN
INTRAMUSCULAR | Status: DC | PRN
  Administered 2020-04-10: 50 ug via INTRAVENOUS

## 2020-04-10 MED ORDER — LIDOCAINE 2% (20 MG/ML) 5 ML SYRINGE
INTRAMUSCULAR | Status: AC
  Filled 2020-04-10: qty 5

## 2020-04-10 MED ORDER — LIDOCAINE HCL (PF) 1 % IJ SOLN
INTRAMUSCULAR | Status: AC
  Filled 2020-04-10: qty 30

## 2020-04-10 MED ORDER — LIDOCAINE HCL (CARDIAC) PF 100 MG/5ML IV SOSY
PREFILLED_SYRINGE | INTRAVENOUS | Status: DC | PRN
  Administered 2020-04-10: 40 mg via INTRATRACHEAL

## 2020-04-10 MED ORDER — MIDAZOLAM HCL 5 MG/5ML IJ SOLN
INTRAMUSCULAR | Status: DC | PRN
  Administered 2020-04-10: 2 mg via INTRAVENOUS

## 2020-04-10 MED ORDER — CARVEDILOL 12.5 MG PO TABS
ORAL_TABLET | ORAL | Status: AC
  Administered 2020-04-10: 25 mg
  Filled 2020-04-10: qty 2

## 2020-04-10 MED ORDER — LIDOCAINE HCL (PF) 1 % IJ SOLN
INTRAMUSCULAR | Status: DC | PRN
  Administered 2020-04-10: 12 mL

## 2020-04-10 MED ORDER — PROPOFOL 500 MG/50ML IV EMUL
INTRAVENOUS | Status: DC | PRN
  Administered 2020-04-10: 100 ug/kg/min via INTRAVENOUS

## 2020-04-10 MED ORDER — PROTAMINE SULFATE 10 MG/ML IV SOLN
INTRAVENOUS | Status: AC
  Filled 2020-04-10: qty 10

## 2020-04-10 MED ORDER — HEPARIN SODIUM (PORCINE) 1000 UNIT/ML IJ SOLN
INTRAMUSCULAR | Status: AC
  Filled 2020-04-10: qty 1

## 2020-04-10 MED ORDER — CARVEDILOL 12.5 MG PO TABS: 25.0000 mg | ORAL_TABLET | Freq: Once | ORAL | Status: DC

## 2020-04-10 MED ORDER — DEXTROSE 50 % IV SOLN: 25.0000 mL | Freq: Once | INTRAVENOUS | Status: DC

## 2020-04-10 MED ORDER — HEPARIN SODIUM (PORCINE) 1000 UNIT/ML IJ SOLN
INTRAMUSCULAR | Status: DC | PRN
  Administered 2020-04-10: 8000 [IU] via INTRAVENOUS

## 2020-04-10 SURGICAL SUPPLY — 34 items
ADH SKN CLS APL DERMABOND .7 (GAUZE/BANDAGES/DRESSINGS) ×1
ARMBAND PINK RESTRICT EXTREMIT (MISCELLANEOUS) ×4 IMPLANT
CANISTER SUCT 3000ML PPV (MISCELLANEOUS) ×2 IMPLANT
CANNULA VESSEL 3MM 2 BLNT TIP (CANNULA) ×2 IMPLANT
CLIP VESOCCLUDE MED 6/CT (CLIP) ×2 IMPLANT
CLIP VESOCCLUDE SM WIDE 6/CT (CLIP) ×2 IMPLANT
COVER PROBE W GEL 5X96 (DRAPES) IMPLANT
COVER WAND RF STERILE (DRAPES) ×1 IMPLANT
DECANTER SPIKE VIAL GLASS SM (MISCELLANEOUS) ×2 IMPLANT
DERMABOND ADVANCED (GAUZE/BANDAGES/DRESSINGS) ×1
DERMABOND ADVANCED .7 DNX12 (GAUZE/BANDAGES/DRESSINGS) ×1 IMPLANT
DRAIN PENROSE 1/4X12 LTX STRL (WOUND CARE) ×2 IMPLANT
ELECT REM PT RETURN 9FT ADLT (ELECTROSURGICAL) ×2
ELECTRODE REM PT RTRN 9FT ADLT (ELECTROSURGICAL) ×1 IMPLANT
GOWN STRL REUS W/ TWL LRG LVL3 (GOWN DISPOSABLE) ×3 IMPLANT
GOWN STRL REUS W/TWL LRG LVL3 (GOWN DISPOSABLE) ×6
KIT BASIN OR (CUSTOM PROCEDURE TRAY) ×2 IMPLANT
KIT TURNOVER KIT B (KITS) ×2 IMPLANT
LOOP VESSEL MINI RED (MISCELLANEOUS) IMPLANT
NS IRRIG 1000ML POUR BTL (IV SOLUTION) ×2 IMPLANT
PACK CV ACCESS (CUSTOM PROCEDURE TRAY) ×2 IMPLANT
PAD ARMBOARD 7.5X6 YLW CONV (MISCELLANEOUS) ×4 IMPLANT
SPONGE SURGIFOAM ABS GEL 100 (HEMOSTASIS) IMPLANT
SUT PROLENE 6 0 CC (SUTURE) ×2 IMPLANT
SUT PROLENE 7 0 BV 1 (SUTURE) IMPLANT
SUT SILK 3 0 (SUTURE) ×2
SUT SILK 3-0 18XBRD TIE 12 (SUTURE) IMPLANT
SUT VIC AB 3-0 SH 27 (SUTURE) ×2
SUT VIC AB 3-0 SH 27X BRD (SUTURE) ×1 IMPLANT
SUT VICRYL 4-0 PS2 18IN ABS (SUTURE) ×2 IMPLANT
TAPE STRIPS DRAPE STRL (GAUZE/BANDAGES/DRESSINGS) ×1 IMPLANT
TOWEL GREEN STERILE (TOWEL DISPOSABLE) ×2 IMPLANT
UNDERPAD 30X36 HEAVY ABSORB (UNDERPADS AND DIAPERS) ×2 IMPLANT
WATER STERILE IRR 1000ML POUR (IV SOLUTION) ×2 IMPLANT

## 2020-04-10 NOTE — Anesthesia Postprocedure Evaluation (Signed)
Anesthesia Post Note  Patient: Chelsey Lee  Procedure(s) Performed: LEFT ARM ARTERIOVENOUS (AV) FISTULA CREATION (Left Arm Upper)     Patient location during evaluation: PACU Anesthesia Type: MAC Level of consciousness: awake and alert Pain management: pain level controlled Vital Signs Assessment: post-procedure vital signs reviewed and stable Respiratory status: spontaneous breathing, nonlabored ventilation, respiratory function stable and patient connected to nasal cannula oxygen Cardiovascular status: stable and blood pressure returned to baseline Postop Assessment: no apparent nausea or vomiting Anesthetic complications: no   No complications documented.  Last Vitals:  Vitals:   04/10/20 1520 04/10/20 1525  BP: (!) 163/69   Pulse: 69 67  Resp: 15 (!) 21  Temp:  (!) 36.2 C  SpO2: 98% 98%    Last Pain:  Vitals:   04/10/20 0954  TempSrc:   PainSc: 0-No pain                 Shelly Shoultz

## 2020-04-10 NOTE — Anesthesia Preprocedure Evaluation (Signed)
Anesthesia Evaluation  Patient identified by MRN, date of birth, ID band Patient awake    Reviewed: Allergy & Precautions, NPO status , Patient's Chart, lab work & pertinent test results, reviewed documented beta blocker date and time   History of Anesthesia Complications Negative for: history of anesthetic complications  Airway Mallampati: I  TM Distance: >3 FB Neck ROM: Full    Dental  (+) Dental Advisory Given   Pulmonary neg shortness of breath, sleep apnea , neg COPD, neg recent URI,  Covid-19 Nucleic Acid Test Results Lab Results      Component                Value               Date                      SARSCOV2NAA              NEGATIVE            04/08/2020              breath sounds clear to auscultation       Cardiovascular hypertension, Pt. on medications and Pt. on home beta blockers (-) angina(-) Past MI and (-) CHF  Rhythm:Regular  Left ventricle: The cavity size was normal. Wall thickness was  increased in a pattern of moderate LVH. Systolic function was  normal. The estimated ejection fraction was in the range of 60%  to 65%. Wall motion was normal; there were no regional wall  motion abnormalities. Doppler parameters are consistent with  abnormal left ventricular relaxation (grade 1 diastolic  dysfunction). The E/e&' ratio is between 8-15, suggesting  indeterminate LV filling pressure.  - Aortic valve: Trileaflet. Sclerosis without stenosis. There was  no regurgitation.  - Mitral valve: Calcified annulus. Mildly thickened leaflets .  - Left atrium: Moderately dilated at 35 ml/m2.  - Right ventricle: The cavity size was normal. Wall thickness was  normal. Systolic function is low normal. Lateral annulus peak S  velocity: 11.3 cm/s.  - Tricuspid valve: There was no significant regurgitation.  - Inferior vena cava: The vessel was dilated. The respirophasic  diameter changes were blunted (<  50%), consistent with elevated  central venous pressure.     Neuro/Psych PSYCHIATRIC DISORDERS Depression negative neurological ROS     GI/Hepatic Neg liver ROS,   Endo/Other  diabetes, Type 2, Insulin DependentMorbid obesity  Renal/GU CRFRenal disease     Musculoskeletal  (+) Arthritis ,   Abdominal   Peds  Hematology  (+) Blood dyscrasia, anemia , Lab Results      Component                Value               Date                      WBC                      5.2                 07/20/2015                HGB                      10.9 (L)  04/10/2020                HCT                      32.0 (L)            04/10/2020                MCV                      88.7                07/20/2015                PLT                      134 (L)             07/20/2015              Anesthesia Other Findings   Reproductive/Obstetrics                             Anesthesia Physical Anesthesia Plan  ASA: III  Anesthesia Plan: MAC   Post-op Pain Management:    Induction: Intravenous  PONV Risk Score and Plan: 2 and Propofol infusion and Treatment may vary due to age or medical condition  Airway Management Planned: Nasal Cannula  Additional Equipment: None  Intra-op Plan:   Post-operative Plan:   Informed Consent: I have reviewed the patients History and Physical, chart, labs and discussed the procedure including the risks, benefits and alternatives for the proposed anesthesia with the patient or authorized representative who has indicated his/her understanding and acceptance.     Dental advisory given  Plan Discussed with: CRNA and Surgeon  Anesthesia Plan Comments:         Anesthesia Quick Evaluation

## 2020-04-10 NOTE — Op Note (Signed)
Procedure: Left brachiocephalic AV fistula  Preoperative diagnosis: CKD 4  Postoperative diagnosis: Same  Anesthesia: Local with IV sedation  Assistant: Paul Half, PA-C for assistance with exposure and expediting procedure  Operative findings: 6 mm left cephalic vein, 4 mm brachial artery  Operative details: After obtaining informed consent, the patient was taken the operating room.  The patient was placed in supine position on operating table.  After adequate sedation patient's left upper extremities prepped and draped in the usual sterile fashion.  Ultrasound was used to identify the patient's left cephalic vein.  I measured the diameter of the vein at the wrist and it was about 2-1/2 mm.  Subsequently measured the cephalic vein in the upper arm and it was 6 mm in diameter.  I felt this would be a better quality vein for the fistula.  Anesthesia was infiltrated near the antecubital crease.  Transverse incision was made at the antecubital crease carried down through subcutaneous tissues down the level of the vein.  It was dissected free circumferentially and small side branches ligated divided tween silk ties.  Brachial artery was then dissected free in the medial portion incision.  Several small side branches were ligated divided tween silk ties.  The artery was dissected free circumferentially it was about 4 mm in diameter.  The vein was of good quality about 6 mm in diameter.  Patient was given 8000 units of intravenous heparin.  The artery was controlled proximally distally with Vesseloops.  The distal cephalic vein was ligated with a 2-0 silk tie and transected.  It was swung over the level of the brachial artery.  Longitudinal opening was made in the brachial artery and the vein sewn end of vein to side of artery using a running 6-0 Prolene suture.  Despite completion anastomosis it was for blood backbled and thoroughly flushed anastomosis was secured clamps released there is palpable thrill in  fistula immediately.  Hemostasis was obtained.  The subcutaneous tissues were reapproximated using a running 3-0 Vicryl suture.  The skin was closed with a 4-0 Vicryl subcuticular stitch.  Dermabond was applied.  Patient tired procedure well and there were no complications.  Instrument sponge and needle counts correct in the case.  Patient was taken the recovery room in stable condition.  Ruta Hinds, MD [facility] Office: [phone]

## 2020-04-10 NOTE — Interval H&P Note (Signed)
History and Physical Interval Note:  04/10/2020 10:48 AM  Chelsey Lee  has presented today for surgery, with the diagnosis of ESRD.  The various methods of treatment have been discussed with the patient and family. After consideration of risks, benefits and other options for treatment, the patient has consented to  Procedure(s): LEFT ARM ARTERIOVENOUS (AV) FISTULA CREATION (Left) as a surgical intervention.  The patient's history has been reviewed, patient examined, no change in status, stable for surgery.  I have reviewed the patient's chart and labs.  Questions were answered to the patient's satisfaction.     Chelsey Lee

## 2020-04-10 NOTE — Transfer of Care (Signed)
Immediate Anesthesia Transfer of Care Note  Patient: Chelsey Lee  Procedure(s) Performed: LEFT ARM ARTERIOVENOUS (AV) FISTULA CREATION (Left Arm Upper)  Patient Location: PACU  Anesthesia Type:MAC  Level of Consciousness: awake, alert  and patient cooperative  Airway & Oxygen Therapy: Patient Spontanous Breathing and Patient connected to face mask oxygen  Post-op Assessment: Report given to RN and Post -op Vital signs reviewed and stable  Post vital signs: Reviewed and stable  Last Vitals:  Vitals Value Taken Time  BP 153/71 04/10/20 1505  Temp    Pulse 71 04/10/20 1506  Resp 18 04/10/20 1506  SpO2 100 % 04/10/20 1506  Vitals shown include unvalidated device data.  Last Pain:  Vitals:   04/10/20 0954  TempSrc:   PainSc: 0-No pain         Complications: No complications documented.

## 2020-04-10 NOTE — Discharge Instructions (Signed)
[facility]  Discharge Instructions  AV Fistula or Graft Surgery for Dialysis Access  Please refer to the following instructions for your post-procedure care. Your surgeon or physician assistant will discuss any changes with you.  Activity  You may drive the day following your surgery, if you are comfortable and no longer taking prescription pain medication. Resume full activity as the soreness in your incision resolves.  Bathing/Showering  You may shower after you go home. Keep your incision dry for 48 hours. Do not soak in a bathtub, hot tub, or swim until the incision heals completely. You may not shower if you have a hemodialysis catheter.  Incision Care  Clean your incision with mild soap and water after 48 hours. Pat the area dry with a clean towel. You do not need a bandage unless otherwise instructed. Do not apply any ointments or creams to your incision. You may have skin glue on your incision. Do not peel it off. It will come off on its own in about one week. Your arm may swell a bit after surgery. To reduce swelling use pillows to elevate your arm so it is above your heart. Your doctor will tell you if you need to lightly wrap your arm with an ACE bandage.  Diet  Resume your normal diet. There are not special food restrictions following this procedure. In order to heal from your surgery, it is CRITICAL to get adequate nutrition. Your body requires vitamins, minerals, and protein. Vegetables are the best source of vitamins and minerals. Vegetables also provide the perfect balance of protein. Processed food has little nutritional value, so try to avoid this.  Medications  Resume taking all of your medications. If your incision is causing pain, you may take over-the counter pain relievers such as acetaminophen (Tylenol). If you were prescribed a stronger pain medication, please be aware these medications can cause nausea and constipation. Prevent  nausea by taking the medication with a snack or meal. Avoid constipation by drinking plenty of fluids and eating foods with high amount of fiber, such as fruits, vegetables, and grains.  Do not take Tylenol if you are taking prescription pain medications.  Follow up Your surgeon may want to see you in the office following your access surgery. If so, this will be arranged at the time of your surgery.  Please call us immediately for any of the following conditions:  . Increased pain, redness, drainage (pus) from your incision site . Fever of 101 degrees or higher . Severe or worsening pain at your incision site . Hand pain or numbness. .  Reduce your risk of vascular disease:  . Stop smoking. If you would like help, call [facility] at 1-800-QUIT-NOW 825-142-3307) or [place] at 219-546-5122  . Manage your cholesterol . Maintain a desired weight . Control your diabetes . Keep your blood pressure down  Dialysis  It will take several weeks to several months for your new dialysis access to be ready for use. Your surgeon will determine when it is okay to use it. Your nephrologist will continue to direct your dialysis. You can continue to use your Permcath until your new access is ready for use.   04/10/2020 Chelsey Lee [id number] 29-May-1955  Surgeon(s): Elam Dutch, MD  Procedure(s): LEFT ARM ARTERIOVENOUS (AV) FISTULA CREATION   May stick graft immediately   May stick graft on designated area only:   X Do not stick left AV fistula for 12 weeks  If you have any questions, please call the office at [phone].

## 2020-04-10 NOTE — Anesthesia Procedure Notes (Signed)
Procedure Name: MAC Date/Time: 04/10/2020 1:30 PM Performed by: Michele Rockers, CRNA Pre-anesthesia Checklist: Patient identified, Emergency Drugs available, Suction available, Timeout performed and Patient being monitored Patient Re-evaluated:Patient Re-evaluated prior to induction Oxygen Delivery Method: Simple face mask

## 2020-04-11 ENCOUNTER — Encounter (HOSPITAL_COMMUNITY): Payer: Self-pay | Admitting: Vascular Surgery

## 2020-04-11 NOTE — Addendum Note (Signed)
Addendum  created 04/11/20 0900 by Michele Rockers, CRNA   Intraprocedure Staff edited

## 2020-04-13 ENCOUNTER — Other Ambulatory Visit: Payer: Self-pay | Admitting: Internal Medicine

## 2020-04-13 ENCOUNTER — Ambulatory Visit
Admission: RE | Admit: 2020-04-13 | Discharge: 2020-04-13 | Disposition: A | Discharge status: Home / Self Care | Payer: 59 | Source: Ambulatory Visit | Attending: Internal Medicine | Admitting: Internal Medicine

## 2020-04-13 ENCOUNTER — Other Ambulatory Visit: Payer: Self-pay

## 2020-04-13 DIAGNOSIS — R928 Other abnormal and inconclusive findings on diagnostic imaging of breast: Secondary | ICD-10-CM

## 2020-04-29 ENCOUNTER — Other Ambulatory Visit: Payer: Self-pay

## 2020-04-29 DIAGNOSIS — N184 Chronic kidney disease, stage 4 (severe): Secondary | ICD-10-CM

## 2020-05-01 ENCOUNTER — Telehealth: Payer: Self-pay

## 2020-05-01 NOTE — Telephone Encounter (Signed)
Pt sister called asking is her BMI okay to have surgery or does it need to come down.  Sister wanted to advise that she is going to start dialysis but doesn't have a date yet.  So the sister is asking what she should do in order to get pt ready for surgery   Valerie Salts is pts caretaker and would like a call back [phone]

## 2020-05-02 ENCOUNTER — Telehealth: Payer: Self-pay

## 2020-05-02 NOTE — Telephone Encounter (Signed)
Patients sister called regarding last message.  PLEASE CALL PATIENTS SISTER Chelsey Lee to discuss previous message, sister is patients care taker. call back:[phone]

## 2020-05-02 NOTE — Telephone Encounter (Signed)
Call patient her insurance [facility] requires that her BMI be less than 40.  She will also need to be medically cleared by her primary care physician and be stable with her dialysis.

## 2020-05-02 NOTE — Telephone Encounter (Signed)
Pt's BMI is 45.54 please see message below she is getting ready to start dialysis I do not know what you think about having a total knee replacement sch. See message below.

## 2020-05-03 NOTE — Telephone Encounter (Signed)
I called and lm on vm to advise of message below. To call with any questions.  

## 2020-05-03 NOTE — Telephone Encounter (Signed)
Called and lm on vm for pt's sister to advise that her BMI would need to be below 40 per insurance requirements and that she would need to be cleared by her PCP and stable with her dialysis before we could sch joint replacement surgery.t o call with any questions.

## 2020-05-11 DIAGNOSIS — I679 Cerebrovascular disease, unspecified: Secondary | ICD-10-CM | POA: Insufficient documentation

## 2020-05-12 ENCOUNTER — Other Ambulatory Visit: Payer: Self-pay

## 2020-05-12 ENCOUNTER — Encounter: Payer: 59 | Attending: Nephrology | Admitting: Registered"

## 2020-05-12 VITALS — Ht 66.0 in | Wt 292.1 lb

## 2020-05-12 DIAGNOSIS — E119 Type 2 diabetes mellitus without complications: Secondary | ICD-10-CM | POA: Insufficient documentation

## 2020-05-12 NOTE — Patient Instructions (Addendum)
Call your insurance company to see if they cover Continuous Glucose Monitor and if so ask what you co-pay would be for [facility] or the Dexcom G6 including the Reader since your phone won't work to get the blood sugar readings   Use the Placemat for kidney-friendly meals using the goals for meat, starch, veg, fruit, milk & fat.  Return for follow up to continue working on meal planning

## 2020-05-15 ENCOUNTER — Encounter: Payer: Self-pay | Admitting: Registered"

## 2020-05-15 ENCOUNTER — Encounter: Payer: Self-pay | Admitting: Orthopedic Surgery

## 2020-05-15 ENCOUNTER — Ambulatory Visit (INDEPENDENT_AMBULATORY_CARE_PROVIDER_SITE_OTHER): Payer: 59 | Admitting: Physician Assistant

## 2020-05-15 ENCOUNTER — Ambulatory Visit (INDEPENDENT_AMBULATORY_CARE_PROVIDER_SITE_OTHER): Payer: 59

## 2020-05-15 VITALS — Ht 66.0 in | Wt 294.0 lb

## 2020-05-15 DIAGNOSIS — M25562 Pain in left knee: Secondary | ICD-10-CM | POA: Diagnosis not present

## 2020-05-15 DIAGNOSIS — M79672 Pain in left foot: Secondary | ICD-10-CM

## 2020-05-15 MED ORDER — LIDOCAINE HCL 1 % IJ SOLN
5.0000 mL | INTRAMUSCULAR | Status: AC | PRN
Start: 2020-05-15 — End: 2020-05-15
  Administered 2020-05-15: 5 mL

## 2020-05-15 MED ORDER — METHYLPREDNISOLONE ACETATE 40 MG/ML IJ SUSP
40.0000 mg | INTRAMUSCULAR | Status: AC | PRN
Start: 2020-05-15 — End: 2020-05-15
  Administered 2020-05-15: 40 mg via INTRA_ARTICULAR

## 2020-05-15 NOTE — Progress Notes (Signed)
Office Visit Note   Patient: Chelsey Lee           Date of Birth: 02-Oct-1955           MRN: [id number] Visit Date: 05/15/2020              Requested by: Willey Blade, [address],  [place] [address] PCP: Willey Blade, MD  Chief Complaint  Patient presents with  . Left Knee - Pain  . Left Foot - Pain      HPI: Patient is a pleasant 65 year old woman with known arthritis of her right knee.  She says this has been fairly stable and she is not having much pain.  She is complaining of left knee pain and grinding.  She denies any injuries.  She also has what she thought is a hammertoe of her second toe and the end of the toe is hitting her shoe she is wondering if anything can be done conservatively for this she is also requesting a brace for her left knee  Assessment & Plan: Visit Diagnoses:  1. Pain in left foot   2. Acute pain of left knee     Plan: Patient was provided a prescription for a knee brace.  She also was given a crescent for her claw toe.  She was told to adjust this but not to make it too tight.  We also went forward with an injection of her left knee today follow-up in 3 weeks  Follow-Up Instructions: No follow-ups on file.   Ortho Exam  Patient is alert, oriented, no adenopathy, well-dressed, normal affect, normal respiratory effort. Left knee no effusion no swelling no erythema she has tenderness over the lateral joint line.  She has valgus malalignment.  No evidence of any infective process. Left foot she has palpable pulses.  No evidence of infection or cysts or digit swelling.  She does have clawing of the second toe tip of the toe does not have any callusing or skin breakdown at this time no signs of cellulitis or infection  Imaging: XR Knee 1-2 Views Left  Result Date: 05/15/2020 X-rays of her left knee demonstrate valgus alignment with loss of joint space especially in the lateral compartment and associated bone  spurs and osteophytes  XR Foot 2 Views Left  Result Date: 05/15/2020 X-rays of her foot do not show any destructive changes overall well-maintained alignment she does have clawing of the lesser toes  No images are attached to the encounter.  Labs: Lab Results  Component Value Date   LABURIC 8.9 (H) 03/06/2020     Lab Results  Component Value Date   ALBUMIN 3.1 (L) 07/20/2015    No results found for: MG No results found for: VD25OH  No results found for: PREALBUMIN CBC EXTENDED Latest Ref Rng & Units 04/10/2020 07/20/2015 08/01/2011  WBC 4.0 - 10.5 K/uL - 5.2 -  RBC 3.87 - 5.11 MIL/uL - 2.92(L) -  HGB 12.0 - 15.0 g/dL 10.9(L) 8.3(L) 10.6(L)  HCT 36.0 - 46.0 % 32.0(L) 25.9(L) -  PLT 150 - 400 K/uL - 134(L) -  NEUTROABS 1.7 - 7.7 K/uL - 3.0 -  LYMPHSABS 0.7 - 4.0 K/uL - 1.5 -     Body mass index is 47.45 kg/m.  Orders:  Orders Placed This Encounter  Procedures  . XR Knee 1-2 Views Left  . XR Foot 2 Views Left   No orders of the defined types were placed in this encounter.  Procedures: Large Joint Inj: L knee on 05/15/2020 9:31 AM Indications: pain and diagnostic evaluation Details: 22 G 1.5 in needle, anteromedial approach  Arthrogram: No  Medications: 40 mg methylPREDNISolone acetate 40 MG/ML; 5 mL lidocaine 1 % Outcome: tolerated well, no immediate complications Procedure, treatment alternatives, risks and benefits explained, specific risks discussed. Consent was given by the patient.      Clinical Data: No additional findings.  ROS:  All other systems negative, except as noted in the HPI. Review of Systems  Objective: Vital Signs: Ht 5\' 6"  (1.676 m)   Wt 294 lb (133.4 kg)   BMI 47.45 kg/m   Specialty Comments:  No specialty comments available.  PMFS History: Patient Active Problem List   Diagnosis Date Noted  . Cerebrovascular disease 05/11/2020  . Gout 03/09/2020  . Allergic rhinitis 01/25/2020  . Benign hypertensive renal disease  01/25/2020  . Colon cancer screening 01/25/2020  . Gastroesophageal reflux disease without esophagitis 01/25/2020  . Hyperlipidemia 01/25/2020  . Morbid obesity ([place]) 01/25/2020  . Personal history of colonic polyps 01/25/2020  . Sleep apnea 01/25/2020  . Vitamin D deficiency 01/25/2020  . Controlled type 2 diabetes mellitus without complication ([place]) 29/93/7169  . Chronic kidney disease, stage 4 (severe) ([place]) 06/29/2019  . Long term (current) use of insulin ([place]) 06/29/2019  . Epidural lipomatosis 03/02/2019  . Spondylolisthesis of lumbar region 03/02/2019  . Spinal stenosis of lumbar region with neurogenic claudication 03/02/2019  . Left wrist tendinitis 09/07/2018  . Localized osteoarthritis of left knee 04/03/2018  . Celiac disease 08/21/2016  . Abnormal thyroid function test 04/01/2016  . Pain in thumb joint with movement of right hand 09/14/2015  . 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 Moderate major depression, single episode ([place]) 02/02/2010  . Diabetic renal disease ([place]) 06/07/2009  . Anemia 01/18/2009  . Essential hypertension 01/18/2009   Past Medical History:  Diagnosis Date  . Arthritis   . Chronic kidney disease    stage 4  . Diabetes mellitus   . GERD (gastroesophageal reflux disease)   . Hyperlipemia   . Hypertension   . Seasonal allergies     History reviewed. No pertinent family history.  Past Surgical History:  Procedure Laterality Date  . AV FISTULA PLACEMENT Left 04/10/2020   Procedure: LEFT ARM ARTERIOVENOUS (AV) FISTULA CREATION;  Surgeon: Elam Dutch, MD;  Location: [place];  Service: Vascular;  Laterality: Left;  . CAPSULOTOMY  01/13/2012   Procedure: MINOR CAPSULOTOMY;  Surgeon: Myrtha Mantis., MD;  Location: [place];  Service: Ophthalmology;  Laterality: Left;  . DORSAL COMPARTMENT RELEASE  2009   rt   . KNEE ARTHROSCOPY  02,04   right and left  . STERIOD INJECTION  08/01/2011   Procedure: STEROID INJECTION;  Surgeon: Cammie Sickle.,  MD;  Location: [place];  Service: Orthopedics;  Laterality: Left;  cmc left thumb  . TRIGGER FINGER RELEASE  2006   rt thumb  . TRIGGER FINGER RELEASE  08/01/2011   Procedure: RELEASE TRIGGER FINGER/A-1 PULLEY;  Surgeon: Cammie Sickle., MD;  Location: [place];  Service: Orthopedics;  Laterality: Left;  left thumb  . YAG LASER APPLICATION  67/89/3810   Procedure: YAG LASER APPLICATION;  Surgeon: Myrtha Mantis., MD;  Location: [facility] [place];  Service: Ophthalmology;  Laterality: Left;   Social History   Occupational History  . Not on file  Tobacco Use  . Smoking status: Never Smoker  . Smokeless tobacco: Never  Used  Vaping Use  . Vaping Use: Never used  Substance and Sexual Activity  . Alcohol use: No  . Drug use: No  . Sexual activity: Not on file

## 2020-05-15 NOTE — Progress Notes (Signed)
Diabetes Self-Management Education  Visit Type: Follow-up  Appt. Start Time: 1015 Appt. End Time: 1055  05/18/2020  [name], identified by name and date of birth, is a 65 y.o. female with a diagnosis of Diabetes:  .   ASSESSMENT  Height 5\' 6"  (1.676 m), weight 292 lb 1.6 oz (132.5 kg). Body mass index is 47.15 kg/m.   Problem list:  Gout, GERD, T2DM (NPH insulin bid 45 u am/ 30 u pm);  CKD stage 4, fistula placed for dialysis Dr. Forde Dandy added Celiac disease to problem list 08/21/2016. No notes or labs specific to diagnosis in chart notes. LVM for Dr Roland Earl nurse to verify diagnosis.  Discussed CGM (freestyle libre) and Placemat kidney-friendly meals   Patient states chceking FBS (150) Also checks around midnight 180-190; patient states she eats dinner 6 or 7 pm and no other snacks in the evening.   Patient could not clearly explain how she takes insulin/food at night, sounds like she may be skipping her evening insulin depending on her blood sugar reading or whether or not she plans to eat. Pt states she wakes up with FBG in 40s when she dosen't have a snack at night but states she doesn't have snacks at night and has only had 1 hypoglycemic event. When she does have a snack she states she drinks sugar free Fres[place], nothing else.   Patient wants ideas for recipes/meal planning. Next visit will provide more practice making meals and snacks.  Due to patient anticipating dialysis today we discussed balanced meals considering protein sodium and carbohydrate balance. Patient IBW: 59 kg; adjusted body weight 87 kg; Protein needs: 72-82 g   Diabetes Self-Management Education - 05/15/20 1701      Visit Information   Visit Type Follow-up      Complications   Number of hypoglycemic episodes per month 1   pt states it was 40 and drank sugar free fresca, was able to verify with meter     Exercise   Exercise Type ADL's      Patient Education   Nutrition management  Role  of diet in the treatment of diabetes and the relationship between the three main macronutrients and blood glucose level;Other (comment)   sodium content   Monitoring Other (comment)   CGM option     Individualized Goals (developed by patient)   Nutrition Follow meal plan discussed    Monitoring  Other (comment)   look into CGM     Outcomes   Expected Outcomes Other (comment)   pt appears to have limited understanding and poor historian   Future DMSE 3-4 months    Program Status Not Completed      Subsequent Visit   Since your last visit have you continued or begun to take your medications as prescribed? Yes           Individualized Plan for Diabetes Self-Management Training:   Learning Objective:  Patient will have a greater understanding of diabetes self-management. Patient education plan is to attend individual and/or group sessions per assessed needs and concerns.  Patient Instructions  Call your insurance company to see if they cover Continuous Glucose Monitor and if so ask what you co-pay would be for [facility] or the Dexcom G6 including the Reader since your phone won't work to get the blood sugar readings   Use the Placemat for kidney-friendly meals using the goals for meat, starch, veg, fruit, milk & fat.  Return for follow up to continue working on  meal planning   Expected Outcomes:  Other (comment) (pt appears to have limited understanding and poor historian)  Air traffic controller provided: Environmental manager for kidney-friendly meals.  If problems or questions, patient to contact team via:  Phone  Future DSME appointment: 3-4 months

## 2020-05-18 ENCOUNTER — Ambulatory Visit (HOSPITAL_COMMUNITY)
Admission: RE | Admit: 2020-05-18 | Discharge: 2020-05-18 | Disposition: A | Discharge status: Home / Self Care | Payer: 59 | Source: Ambulatory Visit | Attending: Vascular Surgery | Admitting: Vascular Surgery

## 2020-05-18 ENCOUNTER — Ambulatory Visit (INDEPENDENT_AMBULATORY_CARE_PROVIDER_SITE_OTHER): Payer: 59 | Admitting: Physician Assistant

## 2020-05-18 ENCOUNTER — Other Ambulatory Visit: Payer: Self-pay

## 2020-05-18 VITALS — BP 180/80 | HR 75 | Temp 98.1°F | Resp 20 | Ht 66.0 in | Wt 291.1 lb

## 2020-05-18 DIAGNOSIS — N184 Chronic kidney disease, stage 4 (severe): Secondary | ICD-10-CM | POA: Insufficient documentation

## 2020-05-18 NOTE — H&P (View-Only) (Signed)
POST OPERATIVE OFFICE NOTE    CC:  F/u for surgery  HPI:  This is a 65 y.o. female who is s/p Left First stage basilic av fistula on 8/46/96  by Dr. Oneida Alar.    Pt returns today for follow up.  Pt states she has no symptoms of steal and denise fevers and chills.  No Known Allergies  Current Outpatient Medications  Medication Sig Dispense Refill  . allopurinol (ZYLOPRIM) 100 MG tablet Take 1 tablet (100 mg total) by mouth daily. 60 tablet 3  . atorvastatin (LIPITOR) 20 MG tablet Take 20 mg by mouth daily.    Marland Kitchen buPROPion (WELLBUTRIN SR) 150 MG 12 hr tablet Take 150 mg by mouth 2 (two) times daily.    . carvedilol (COREG) 25 MG tablet Take 25 mg by mouth 2 (two) times daily with a meal.    . Continuous Blood Gluc Sensor (FREESTYLE LIBRE 2 SENSOR) MISC     . escitalopram (LEXAPRO) 10 MG tablet Take 10 mg by mouth daily.    . fenofibrate 160 MG tablet Take 160 mg by mouth daily.    . ferrous sulfate 325 (65 FE) MG EC tablet Take 325 mg by mouth 3 (three) times daily with meals.    Marland Kitchen FREESTYLE LITE test strip SMARTSIG:Via Meter 2-3 Times Daily    . furosemide (LASIX) 80 MG tablet Take 80-120 mg by mouth See admin instructions. Take 120 mg by mouth in the morning and 40 mg in the afternoon    . glucose blood (ONETOUCH ULTRA) test strip     . insulin NPH-regular Human (70-30) 100 UNIT/ML injection Inject 30-45 Units into the skin daily with breakfast. Inject 45 units into the skin in the morning and 30 units at night    . liraglutide (VICTOZA) 18 MG/3ML SOPN Inject 1.8 mg into the skin.    . Magnesium Oxide 250 MG TABS     . Vitamin D, Ergocalciferol, 50000 units CAPS      No current facility-administered medications for this visit.     ROS:  See HPI  Physical Exam:    Findings:  +--------------------+----------+-----------------+--------+  AVF         PSV (cm/s)Flow Vol (mL/min)Comments  +--------------------+----------+-----------------+--------+  Native artery  inflow  240     1313          +--------------------+----------+-----------------+--------+  AVF Anastomosis     450                 +--------------------+----------+-----------------+--------+     +------------+----------+-------------+----------+----------------+  OUTFLOW VEINPSV (cm/s)Diameter (cm)Depth (cm)  Describe    +------------+----------+-------------+----------+----------------+  Shoulder    145    0.68     1.24            +------------+----------+-------------+----------+----------------+  Prox UA     163    0.71     1.55  competing branch  +------------+----------+-------------+----------+----------------+  Mid UA     79    0.82     0.80            +------------+----------+-------------+----------+----------------+  Dist UA     222    0.70     0.50  competing branch  +------------+----------+-------------+----------+----------------+  AC Fossa    770    0.85     0.31            +------------+----------+-------------+----------+----------------+        Summary:  Patent arteriovenous fistula with 2 small competing branches observed.   Incision:  Well healed Extremities:  Palpable  thrill in fistula and radial pulse on the left UE Neuro: sensation intact    Assessment/Plan:  This is a 65 y.o. female who is s/p:first stage basilic AV fistula  The fistula has matured well and the diameter is > 0.6.  The depth of the fistula will need to be addressed with a second stage basilic transposition surgery.    She is not currently requiring HD.    Roxy Horseman PA-C [facility] [phone]   Clinic MD:  Oneida Alar

## 2020-05-18 NOTE — Progress Notes (Signed)
POST OPERATIVE OFFICE NOTE    CC:  F/u for surgery  HPI:  This is a 65 y.o. female who is s/p Left First stage basilic av fistula on 05/14/51  by Dr. Oneida Alar.    Pt returns today for follow up.  Pt states she has no symptoms of steal and denise fevers and chills.  No Known Allergies  Current Outpatient Medications  Medication Sig Dispense Refill  . allopurinol (ZYLOPRIM) 100 MG tablet Take 1 tablet (100 mg total) by mouth daily. 60 tablet 3  . atorvastatin (LIPITOR) 20 MG tablet Take 20 mg by mouth daily.    Marland Kitchen buPROPion (WELLBUTRIN SR) 150 MG 12 hr tablet Take 150 mg by mouth 2 (two) times daily.    . carvedilol (COREG) 25 MG tablet Take 25 mg by mouth 2 (two) times daily with a meal.    . Continuous Blood Gluc Sensor (FREESTYLE LIBRE 2 SENSOR) MISC     . escitalopram (LEXAPRO) 10 MG tablet Take 10 mg by mouth daily.    . fenofibrate 160 MG tablet Take 160 mg by mouth daily.    . ferrous sulfate 325 (65 FE) MG EC tablet Take 325 mg by mouth 3 (three) times daily with meals.    Marland Kitchen FREESTYLE LITE test strip SMARTSIG:Via Meter 2-3 Times Daily    . furosemide (LASIX) 80 MG tablet Take 80-120 mg by mouth See admin instructions. Take 120 mg by mouth in the morning and 40 mg in the afternoon    . glucose blood (ONETOUCH ULTRA) test strip     . insulin NPH-regular Human (70-30) 100 UNIT/ML injection Inject 30-45 Units into the skin daily with breakfast. Inject 45 units into the skin in the morning and 30 units at night    . liraglutide (VICTOZA) 18 MG/3ML SOPN Inject 1.8 mg into the skin.    . Magnesium Oxide 250 MG TABS     . Vitamin D, Ergocalciferol, 50000 units CAPS      No current facility-administered medications for this visit.     ROS:  See HPI  Physical Exam:    Findings:  +--------------------+----------+-----------------+--------+  AVF         PSV (cm/s)Flow Vol (mL/min)Comments  +--------------------+----------+-----------------+--------+  Native artery  inflow  240     1313          +--------------------+----------+-----------------+--------+  AVF Anastomosis     450                 +--------------------+----------+-----------------+--------+     +------------+----------+-------------+----------+----------------+  OUTFLOW VEINPSV (cm/s)Diameter (cm)Depth (cm)  Describe    +------------+----------+-------------+----------+----------------+  Shoulder    145    0.68     1.24            +------------+----------+-------------+----------+----------------+  Prox UA     163    0.71     1.55  competing branch  +------------+----------+-------------+----------+----------------+  Mid UA     79    0.82     0.80            +------------+----------+-------------+----------+----------------+  Dist UA     222    0.70     0.50  competing branch  +------------+----------+-------------+----------+----------------+  AC Fossa    770    0.85     0.31            +------------+----------+-------------+----------+----------------+        Summary:  Patent arteriovenous fistula with 2 small competing branches observed.   Incision:  Well healed Extremities:  Palpable  thrill in fistula and radial pulse on the left UE Neuro: sensation intact    Assessment/Plan:  This is a 65 y.o. female who is s/p:first stage basilic AV fistula  The fistula has matured well and the diameter is > 0.6.  The depth of the fistula will need to be addressed with a second stage basilic transposition surgery.    She is not currently requiring HD.    Roxy Horseman PA-C [facility] [phone]   Clinic MD:  Oneida Alar

## 2020-05-23 ENCOUNTER — Other Ambulatory Visit (HOSPITAL_COMMUNITY): Payer: 59

## 2020-05-26 ENCOUNTER — Encounter (HOSPITAL_COMMUNITY): Payer: Self-pay | Admitting: Vascular Surgery

## 2020-05-26 ENCOUNTER — Other Ambulatory Visit (HOSPITAL_COMMUNITY)
Admission: RE | Admit: 2020-05-26 | Discharge: 2020-05-26 | Disposition: A | Discharge status: Home / Self Care | Payer: 59 | Source: Ambulatory Visit | Attending: Vascular Surgery | Admitting: Vascular Surgery

## 2020-05-26 ENCOUNTER — Other Ambulatory Visit (HOSPITAL_COMMUNITY): Payer: 59

## 2020-05-26 ENCOUNTER — Other Ambulatory Visit: Payer: Self-pay

## 2020-05-26 DIAGNOSIS — Z20822 Contact with and (suspected) exposure to covid-19: Secondary | ICD-10-CM | POA: Diagnosis not present

## 2020-05-26 DIAGNOSIS — Z01812 Encounter for preprocedural laboratory examination: Secondary | ICD-10-CM | POA: Insufficient documentation

## 2020-05-26 MED ORDER — CEFAZOLIN IN SODIUM CHLORIDE 3-0.9 GM/100ML-% IV SOLN
3.0000 g | INTRAVENOUS | Status: DC
  Filled 2020-05-26 (×2): qty 100

## 2020-05-26 NOTE — Progress Notes (Signed)
Per diabetes coordinator - pre op instructions for NPH 70/30 insulin: on 5/8 take usual AM dose, take 15 units PM dose. On 5/9 do not take any.

## 2020-05-26 NOTE — Progress Notes (Signed)
PCP - Dr. Karlton Lemon  Cardiologist -   Chest x-ray -  EKG - 04/10/20 Stress Test -  ECHO - 2016 Cardiac Cath -   Sleep Study - yes  CPAP - yes   Fasting Blood Sugar:  156 Checks Blood Sugar:  2x/day   COVID TEST- 5/6 pending result   Anesthesia review: n/a  -------------  SDW INSTRUCTIONS:  Your procedure is scheduled on 05/29/20. Please report to [facility] Main Entrance "A" at [place] and check in at the Admitting office. Call this number if you have problems the morning of surgery: [phone]   Remember: Do not eat or drink after midnight the night before your surgery  Medications to take morning of surgery with a sip of water include: allopurinol (ZYLOPRIM)  atorvastatin (LIPITOR)  buPROPion (WELLBUTRIN SR) carvedilol (COREG) cyclobenzaprine (FLEXERIL) escitalopram (LEXAPRO)  fenofibrate   ** PLEASE check your blood sugar the morning of your surgery when you wake up and every 2 hours until you get to the Short Stay unit.  If your blood sugar is less than 70 mg/dL, you will need to treat for low blood sugar: - Do not take insulin. - Treat a low blood sugar (less than 70 mg/dL) with  cup of clear juice (cranberry or apple), 4 glucose tablets, OR glucose gel. - Recheck blood sugar in 15 minutes after treatment (to make sure it is greater than 70 mg/dL). If your blood sugar is not greater than 70 mg/dL on recheck, call [phone] for further instructions.  insulin NPH-regular Human (70-30) 5/8: AM take as usual, PM 15 units 5/9: none  liraglutide (VICTOZA)  5/8: take as usual 5/9: none   As of today, STOP taking any Aspirin (unless otherwise instructed by your surgeon), Aleve, Naproxen, Ibuprofen, Motrin, Advil, Goody's, BC's, all herbal medications, fish oil, and all vitamins.    The Morning of Surgery Do not wear jewelry, make-up or nail polish. Do not wear lotions, powders, or perfumes, or deodorant Do not shave 48 hours prior to surgery.   Do not  bring valuables to the hospital. [facility] is not responsible for any belongings or valuables. If you are a smoker, DO NOT Smoke 24 hours prior to surgery If you wear a CPAP at night please bring your mask the morning of surgery  Remember that you must have someone to transport you home after your surgery, and remain with you for 24 hours if you are discharged the same day. Please bring cases for contacts, glasses, hearing aids, dentures or bridgework because it cannot be worn into surgery.   Patients discharged the day of surgery will not be allowed to drive home.   Please shower the NIGHT BEFORE SURGERY and the MORNING OF SURGERY with DIAL Soap. Wear comfortable clothes the morning of surgery. Oral Hygiene is also important to reduce your risk of infection.  Remember - BRUSH YOUR TEETH THE MORNING OF SURGERY WITH YOUR REGULAR TOOTHPASTE  Patient denies shortness of breath, fever, cough and chest pain.

## 2020-05-27 LAB — SARS CORONAVIRUS 2 (TAT 6-24 HRS): SARS Coronavirus 2: NEGATIVE

## 2020-05-29 ENCOUNTER — Other Ambulatory Visit: Payer: Self-pay

## 2020-05-29 ENCOUNTER — Encounter (HOSPITAL_COMMUNITY): Payer: Self-pay | Admitting: Vascular Surgery

## 2020-05-29 ENCOUNTER — Ambulatory Visit (HOSPITAL_COMMUNITY): Payer: 59 | Admitting: Anesthesiology

## 2020-05-29 ENCOUNTER — Encounter (HOSPITAL_COMMUNITY)
Admission: RE | Disposition: A | Discharge status: Home / Self Care | Payer: Self-pay | Source: Home / Self Care | Attending: Vascular Surgery

## 2020-05-29 ENCOUNTER — Ambulatory Visit (HOSPITAL_COMMUNITY)
Admission: RE | Admit: 2020-05-29 | Discharge: 2020-05-29 | Disposition: A | Discharge status: Home / Self Care | Payer: 59 | Attending: Vascular Surgery | Admitting: Vascular Surgery

## 2020-05-29 DIAGNOSIS — Z6841 Body Mass Index (BMI) 40.0 and over, adult: Secondary | ICD-10-CM | POA: Diagnosis not present

## 2020-05-29 DIAGNOSIS — Z79899 Other long term (current) drug therapy: Secondary | ICD-10-CM | POA: Diagnosis not present

## 2020-05-29 DIAGNOSIS — N185 Chronic kidney disease, stage 5: Secondary | ICD-10-CM

## 2020-05-29 DIAGNOSIS — Z794 Long term (current) use of insulin: Secondary | ICD-10-CM | POA: Insufficient documentation

## 2020-05-29 DIAGNOSIS — T82898A Other specified complication of vascular prosthetic devices, implants and grafts, initial encounter: Secondary | ICD-10-CM

## 2020-05-29 DIAGNOSIS — Z452 Encounter for adjustment and management of vascular access device: Secondary | ICD-10-CM | POA: Insufficient documentation

## 2020-05-29 HISTORY — DX: Sleep apnea, unspecified: G47.30

## 2020-05-29 HISTORY — PX: FISTULA SUPERFICIALIZATION: SHX6341

## 2020-05-29 LAB — GLUCOSE, CAPILLARY
Glucose-Capillary: 100 mg/dL — ABNORMAL HIGH (ref 70–99)
Glucose-Capillary: 82 mg/dL (ref 70–99)
Glucose-Capillary: 75 mg/dL (ref 70–99)

## 2020-05-29 LAB — POCT I-STAT, CHEM 8
BUN: 62 mg/dL — ABNORMAL HIGH (ref 8–23)
Calcium, Ion: 1.18 mmol/L (ref 1.15–1.40)
Chloride: 106 mmol/L (ref 98–111)
Creatinine, Ser: 4.2 mg/dL — ABNORMAL HIGH (ref 0.44–1.00)
Glucose, Bld: 92 mg/dL (ref 70–99)
HCT: 34 % — ABNORMAL LOW (ref 36.0–46.0)
Hemoglobin: 11.6 g/dL — ABNORMAL LOW (ref 12.0–15.0)
Potassium: 4.5 mmol/L (ref 3.5–5.1)
Sodium: 141 mmol/L (ref 135–145)
TCO2: 25 mmol/L (ref 22–32)

## 2020-05-29 SURGERY — FISTULA SUPERFICIALIZATION
Anesthesia: General | Site: Arm Upper | Laterality: Left

## 2020-05-29 MED ORDER — 0.9 % SODIUM CHLORIDE (POUR BTL) OPTIME
TOPICAL | Status: DC | PRN
  Administered 2020-05-29: 1000 mL

## 2020-05-29 MED ORDER — PROPOFOL 10 MG/ML IV BOLUS
INTRAVENOUS | Status: DC | PRN
  Administered 2020-05-29 (×2): 40 mg via INTRAVENOUS
  Administered 2020-05-29: 120 mg via INTRAVENOUS

## 2020-05-29 MED ORDER — MIDAZOLAM HCL 2 MG/2ML IJ SOLN
INTRAMUSCULAR | Status: AC
  Filled 2020-05-29: qty 2

## 2020-05-29 MED ORDER — HYDROCODONE-ACETAMINOPHEN 5-325 MG PO TABS: 1.0000 | ORAL_TABLET | Freq: Four times a day (QID) | ORAL | 0 refills | Status: DC | PRN

## 2020-05-29 MED ORDER — EPHEDRINE SULFATE-NACL 50-0.9 MG/10ML-% IV SOSY
PREFILLED_SYRINGE | INTRAVENOUS | Status: DC | PRN
  Administered 2020-05-29 (×5): 10 mg via INTRAVENOUS

## 2020-05-29 MED ORDER — FENTANYL CITRATE (PF) 100 MCG/2ML IJ SOLN
INTRAMUSCULAR | Status: AC
  Filled 2020-05-29: qty 2

## 2020-05-29 MED ORDER — PROMETHAZINE HCL 25 MG/ML IJ SOLN: 6.2500 mg | INTRAMUSCULAR | Status: DC | PRN

## 2020-05-29 MED ORDER — SODIUM CHLORIDE 0.9 % IV SOLN: INTRAVENOUS | Status: DC

## 2020-05-29 MED ORDER — GLYCOPYRROLATE PF 0.2 MG/ML IJ SOSY
PREFILLED_SYRINGE | INTRAMUSCULAR | Status: DC | PRN
  Administered 2020-05-29: .2 mg via INTRAVENOUS

## 2020-05-29 MED ORDER — LIDOCAINE 2% (20 MG/ML) 5 ML SYRINGE
INTRAMUSCULAR | Status: DC | PRN
  Administered 2020-05-29: 80 mg via INTRAVENOUS

## 2020-05-29 MED ORDER — HYDROCODONE-ACETAMINOPHEN 5-325 MG PO TABS
1.0000 | ORAL_TABLET | Freq: Once | ORAL | Status: AC
  Administered 2020-05-29: 1 via ORAL
  Filled 2020-05-29: qty 1

## 2020-05-29 MED ORDER — CHLORHEXIDINE GLUCONATE 0.12 % MT SOLN
OROMUCOSAL | Status: AC
  Administered 2020-05-29: 15 mL via OROMUCOSAL
  Filled 2020-05-29: qty 15

## 2020-05-29 MED ORDER — SODIUM CHLORIDE 0.9 % IV SOLN
INTRAVENOUS | Status: DC | PRN
  Administered 2020-05-29: 500 mL

## 2020-05-29 MED ORDER — HYDRALAZINE HCL 20 MG/ML IJ SOLN: 10.0000 mg | Freq: Once | INTRAMUSCULAR | Status: DC

## 2020-05-29 MED ORDER — LIDOCAINE HCL (PF) 1 % IJ SOLN
INTRAMUSCULAR | Status: AC
  Filled 2020-05-29: qty 30

## 2020-05-29 MED ORDER — SODIUM CHLORIDE 0.9 % IV SOLN
INTRAVENOUS | Status: AC
  Filled 2020-05-29: qty 1.2

## 2020-05-29 MED ORDER — PROPOFOL 10 MG/ML IV BOLUS
INTRAVENOUS | Status: AC
  Filled 2020-05-29: qty 20

## 2020-05-29 MED ORDER — CHLORHEXIDINE GLUCONATE 4 % EX LIQD: 60.0000 mL | Freq: Once | CUTANEOUS | Status: DC

## 2020-05-29 MED ORDER — CHLORHEXIDINE GLUCONATE 0.12 % MT SOLN: 15.0000 mL | Freq: Once | OROMUCOSAL | Status: AC

## 2020-05-29 MED ORDER — MIDAZOLAM HCL 5 MG/5ML IJ SOLN
INTRAMUSCULAR | Status: DC | PRN
  Administered 2020-05-29: 2 mg via INTRAVENOUS

## 2020-05-29 MED ORDER — ORAL CARE MOUTH RINSE: 15.0000 mL | Freq: Once | OROMUCOSAL | Status: AC

## 2020-05-29 MED ORDER — HYDROCODONE-ACETAMINOPHEN 5-325 MG PO TABS
ORAL_TABLET | ORAL | Status: AC
  Filled 2020-05-29: qty 1

## 2020-05-29 MED ORDER — FENTANYL CITRATE (PF) 250 MCG/5ML IJ SOLN
INTRAMUSCULAR | Status: AC
  Filled 2020-05-29: qty 5

## 2020-05-29 MED ORDER — FENTANYL CITRATE (PF) 100 MCG/2ML IJ SOLN: 25.0000 ug | INTRAMUSCULAR | Status: DC | PRN

## 2020-05-29 MED ORDER — ONDANSETRON HCL 4 MG/2ML IJ SOLN
INTRAMUSCULAR | Status: DC | PRN
  Administered 2020-05-29: 4 mg via INTRAVENOUS

## 2020-05-29 MED ORDER — ACETAMINOPHEN 500 MG PO TABS
1000.0000 mg | ORAL_TABLET | Freq: Once | ORAL | Status: AC
  Administered 2020-05-29: 1000 mg via ORAL
  Filled 2020-05-29: qty 2

## 2020-05-29 MED ORDER — PHENYLEPHRINE 40 MCG/ML (10ML) SYRINGE FOR IV PUSH (FOR BLOOD PRESSURE SUPPORT)
PREFILLED_SYRINGE | INTRAVENOUS | Status: DC | PRN
  Administered 2020-05-29 (×3): 40 ug via INTRAVENOUS
  Administered 2020-05-29: 80 ug via INTRAVENOUS
  Administered 2020-05-29: 40 ug via INTRAVENOUS

## 2020-05-29 MED ORDER — FENTANYL CITRATE (PF) 250 MCG/5ML IJ SOLN
INTRAMUSCULAR | Status: DC | PRN
  Administered 2020-05-29: 25 ug via INTRAVENOUS
  Administered 2020-05-29: 50 ug via INTRAVENOUS
  Administered 2020-05-29: 25 ug via INTRAVENOUS

## 2020-05-29 MED ORDER — METOPROLOL TARTRATE 50 MG PO TABS: 100.0000 mg | ORAL_TABLET | Freq: Once | ORAL | Status: DC

## 2020-05-29 MED ORDER — DEXTROSE 5 % IV SOLN
INTRAVENOUS | Status: DC | PRN
  Administered 2020-05-29: 3 g via INTRAVENOUS

## 2020-05-29 MED ORDER — LACTATED RINGERS IV SOLN: INTRAVENOUS | Status: DC

## 2020-05-29 SURGICAL SUPPLY — 37 items
ADH SKN CLS APL DERMABOND .7 (GAUZE/BANDAGES/DRESSINGS) ×2
AGENT HMST SPONGE THK3/8 (HEMOSTASIS)
ARMBAND PINK RESTRICT EXTREMIT (MISCELLANEOUS) ×3 IMPLANT
CANISTER SUCT 3000ML PPV (MISCELLANEOUS) ×3 IMPLANT
CANNULA VESSEL 3MM 2 BLNT TIP (CANNULA) ×3 IMPLANT
CLIP VESOCCLUDE MED 24/CT (CLIP) ×3 IMPLANT
CLIP VESOCCLUDE SM WIDE 24/CT (CLIP) ×3 IMPLANT
COVER PROBE W GEL 5X96 (DRAPES) IMPLANT
DECANTER SPIKE VIAL GLASS SM (MISCELLANEOUS) ×3 IMPLANT
DERMABOND ADVANCED (GAUZE/BANDAGES/DRESSINGS) ×1
DERMABOND ADVANCED .7 DNX12 (GAUZE/BANDAGES/DRESSINGS) ×2 IMPLANT
DRAIN PENROSE 0.25X18 (DRAIN) ×2 IMPLANT
DRAIN PENROSE 1/4X12 LTX STRL (WOUND CARE) IMPLANT
ELECT REM PT RETURN 9FT ADLT (ELECTROSURGICAL) ×3
ELECTRODE REM PT RTRN 9FT ADLT (ELECTROSURGICAL) ×2 IMPLANT
GOWN STRL REUS W/ TWL LRG LVL3 (GOWN DISPOSABLE) ×6 IMPLANT
GOWN STRL REUS W/TWL LRG LVL3 (GOWN DISPOSABLE) ×9
HEMOSTAT SPONGE AVITENE ULTRA (HEMOSTASIS) IMPLANT
KIT BASIN OR (CUSTOM PROCEDURE TRAY) ×3 IMPLANT
KIT TURNOVER KIT B (KITS) ×3 IMPLANT
LOOP VESSEL MINI RED (MISCELLANEOUS) ×2 IMPLANT
NS IRRIG 1000ML POUR BTL (IV SOLUTION) ×3 IMPLANT
PACK CV ACCESS (CUSTOM PROCEDURE TRAY) ×3 IMPLANT
PAD ARMBOARD 7.5X6 YLW CONV (MISCELLANEOUS) ×6 IMPLANT
SUT PROLENE 7 0 BV 1 (SUTURE) ×5 IMPLANT
SUT SILK 2 0 SH (SUTURE) IMPLANT
SUT SILK 3 0 (SUTURE) ×3
SUT SILK 3-0 18XBRD TIE 12 (SUTURE) ×1 IMPLANT
SUT VIC AB 2-0 SH 27 (SUTURE) ×3
SUT VIC AB 2-0 SH 27XBRD (SUTURE) ×1 IMPLANT
SUT VIC AB 3-0 SH 27 (SUTURE) ×9
SUT VIC AB 3-0 SH 27X BRD (SUTURE) ×4 IMPLANT
SUT VIC AB 4-0 PS2 27 (SUTURE) ×4 IMPLANT
SUT VICRYL 4-0 PS2 18IN ABS (SUTURE) ×3 IMPLANT
TOWEL GREEN STERILE (TOWEL DISPOSABLE) ×3 IMPLANT
UNDERPAD 30X36 HEAVY ABSORB (UNDERPADS AND DIAPERS) ×3 IMPLANT
WATER STERILE IRR 1000ML POUR (IV SOLUTION) ×3 IMPLANT

## 2020-05-29 NOTE — Interval H&P Note (Signed)
History and Physical Interval Note:  05/29/2020 8:44 AM  Chelsey Lee  has presented today for surgery, with the diagnosis of CHRONIC KIDNEY DISEASE STAGE IV.  The various methods of treatment have been discussed with the patient and family. After consideration of risks, benefits and other options for treatment, the patient has consented to  Procedure(s): LEFT SECOND STAGE [place] (Left) as a surgical intervention.  The patient's history has been reviewed, patient examined, no change in status, stable for surgery.  I have reviewed the patient's chart and labs.  Questions were answered to the patient's satisfaction.     Chelsey Lee

## 2020-05-29 NOTE — Discharge Instructions (Signed)
° °  [facility] [place] ° °Discharge Instructions ° °AV Fistula or Graft Surgery for Dialysis Access ° °Please refer to the following instructions for your post-procedure care. Your surgeon or physician assistant will discuss any changes with you. ° °Activity ° °You may drive the day following your surgery, if you are comfortable and no longer taking prescription pain medication. Resume full activity as the soreness in your incision resolves. ° °Bathing/Showering ° °You may shower after you go home. Keep your incision dry for 48 hours. Do not soak in a bathtub, hot tub, or swim until the incision heals completely. You may not shower if you have a hemodialysis catheter. ° °Incision Care ° °Clean your incision with mild soap and water after 48 hours. Pat the area dry with a clean towel. You do not need a bandage unless otherwise instructed. Do not apply any ointments or creams to your incision. You may have skin glue on your incision. Do not peel it off. It will come off on its own in about one week. Your arm may swell a bit after surgery. To reduce swelling use pillows to elevate your arm so it is above your heart. Your doctor will tell you if you need to lightly wrap your arm with an ACE bandage. ° °Diet ° °Resume your normal diet. There are not special food restrictions following this procedure. In order to heal from your surgery, it is CRITICAL to get adequate nutrition. Your body requires vitamins, minerals, and protein. Vegetables are the best source of vitamins and minerals. Vegetables also provide the perfect balance of protein. Processed food has little nutritional value, so try to avoid this. ° °Medications ° °Resume taking all of your medications. If your incision is causing pain, you may take over-the counter pain relievers such as acetaminophen (Tylenol). If you were prescribed a stronger pain medication, please be aware these medications can cause nausea and constipation. Prevent  nausea by taking the medication with a snack or meal. Avoid constipation by drinking plenty of fluids and eating foods with high amount of fiber, such as fruits, vegetables, and grains. Do not take Tylenol if you are taking prescription pain medications. ° ° ° ° °Follow up °Your surgeon may want to see you in the office following your access surgery. If so, this will be arranged at the time of your surgery. ° °Please call us immediately for any of the following conditions: ° °Increased pain, redness, drainage (pus) from your incision site °Fever of 101 degrees or higher °Severe or worsening pain at your incision site °Hand pain or numbness. ° °Reduce your risk of vascular disease: ° °Stop smoking. If you would like help, call [facility] at 1-800-QUIT-NOW (1-800-784-8669) or [place] at 336-586-4000 ° °Manage your cholesterol °Maintain a desired weight °Control your diabetes °Keep your blood pressure down ° °Dialysis ° °It will take several weeks to several months for your new dialysis access to be ready for use. Your surgeon will determine when it is OK to use it. Your nephrologist will continue to direct your dialysis. You can continue to use your Permcath until your new access is ready for use. ° °If you have any questions, please call the office at [phone]. ° °

## 2020-05-29 NOTE — Transfer of Care (Signed)
Immediate Anesthesia Transfer of Care Note  Patient: Chelsey Lee  Procedure(s) Performed: LEFT UPPER ARM FISTULA SUPERFICIALIZATION WITH LIGATION OF MULTIPLE SIDE BRANCHES (Left Arm Upper)  Patient Location: Endoscopy Unit  Anesthesia Type:General  Level of Consciousness: drowsy and patient cooperative  Airway & Oxygen Therapy: Patient Spontanous Breathing and Patient connected to face mask oxygen  Post-op Assessment: Report given to RN and Post -op Vital signs reviewed and stable  Post vital signs: Reviewed and stable  Last Vitals:  Vitals Value Taken Time  BP 158/93 05/29/20 1531  Temp 36.3 C 05/29/20 1530  Pulse 100 05/29/20 1533  Resp 17 05/29/20 1533  SpO2 100 % 05/29/20 1533  Vitals shown include unvalidated device data.  Last Pain:  Vitals:   05/29/20 0900  TempSrc:   PainSc: 0-No pain      Patients Stated Pain Goal: 3 (96/43/83 8184)  Complications: No complications documented.

## 2020-05-29 NOTE — Op Note (Signed)
Procedure: Superficialization multiple sidebranch ligation left brachiocephalic AV fistula  Reoperative diagnosis: Poorly maturing AV fistula  Postoperative diagnosis: Same  Anesthesia: General  Assistant: Arlee Muslim, PA-C for expediting procedure  Operative findings: 4 large sidebranches ligated  Operative details: After pain informed consent, the patient taken the operating.  The patient was placed in supine position operating table.  After induction general anesthesia placement of a laryngeal mask patient's left upper extremities prepped and draped in usual sterile fashion.  Ultrasound was used to identify the course of the fistula.  3 longitudinal incisions were made on the upper arm carried down through the subcutaneous tissues down the level of fistula.  The fistula was dissected free circumferentially for its full course.  There were several large sidebranches proximally for them which were ligated divided tween silk ties.  After the fistula had been completely mobilized from the antecubital space although up to the shoulder the subcutaneous fat tissue was debrided away from the skin edges.  Several tacking sutures were placed in the upper portion of the arm to bring the fistula up to the skin surface.  Hemostasis was obtained.  4-0 Vicryl subcuticular was used to close all the skin incisions.  Dermabond was applied.  Patient tolerated procedure well and there were no complications.  Instrument sponge needle counts correct the end of the case.  Patient was taken the recovery room stable condition.  Ruta Hinds, MD [facility] [place] Office: [phone]

## 2020-05-29 NOTE — Anesthesia Procedure Notes (Signed)
Procedure Name: LMA Insertion Date/Time: 05/29/2020 2:00 PM Performed by: Michele Rockers, CRNA Pre-anesthesia Checklist: Patient identified, Patient being monitored, Timeout performed, Emergency Drugs available and Suction available Patient Re-evaluated:Patient Re-evaluated prior to induction Oxygen Delivery Method: Circle System Utilized Preoxygenation: Pre-oxygenation with 100% oxygen Induction Type: IV induction Ventilation: Mask ventilation without difficulty LMA Size: 4.0 Placement Confirmation: positive ETCO2 and breath sounds checked- equal and bilateral Tube secured with: Tape Dental Injury: Teeth and Oropharynx as per pre-operative assessment

## 2020-05-29 NOTE — Anesthesia Postprocedure Evaluation (Signed)
Anesthesia Post Note  Patient: Chelsey Lee  Procedure(s) Performed: LEFT UPPER ARM FISTULA SUPERFICIALIZATION WITH LIGATION OF MULTIPLE SIDE BRANCHES (Left Arm Upper)     Patient location during evaluation: PACU Anesthesia Type: General Level of consciousness: awake and alert Pain management: pain level controlled Vital Signs Assessment: post-procedure vital signs reviewed and stable Respiratory status: spontaneous breathing, nonlabored ventilation, respiratory function stable and patient connected to nasal cannula oxygen Cardiovascular status: blood pressure returned to baseline and stable Postop Assessment: no apparent nausea or vomiting Anesthetic complications: no   No complications documented.  Last Vitals:  Vitals:   05/29/20 1600 05/29/20 1615  BP: (!) 156/72 (!) 158/67  Pulse: 84 84  Resp: (!) 25 19  Temp:  (!) 36.4 C  SpO2: 98% 96%    Last Pain:  Vitals:   05/29/20 1600  TempSrc:   PainSc: 9                  Steve Youngberg S

## 2020-05-29 NOTE — Anesthesia Preprocedure Evaluation (Addendum)
Anesthesia Evaluation  Patient identified by MRN, date of birth, ID band Patient awake    Reviewed: Allergy & Precautions, NPO status , Patient's Chart, lab work & pertinent test results  History of Anesthesia Complications Negative for: history of anesthetic complications  Airway Mallampati: II  TM Distance: >3 FB Neck ROM: Full    Dental no notable dental hx. (+) Dental Advisory Given, Teeth Intact   Pulmonary sleep apnea ,    Pulmonary exam normal breath sounds clear to auscultation       Cardiovascular hypertension, Pt. on medications and Pt. on home beta blockers Normal cardiovascular exam Rhythm:Regular Rate:Normal  TTE 2016: LVEF 60-65%, moderate LVH, normal wall motion, diastolic dysfunction, indeterminate LV filling pressure, moderate LAE, low normal RV function, dilated IVC    Neuro/Psych PSYCHIATRIC DISORDERS Depression negative neurological ROS     GI/Hepatic Neg liver ROS, GERD  Controlled,  Endo/Other  diabetes, Type 2, Insulin DependentMorbid obesity (BMI 47)  Renal/GU Renal InsufficiencyRenal disease  negative genitourinary   Musculoskeletal  (+) Arthritis ,   Abdominal (+) + obese,   Peds  Hematology  (+) anemia ,   Anesthesia Other Findings Day of surgery medications reviewed with patient.  Reproductive/Obstetrics negative OB ROS                            Anesthesia Physical Anesthesia Plan  ASA: IV  Anesthesia Plan: General   Post-op Pain Management:    Induction:   PONV Risk Score and Plan: 3 and Treatment may vary due to age or medical condition, Ondansetron and Dexamethasone  Airway Management Planned: LMA  Additional Equipment: None  Intra-op Plan:   Post-operative Plan: Extubation in OR  Informed Consent: I have reviewed the patients History and Physical, chart, labs and discussed the procedure including the risks, benefits and alternatives for the  proposed anesthesia with the patient or authorized representative who has indicated his/her understanding and acceptance.     Dental advisory given  Plan Discussed with: CRNA  Anesthesia Plan Comments: (Pt states she could hear talking with prior procedure and request GA. Discussed risks and she excepts. Plan GA c LMA)       Anesthesia Quick Evaluation

## 2020-05-30 ENCOUNTER — Other Ambulatory Visit (HOSPITAL_COMMUNITY): Payer: 59

## 2020-05-30 ENCOUNTER — Encounter (HOSPITAL_COMMUNITY): Payer: Self-pay | Admitting: Vascular Surgery

## 2020-06-05 ENCOUNTER — Ambulatory Visit: Payer: 59 | Admitting: Physician Assistant

## 2020-06-22 ENCOUNTER — Other Ambulatory Visit: Payer: Self-pay

## 2020-06-22 ENCOUNTER — Ambulatory Visit (INDEPENDENT_AMBULATORY_CARE_PROVIDER_SITE_OTHER): Payer: 59 | Admitting: Physician Assistant

## 2020-06-22 VITALS — BP 155/71 | HR 71 | Temp 98.3°F | Resp 20 | Ht 66.0 in | Wt 284.9 lb

## 2020-06-22 DIAGNOSIS — N184 Chronic kidney disease, stage 4 (severe): Secondary | ICD-10-CM

## 2020-06-22 NOTE — Progress Notes (Signed)
  POST OPERATIVE DIALYSIS ACCESS OFFICE NOTE    CC:  F/u for dialysis access surgery  HPI:  This is a 65 y.o. female who is s/p superficialization of left brachiocephalic AVF by Dr. Oneida Alar on May 29, 2020.  Her original procedure for creation of left brachiocephalic AV fistula was performed on April 10, 2020.  She denies complaints today of hand pain, hand numbness, incisional soreness, fever or chills.  She is not yet requiring hemodialysis  No Known Allergies  Current Outpatient Medications  Medication Sig Dispense Refill  . allopurinol (ZYLOPRIM) 100 MG tablet Take 1 tablet (100 mg total) by mouth daily. 60 tablet 3  . atorvastatin (LIPITOR) 20 MG tablet Take 20 mg by mouth daily.    Marland Kitchen buPROPion (WELLBUTRIN SR) 150 MG 12 hr tablet Take 150 mg by mouth 2 (two) times daily.    . carvedilol (COREG) 25 MG tablet Take 25 mg by mouth 2 (two) times daily with a meal.    . Cholecalciferol (VITAMIN D-3) 125 MCG (5000 UT) TABS Take 5,000 Units by mouth daily.    . Continuous Blood Gluc Sensor (FREESTYLE LIBRE 2 SENSOR) MISC     . cyclobenzaprine (FLEXERIL) 10 MG tablet Take 10 mg by mouth 2 (two) times daily.    Marland Kitchen escitalopram (LEXAPRO) 10 MG tablet Take 10 mg by mouth daily.    . fenofibrate 160 MG tablet Take 160 mg by mouth daily.    . ferrous sulfate 325 (65 FE) MG EC tablet Take 325 mg by mouth daily with breakfast.    . FREESTYLE LITE test strip SMARTSIG:Via Meter 2-3 Times Daily    . furosemide (LASIX) 80 MG tablet Take 80-120 mg by mouth See admin instructions. Take 120 mg by mouth in the morning and 80 mg in the afternoon    . glucose blood (ONETOUCH ULTRA) test strip     . HYDROcodone-acetaminophen (NORCO) 5-325 MG tablet Take 1 tablet by mouth every 6 (six) hours as needed for moderate pain. 25 tablet 0  . insulin NPH-regular Human (70-30) 100 UNIT/ML injection Inject 30-45 Units into the skin See admin instructions. Inject 45 units into the skin in the morning and 30 units at night     . liraglutide (VICTOZA) 18 MG/3ML SOPN Inject 1.8 mg into the skin daily.    . magnesium oxide (MAG-OX) 400 MG tablet Take 400 mg by mouth daily.     No current facility-administered medications for this visit.     ROS:  See HPI  Vitals:   06/22/20 0841  Weight: 284 lb 14.4 oz (129.2 kg)  Height: 5\' 6"  (1.676 m)    Physical Exam:  General appearance: awake, alert in NAD Respirations: unlabored; no dyspnea at rest Left upper extremity: Hand is warm with 5/5 grip strength. Motor function and sensation intact. Good bruit and thrill in fistula. 2+ radial pulse. Incision(s): Well approximated. No active bleeding or hematoma.   Assessment/Plan:   -pt does not have evidence of steal syndrome -the fistula may be used starting after July 11, 2020 if hemodialysis becomes necessary -follow-up PRN  Barbie Banner, PA-C 06/22/2020 8:36 AM [facility] [phone]  Clinic MD:  Dr. Oneida Alar

## 2020-06-30 ENCOUNTER — Ambulatory Visit: Payer: 59 | Admitting: Registered"

## 2020-08-31 ENCOUNTER — Ambulatory Visit (INDEPENDENT_AMBULATORY_CARE_PROVIDER_SITE_OTHER): Payer: 59 | Admitting: Orthopedic Surgery

## 2020-08-31 DIAGNOSIS — M25562 Pain in left knee: Secondary | ICD-10-CM | POA: Diagnosis not present

## 2020-08-31 DIAGNOSIS — G8929 Other chronic pain: Secondary | ICD-10-CM | POA: Diagnosis not present

## 2020-09-07 ENCOUNTER — Telehealth: Payer: Self-pay

## 2020-09-07 ENCOUNTER — Other Ambulatory Visit: Payer: Self-pay

## 2020-09-07 DIAGNOSIS — N184 Chronic kidney disease, stage 4 (severe): Secondary | ICD-10-CM

## 2020-09-07 NOTE — Telephone Encounter (Signed)
Patient calls today to report some transient numbness in her left hand when she grips things. It's been going on about 2 weeks. She does not have pain and is not yet scheduled to begin dialysis. At her post op appointment in June, patient had no steal symptoms, so I placed her on the the schedule next week for a steal study. Advised patient to call back sooner if further issues. She verbalized understanding.

## 2020-09-15 ENCOUNTER — Other Ambulatory Visit: Payer: Self-pay

## 2020-09-15 ENCOUNTER — Ambulatory Visit (INDEPENDENT_AMBULATORY_CARE_PROVIDER_SITE_OTHER)
Admission: RE | Admit: 2020-09-15 | Discharge: 2020-09-15 | Disposition: A | Discharge status: Home / Self Care | Payer: 59 | Source: Ambulatory Visit | Attending: Vascular Surgery | Admitting: Vascular Surgery

## 2020-09-15 ENCOUNTER — Encounter: Payer: Self-pay | Admitting: Registered"

## 2020-09-15 ENCOUNTER — Encounter: Payer: 59 | Attending: Nephrology | Admitting: Registered"

## 2020-09-15 ENCOUNTER — Ambulatory Visit (INDEPENDENT_AMBULATORY_CARE_PROVIDER_SITE_OTHER): Payer: 59 | Admitting: Physician Assistant

## 2020-09-15 VITALS — Wt 282.4 lb

## 2020-09-15 VITALS — BP 172/75 | HR 84 | Temp 98.1°F | Resp 20 | Ht 66.0 in | Wt 281.8 lb

## 2020-09-15 DIAGNOSIS — N184 Chronic kidney disease, stage 4 (severe): Secondary | ICD-10-CM

## 2020-09-15 DIAGNOSIS — E1121 Type 2 diabetes mellitus with diabetic nephropathy: Secondary | ICD-10-CM | POA: Insufficient documentation

## 2020-09-15 NOTE — Patient Instructions (Signed)
To prepare for your knee surgery recovery time consider trying out some of the [employer] kidney/diabetes friendly recipes given to you today. And then cook meals ahead of time and keep in your freezer. Consider trying out the armchair exercise. Let your neighbor know when you are out working in your garden.

## 2020-09-15 NOTE — Progress Notes (Signed)
Established Dialysis Access   History of Present Illness   Chelsey Lee is a 65 y.o. ([birth year]) female who presents for evaluation of left hand numbness. She has a left brachiocephalic AV fistula that was created in 03/21/20 and superficialized on 05/29/20 by Dr. Oneida Alar. She says that her hand started feeling numb 2 weeks ago. This only occurs when her elbow is bent like when she is talking on the phone. She says if she switches arms it resolves. She otherwise denies any weakness, coldness, pain, of color changes.   She is not yet on Hemodialysis. Her Nephrologist is Dr. Carolin Sicks  The patient's PMH, [place], [place], and FamHx were reviewed and are unchanged from her last visit on 06/22/20.  Current Outpatient Medications  Medication Sig Dispense Refill   allopurinol (ZYLOPRIM) 100 MG tablet Take 1 tablet (100 mg total) by mouth daily. 60 tablet 3   atorvastatin (LIPITOR) 20 MG tablet Take 20 mg by mouth daily.     buPROPion (WELLBUTRIN SR) 150 MG 12 hr tablet Take 150 mg by mouth 2 (two) times daily.     carvedilol (COREG) 25 MG tablet Take 25 mg by mouth 2 (two) times daily with a meal.     Cholecalciferol (VITAMIN D-3) 125 MCG (5000 UT) TABS Take 5,000 Units by mouth daily.     Continuous Blood Gluc Sensor (FREESTYLE LIBRE 2 SENSOR) MISC      cyclobenzaprine (FLEXERIL) 10 MG tablet Take 10 mg by mouth 2 (two) times daily.     escitalopram (LEXAPRO) 10 MG tablet Take 10 mg by mouth daily.     fenofibrate 160 MG tablet Take 160 mg by mouth daily.     ferrous sulfate 325 (65 FE) MG EC tablet Take 325 mg by mouth daily with breakfast.     FREESTYLE LITE test strip SMARTSIG:Via Meter 2-3 Times Daily     furosemide (LASIX) 80 MG tablet Take 80-120 mg by mouth See admin instructions. Take 120 mg by mouth in the morning and 80 mg in the afternoon     glucose blood (ONETOUCH ULTRA) test strip      HYDROcodone-acetaminophen (NORCO) 5-325 MG tablet Take 1 tablet by mouth every 6 (six) hours as needed  for moderate pain. 25 tablet 0   insulin NPH-regular Human (70-30) 100 UNIT/ML injection Inject 30-45 Units into the skin See admin instructions. Inject 45 units into the skin in the morning and 30 units at night     liraglutide (VICTOZA) 18 MG/3ML SOPN Inject 1.8 mg into the skin daily.     magnesium oxide (MAG-OX) 400 MG tablet Take 400 mg by mouth daily.     Molnupiravir 200 MG CAPS 4 capsules     No current facility-administered medications for this visit.    On ROS today: negative unless stated in HPI   Physical Examination   Vitals:   09/15/20 1348  BP: (!) 172/75  Pulse: 84  Resp: 20  Temp: 98.1 F (36.7 C)  TempSrc: Temporal  SpO2: 99%  Weight: 281 lb 12.8 oz (127.8 kg)  Height: 5\' 6"  (1.676 m)   Body mass index is 45.48 kg/m.  General: WDWN, in no acute distress Lungs: non labored Cardiac: regular Extremities: left upper extremity fistula with good thrill and bruit. 2+ radial pulse and ulnar pulse. Left hand warm. 5/5 grip strength  Non-invasive Vascular Imaging   left Arm Access Duplex  (09/15/20):  Findings:    +---------------------------+--------+--------+--------+  Right   Left    Comments  +---------------------------+--------+--------+--------+  Brachial                                             +---------------------------+--------+--------+--------+  Radial Ambient                                       +---------------------------+--------+--------+--------+  Radial AV Compression                                +---------------------------+--------+--------+--------+  Ulnar Ambient                                        +---------------------------+--------+--------+--------+  Ulnar AV Compression                                 +---------------------------+--------+--------+--------+  2nd Digit Ambient          228 mmHg119 mmHg           +---------------------------+--------+--------+--------+  2nd Digit Ulnar Compression        204 mmHg          +---------------------------+--------+--------+--------+   Summary:  Left digit pressure increased by 85 mmHg with AVF compression, suggesting Steal syndrome.    Medical Decision Making   Chelsey Lee is a 65 y.o. female who presents with left hand numbness.She has a left brachiocephalic AV fistula that was created in 03/21/20 and superficialized on 05/29/20 by Dr. Oneida Alar. Duplex today shows some steal however I discussed with patient that by nature of a fistula they steal blood flow from the hand. Clinically she has great perfusion of her left upper arm. She has palpable radial and ulnar pulses. Her hand is warm and well perfused. Her symptoms only occur with flexion of  her upper arm and is likely due to compression of her nerves. I have recommended that she try to be cognoscente of the positioning of her arm and try not to have it bent for prolonged periods of time - she will follow up with her Nephrologist regarding timing of initiation of dialysis  - She will follow up as needed or if she has new or worsening symptoms   Karoline Caldwell, PA-C [facility] Office: [facility] MD: Donzetta Matters

## 2020-09-15 NOTE — Progress Notes (Signed)
Diabetes Self-Management Education  Visit Type: Follow-up  Appt. Start Time: 1015 Appt. End Time: 1055  05/18/2020  Ms. Derek Jack, identified by name and date of birth, is a 65 y.o. female with a diagnosis of Diabetes:  .   ASSESSMENT  Height 5\' 6"  (1.676 m), weight 292 lb 1.6 oz (132.5 kg). Body mass index is 47.15 kg/m.   Problem list:  Gout, GERD, T2DM (NPH insulin bid 45 u am/ 30 u pm);  CKD stage 4, fistula placed for dialysis Dr. Forde Dandy added Celiac disease to problem list 08/21/2016. No notes or labs specific to diagnosis in chart notes. LVM for Dr Roland Earl nurse to verify diagnosis.  A1c is monitored by Dr. Forde Dandy, patient does not remember what last A1c was. Medication: Insulin 70/30; Victoza   SMG: Patient does not wrtie down because Dr. Baldwin Crown office can upload data from meter. Pt reports FBS 133 today. Before lunch once was 183 mg/dL, but usually around 100-130s. HS the other day was high at 200 mg/dL  Patient gave one example breakfast: 2 slices bacon, egg whites, 2 pieces of toast  Physical Activity: hasn't gone to the [facility] since [place]. Walks down the drive way to get newspaper, has garden, was growing cucumbers. Pt states she is anticipating having knee replacement surgery on both knees soon. Pt states her sister is going to be helping her out and wants to have easy foods for her sister to be able to give to her.  Reviewed labels and diet considerations for sodium and carbohydrates. Also discussed amount of protein at each meal.   Due to patient anticipating dialysis estimated protein needs 72-82 g based on IBW: 59 kg; adjusted body weight 87 kg  Education material provided: Recipes from [employer] for diabetes & kidney-friendly meals. Armchair exercises  If problems or questions, patient to contact team via:  Phone  Future DSME appointment: 1-2 months (patient to call back and schedule)

## 2020-09-18 ENCOUNTER — Encounter: Payer: Self-pay | Admitting: Orthopedic Surgery

## 2020-09-18 DIAGNOSIS — G8929 Other chronic pain: Secondary | ICD-10-CM

## 2020-09-18 DIAGNOSIS — M25562 Pain in left knee: Secondary | ICD-10-CM | POA: Diagnosis not present

## 2020-09-18 MED ORDER — METHYLPREDNISOLONE ACETATE 40 MG/ML IJ SUSP
40.0000 mg | INTRAMUSCULAR | Status: AC | PRN
  Administered 2020-09-18: 40 mg via INTRA_ARTICULAR

## 2020-09-18 MED ORDER — LIDOCAINE HCL (PF) 1 % IJ SOLN
5.0000 mL | INTRAMUSCULAR | Status: AC | PRN
  Administered 2020-09-18: 5 mL

## 2020-09-18 NOTE — Progress Notes (Signed)
Office Visit Note   Patient: Chelsey Lee           Date of Birth: 02-Apr-1955           MRN: [id number] Visit Date: 08/31/2020              Requested by: Willey Blade, [address] [place],  [place] [id number] PCP: Willey Blade, MD  No chief complaint on file.     HPI: Patient is a 65 year old woman who presents in follow-up for osteoarthritis of her left knee.  Patient had good relief from her injection 4 months ago.  She states the pain has reoccurred.  Assessment & Plan: Visit Diagnoses:  1. Chronic pain of left knee     Plan: Knee was injected she tolerated this well.  Follow-Up Instructions: Return if symptoms worsen or fail to improve.   Ortho Exam  Patient is alert, oriented, no adenopathy, well-dressed, normal affect, normal respiratory effort. Examination patient has an antalgic gait she has valgus alignment to her left knee with ambulation she does have a mild effusion she has crepitation in the patellofemoral joint with range of motion collaterals and cruciates are stable medial lateral joint line are tender to palpation.  Imaging: No results found. No images are attached to the encounter.  Labs: Lab Results  Component Value Date   LABURIC 8.9 (H) 03/06/2020     Lab Results  Component Value Date   ALBUMIN 3.1 (L) 07/20/2015    No results found for: MG No results found for: VD25OH  No results found for: PREALBUMIN CBC EXTENDED Latest Ref Rng & Units 05/29/2020 04/10/2020 07/20/2015  WBC 4.0 - 10.5 K/uL - - 5.2  RBC 3.87 - 5.11 MIL/uL - - 2.92(L)  HGB 12.0 - 15.0 g/dL 11.6(L) 10.9(L) 8.3(L)  HCT 36.0 - 46.0 % 34.0(L) 32.0(L) 25.9(L)  PLT 150 - 400 K/uL - - 134(L)  NEUTROABS 1.7 - 7.7 K/uL - - 3.0  LYMPHSABS 0.7 - 4.0 K/uL - - 1.5     There is no height or weight on file to calculate BMI.  Orders:  No orders of the defined types were placed in this encounter.  No orders of the defined types were placed in this  encounter.    Procedures: Large Joint Inj: L knee on 09/18/2020 12:50 PM Indications: pain and diagnostic evaluation Details: 22 G 1.5 in needle, anteromedial approach  Arthrogram: No  Medications: 5 mL lidocaine (PF) 1 %; 40 mg methylPREDNISolone acetate 40 MG/ML Outcome: tolerated well, no immediate complications Procedure, treatment alternatives, risks and benefits explained, specific risks discussed. Consent was given by the patient. Immediately prior to procedure a time out was called to verify the correct patient, procedure, equipment, support staff and site/side marked as required. Patient was prepped and draped in the usual sterile fashion.     Clinical Data: No additional findings.  ROS:  All other systems negative, except as noted in the HPI. Review of Systems  Objective: Vital Signs: There were no vitals taken for this visit.  Specialty Comments:  No specialty comments available.  PMFS History: Patient Active Problem List   Diagnosis Date Noted   Cerebrovascular disease 05/11/2020   Gout 03/09/2020   Allergic rhinitis 01/25/2020   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   Colon cancer screening 01/25/2020   Gastroesophageal reflux disease without esophagitis 01/25/2020   Hyperlipidemia 01/25/2020   Morbid obesity ([place]) 01/25/2020   Personal history of colonic polyps 01/25/2020   Sleep  apnea 01/25/2020   Vitamin D deficiency 01/25/2020   Controlled type 2 diabetes mellitus without complication ([place]) 76/72/0947   Chronic kidney disease, stage 4 (severe) ([place]) 06/29/2019   Long term (current) use of insulin ([place]) 06/29/2019   Epidural lipomatosis 03/02/2019   Spondylolisthesis of lumbar region 03/02/2019   Spinal stenosis of lumbar region with neurogenic claudication 03/02/2019   Left wrist tendinitis 09/07/2018   Localized osteoarthritis of left knee 04/03/2018   Celiac disease 08/21/2016   Abnormal thyroid function test 04/01/2016   Pain in thumb  joint with movement of right hand 09/14/2015   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 No pertinent family history.  Past Surgical History:  Procedure Laterality Date   AV FISTULA PLACEMENT Left 04/10/2020   Procedure: LEFT ARM ARTERIOVENOUS (AV) FISTULA CREATION;  Surgeon: Elam Dutch, MD;  Location: [facility];  Service: Vascular;  Laterality: Left;   CAPSULOTOMY  01/13/2012   Procedure: MINOR CAPSULOTOMY;  Surgeon: Myrtha Mantis., MD;  Location: [facility];  Service: Ophthalmology;  Laterality: Left;   DORSAL COMPARTMENT RELEASE  2009   rt    FISTULA SUPERFICIALIZATION Left 05/29/2020   Procedure: LEFT UPPER ARM FISTULA SUPERFICIALIZATION WITH LIGATION OF MULTIPLE SIDE BRANCHES;  Surgeon: Elam Dutch, MD;  Location: [facility];  Service: Vascular;  Laterality: Left;   KNEE ARTHROSCOPY  02,04   right and left   STERIOD INJECTION  08/01/2011   Procedure: STEROID INJECTION;  Surgeon: Cammie Sickle., MD;  Location: [facility];  Service: Orthopedics;  Laterality: Left;  cmc left thumb   TRIGGER FINGER RELEASE  2006   rt thumb   TRIGGER FINGER RELEASE  08/01/2011   Procedure: RELEASE TRIGGER FINGER/A-1 PULLEY;  Surgeon: Cammie Sickle., MD;  Location: [facility];  Service: Orthopedics;  Laterality: Left;  left thumb   YAG LASER APPLICATION  09/62/8366   Procedure: YAG LASER APPLICATION;  Surgeon: Myrtha Mantis., MD;  Location: [facility];  Service: Ophthalmology;  Laterality: Left;   Social History   Occupational History   Not on file  Tobacco Use    Smoking status: Never   Smokeless tobacco: Never  Vaping Use   Vaping Use: Never used  Substance and Sexual Activity   Alcohol use: No   Drug use: No   Sexual activity: Not on file

## 2020-10-10 ENCOUNTER — Other Ambulatory Visit: Payer: Self-pay | Admitting: Family

## 2020-10-10 MED ORDER — ALLOPURINOL 100 MG PO TABS: 100.0000 mg | ORAL_TABLET | Freq: Every day | ORAL | 3 refills | Status: AC

## 2020-11-14 ENCOUNTER — Emergency Department (HOSPITAL_COMMUNITY)
Admission: EM | Admit: 2020-11-14 | Discharge: 2020-11-14 | Disposition: A | Discharge status: Home / Self Care | Payer: 59 | Attending: Emergency Medicine | Admitting: Emergency Medicine

## 2020-11-14 ENCOUNTER — Emergency Department (HOSPITAL_COMMUNITY): Payer: 59

## 2020-11-14 ENCOUNTER — Encounter (HOSPITAL_COMMUNITY): Payer: Self-pay

## 2020-11-14 DIAGNOSIS — Y92410 Unspecified street and highway as the place of occurrence of the external cause: Secondary | ICD-10-CM | POA: Diagnosis not present

## 2020-11-14 DIAGNOSIS — S60221A Contusion of right hand, initial encounter: Secondary | ICD-10-CM | POA: Insufficient documentation

## 2020-11-14 DIAGNOSIS — S8002XA Contusion of left knee, initial encounter: Secondary | ICD-10-CM | POA: Insufficient documentation

## 2020-11-14 DIAGNOSIS — Z794 Long term (current) use of insulin: Secondary | ICD-10-CM | POA: Insufficient documentation

## 2020-11-14 DIAGNOSIS — S7002XA Contusion of left hip, initial encounter: Secondary | ICD-10-CM | POA: Diagnosis not present

## 2020-11-14 DIAGNOSIS — N189 Chronic kidney disease, unspecified: Secondary | ICD-10-CM

## 2020-11-14 DIAGNOSIS — E1122 Type 2 diabetes mellitus with diabetic chronic kidney disease: Secondary | ICD-10-CM | POA: Diagnosis not present

## 2020-11-14 DIAGNOSIS — Z79899 Other long term (current) drug therapy: Secondary | ICD-10-CM | POA: Insufficient documentation

## 2020-11-14 DIAGNOSIS — N184 Chronic kidney disease, stage 4 (severe): Secondary | ICD-10-CM | POA: Diagnosis not present

## 2020-11-14 DIAGNOSIS — I129 Hypertensive chronic kidney disease with stage 1 through stage 4 chronic kidney disease, or unspecified chronic kidney disease: Secondary | ICD-10-CM | POA: Insufficient documentation

## 2020-11-14 DIAGNOSIS — S6991XA Unspecified injury of right wrist, hand and finger(s), initial encounter: Secondary | ICD-10-CM | POA: Diagnosis present

## 2020-11-14 DIAGNOSIS — D649 Anemia, unspecified: Secondary | ICD-10-CM | POA: Insufficient documentation

## 2020-11-14 LAB — FOLATE: Folate: 12.4 ng/mL (ref >5.9)

## 2020-11-14 LAB — CBC WITH DIFFERENTIAL/PLATELET
Abs Immature Granulocytes: 0.02 10*3/uL (ref 0.00–0.07)
Basophils Absolute: 0 10*3/uL (ref 0.0–0.1)
Basophils Relative: 0 %
Eosinophils Absolute: 0.1 10*3/uL (ref 0.0–0.5)
Eosinophils Relative: 2 %
HCT: 31.9 % — ABNORMAL LOW (ref 36.0–46.0)
Hemoglobin: 10 g/dL — ABNORMAL LOW (ref 12.0–15.0)
Immature Granulocytes: 0 %
Lymphocytes Relative: 23 %
Lymphs Abs: 1.3 10*3/uL (ref 0.7–4.0)
MCH: 28.9 pg (ref 26.0–34.0)
MCHC: 31.3 g/dL (ref 30.0–36.0)
MCV: 92.2 fL (ref 80.0–100.0)
Monocytes Absolute: 0.7 10*3/uL (ref 0.1–1.0)
Monocytes Relative: 13 %
Neutro Abs: 3.3 10*3/uL (ref 1.7–7.7)
Neutrophils Relative %: 62 %
Platelets: 206 10*3/uL (ref 150–400)
RBC: 3.46 MIL/uL — ABNORMAL LOW (ref 3.87–5.11)
RDW: 15.6 % — ABNORMAL HIGH (ref 11.5–15.5)
WBC: 5.4 10*3/uL (ref 4.0–10.5)
nRBC: 0 % (ref 0.0–0.2)

## 2020-11-14 LAB — BASIC METABOLIC PANEL
Anion gap: 7 (ref 5–15)
BUN: 88 mg/dL — ABNORMAL HIGH (ref 8–23)
CO2: 24 mmol/L (ref 22–32)
Calcium: 9.6 mg/dL (ref 8.9–10.3)
Chloride: 109 mmol/L (ref 98–111)
Creatinine, Ser: 4.19 mg/dL — ABNORMAL HIGH (ref 0.44–1.00)
GFR, Estimated: 11 mL/min — ABNORMAL LOW (ref >60)
Glucose, Bld: 166 mg/dL — ABNORMAL HIGH (ref 70–99)
Potassium: 4.8 mmol/L (ref 3.5–5.1)
Sodium: 140 mmol/L (ref 135–145)

## 2020-11-14 LAB — FERRITIN: Ferritin: 316 ng/mL — ABNORMAL HIGH (ref 11–307)

## 2020-11-14 LAB — IRON AND TIBC
Iron: 50 ug/dL (ref 28–170)
Saturation Ratios: 14 % (ref 10.4–31.8)
TIBC: 370 ug/dL (ref 250–450)
UIBC: 320 ug/dL

## 2020-11-14 LAB — RETICULOCYTES
Immature Retic Fract: 14.2 % (ref 2.3–15.9)
RBC.: 3.45 MIL/uL — ABNORMAL LOW (ref 3.87–5.11)
Retic Count, Absolute: 63.5 10*3/uL (ref 19.0–186.0)
Retic Ct Pct: 1.8 % (ref 0.4–3.1)

## 2020-11-14 LAB — VITAMIN B12: Vitamin B-12: 823 pg/mL (ref 180–914)
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
## 2020-11-23 ENCOUNTER — Other Ambulatory Visit: Payer: Self-pay

## 2020-11-23 ENCOUNTER — Non-Acute Institutional Stay (HOSPITAL_COMMUNITY)
Admission: RE | Admit: 2020-11-23 | Discharge: 2020-11-23 | Disposition: A | Discharge status: Home / Self Care | Payer: 59 | Source: Ambulatory Visit | Attending: Internal Medicine | Admitting: Internal Medicine

## 2020-11-23 DIAGNOSIS — D631 Anemia in chronic kidney disease: Secondary | ICD-10-CM | POA: Diagnosis not present

## 2020-11-23 DIAGNOSIS — N189 Chronic kidney disease, unspecified: Secondary | ICD-10-CM | POA: Insufficient documentation

## 2020-11-23 MED ORDER — SODIUM CHLORIDE 0.9 % IV SOLN
750.0000 mg | Freq: Once | INTRAVENOUS | Status: AC
  Administered 2020-11-23: 750 mg via INTRAVENOUS
  Filled 2020-11-23: qty 15

## 2020-11-23 MED ORDER — SODIUM CHLORIDE 0.9 % IV SOLN: INTRAVENOUS | Status: DC | PRN

## 2020-11-23 NOTE — Progress Notes (Signed)
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 Tolerated well with no adverse reaction. Observed patient for 30 minutes post infusion. Vital signs stable. Discharge instructions given. Patient alert, oriented and ambulatory with cane at discharge.

## 2020-12-26 ENCOUNTER — Other Ambulatory Visit: Payer: Self-pay | Admitting: Nephrology

## 2020-12-26 ENCOUNTER — Ambulatory Visit
Admission: RE | Admit: 2020-12-26 | Discharge: 2020-12-26 | Disposition: A | Discharge status: Home / Self Care | Payer: 59 | Source: Ambulatory Visit | Attending: Nephrology | Admitting: Nephrology

## 2020-12-26 ENCOUNTER — Other Ambulatory Visit: Payer: Self-pay

## 2020-12-26 DIAGNOSIS — N189 Chronic kidney disease, unspecified: Secondary | ICD-10-CM

## 2021-01-10 ENCOUNTER — Other Ambulatory Visit: Payer: Self-pay | Admitting: Nephrology

## 2021-01-10 ENCOUNTER — Ambulatory Visit
Admission: RE | Admit: 2021-01-10 | Discharge: 2021-01-10 | Disposition: A | Discharge status: Home / Self Care | Payer: 59 | Source: Ambulatory Visit | Attending: Nephrology | Admitting: Nephrology

## 2021-01-10 ENCOUNTER — Other Ambulatory Visit: Payer: Self-pay

## 2021-01-10 DIAGNOSIS — N189 Chronic kidney disease, unspecified: Secondary | ICD-10-CM

## 2021-04-19 ENCOUNTER — Institutional Professional Consult (permissible substitution): Payer: Self-pay | Admitting: Internal Medicine

## 2021-04-23 ENCOUNTER — Institutional Professional Consult (permissible substitution): Payer: Self-pay | Admitting: Internal Medicine

## 2021-04-30 ENCOUNTER — Ambulatory Visit (INDEPENDENT_AMBULATORY_CARE_PROVIDER_SITE_OTHER): Payer: Self-pay | Admitting: Orthopedic Surgery

## 2021-04-30 ENCOUNTER — Telehealth: Payer: Self-pay | Admitting: Orthopedic Surgery

## 2021-04-30 DIAGNOSIS — M1712 Unilateral primary osteoarthritis, left knee: Secondary | ICD-10-CM

## 2021-04-30 NOTE — Telephone Encounter (Signed)
Pt's sister Valerie Salts called requesting a call back concerning pt's visit today. She stated pt in for consultation for surgery and she want to make sure all info given to pt is accurate. Please call [name] at [phone]. ?

## 2021-05-01 ENCOUNTER — Encounter: Payer: Self-pay | Admitting: Orthopedic Surgery

## 2021-05-01 NOTE — Progress Notes (Signed)
? ?Office Visit Note ?  ?Patient: Chelsey Lee           ?Date of Birth: 03-15-1955           ?MRN: [id number] ?Visit Date: 04/30/2021 ?             ?Requested by: Willey Blade, MD ?[address] ?[address] ?[place],  [place] [address] ?PCP: Willey Blade, MD ? ?Chief Complaint  ?Patient presents with  ? Left Knee - Pain  ? ? ? ? ?HPI: ?Patient is a 66 year old woman who presents in follow-up for osteoarthritis of her left knee she also states she has been having some left foot swelling.  She does wear a compression sock.  Patient states that her BMI was 45 last year when she weighed 281 pounds.  She is on dialysis Tuesday Thursday Saturday and has a history of gout. ? ?Assessment & Plan: ?Visit Diagnoses:  ?1. Primary osteoarthritis of left knee   ? ? ?Plan: Patient states she would like to proceed with a total knee arthroplasty.  Discussed the importance of getting her weight down to a BMI below 40.  Would need to set up dialysis postoperative day 1 in the hospital.  Risk and benefits were discussed including infection neurovascular injury persistent pain need for additional surgery.  Patient states she understands and wishes to proceed.  Continue with the knee-high compression socks. ? ?Follow-Up Instructions: Return in about 2 weeks (around 05/14/2021).  ? ?Ortho Exam ? ?Patient is alert, oriented, no adenopathy, well-dressed, normal affect, normal respiratory effort. ?Examination patient has an antalgic gait.  She has a strong dorsalis pedis pulse no pain with dorsiflexion the ankle the calf is soft nontender no evidence of a DVT there are no open venous ulcers no drainage.  Patient has pain with range of motion of the left knee.  2 view radiographs of the left knee shows advanced arthritic changes with osteophytic bone spurs subcondylar cysts and sclerosis with valgus deformity loose bodies in the popliteal fossa calcification of the popliteal arteries. ? ?Imaging: ?No results found. ?No images are  attached to the encounter. ? ?Labs: ?Lab Results  ?Component Value Date  ? LABURIC 8.9 (H) 03/06/2020  ? ? ? ?Lab Results  ?Component Value Date  ? ALBUMIN 3.1 (L) 07/20/2015  ? ? ?No results found for: MG ?No results found for: VD25OH ? ?No results found for: PREALBUMIN ? ?  Latest Ref Rng & Units 11/14/2020  ?  3:56 PM 05/29/2020  ?  9:13 AM 04/10/2020  ?  9:51 AM  ?CBC EXTENDED  ?WBC 4.0 - 10.5 K/uL 5.4      ?RBC 3.87 - 5.11 MIL/uL 3.45    ? 3.46      ?Hemoglobin 12.0 - 15.0 g/dL 10.0   11.6   10.9    ?HCT 36.0 - 46.0 % 31.9   34.0   32.0    ?Platelets 150 - 400 K/uL 206      ?NEUT# 1.7 - 7.7 K/uL 3.3      ?Lymph# 0.7 - 4.0 K/uL 1.3      ? ? ? ?There is no height or weight on file to calculate BMI. ? ?Orders:  ?No orders of the defined types were placed in this encounter. ? ?No orders of the defined types were placed in this encounter. ? ? ? Procedures: ?No procedures performed ? ?Clinical Data: ?No additional findings. ? ?ROS: ? ?All other systems negative, except as noted in the HPI. ?Review  of Systems ? ?Objective: ?Vital Signs: There were no vitals taken for this visit. ? ?Specialty Comments:  ?No specialty comments available. ? ?PMFS History: ?Patient Active Problem List  ? Diagnosis Date Noted  ? Cerebrovascular disease 05/11/2020  ? Gout 03/09/2020  ? Allergic rhinitis 01/25/2020  ? 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  ? Colon cancer screening 01/25/2020  ? Gastroesophageal reflux disease without esophagitis 01/25/2020  ? Hyperlipidemia 01/25/2020  ? Morbid obesity ([place]) 01/25/2020  ? Personal history of colonic polyps 01/25/2020  ? Sleep apnea 01/25/2020  ? Vitamin D deficiency 01/25/2020  ? Controlled type 2 diabetes mellitus without complication ([place]) 71/06/2692  ? Chronic kidney disease, stage 4 (severe) ([place]) 06/29/2019  ? Long term (current) use of insulin ([place]) 06/29/2019  ? Epidural lipomatosis 03/02/2019  ? Spondylolisthesis of lumbar region 03/02/2019  ? Spinal stenosis of lumbar region  with neurogenic claudication 03/02/2019  ? Left wrist tendinitis 09/07/2018  ? Localized osteoarthritis of left knee 04/03/2018  ? Celiac disease 08/21/2016  ? Abnormal thyroid function test 04/01/2016  ? Pain in thumb joint with movement of right hand 09/14/2015  ? 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  ? Moderate major depression, single episode ([place]) 02/02/2010  ? 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 Anemia 01/18/2009  ? Essential hypertension 01/18/2009  ? ?Past Medical History:  ?Diagnosis Date  ? Arthritis   ? Chronic kidney disease   ? stage 4  ? Diabetes mellitus   ? GERD (gastroesophageal reflux disease)   ? Hyperlipemia   ? Hypertension   ? Seasonal allergies   ? Sleep apnea   ?  ?History reviewed. No pertinent family history.  ?Past Surgical History:  ?Procedure Laterality Date  ? AV FISTULA PLACEMENT Left 04/10/2020  ? Procedure: LEFT ARM ARTERIOVENOUS (AV) FISTULA CREATION;  Surgeon: Elam Dutch, MD;  Location: [facility];  Service: Vascular;  Laterality: Left;  ? CAPSULOTOMY  01/13/2012  ? Procedure: MINOR CAPSULOTOMY;  Surgeon: Myrtha Mantis., MD;  Location: [facility];  Service: Ophthalmology;  Laterality: Left;  ? DORSAL COMPARTMENT RELEASE  2009  ? rt   ? FISTULA SUPERFICIALIZATION Left 05/29/2020  ? Procedure: LEFT UPPER ARM FISTULA SUPERFICIALIZATION WITH LIGATION OF MULTIPLE SIDE BRANCHES;  Surgeon: Elam Dutch, MD;  Location: [facility];  Service: Vascular;  Laterality: Left;  ? KNEE ARTHROSCOPY  02,04  ? right and left  ? STERIOD INJECTION  08/01/2011  ? Procedure: STEROID INJECTION;  Surgeon: Cammie Sickle., MD;  Location: [facility];  Service: Orthopedics;  Laterality: Left;  cmc left thumb  ? TRIGGER FINGER RELEASE  2006  ? rt thumb  ? TRIGGER FINGER RELEASE  08/01/2011  ? Procedure: RELEASE TRIGGER FINGER/A-1 PULLEY;  Surgeon: Cammie Sickle., MD;  Location: [facility];  Service: Orthopedics;  Laterality: Left;  left thumb  ? YAG LASER APPLICATION   85/46/2703  ? Procedure: YAG LASER APPLICATION;  Surgeon: Myrtha Mantis., MD;  Location: [facility];  Service: Ophthalmology;  Laterality: Left;  ? ?Social History  ? ?Occupational History  ? Not on file  ?Tobacco Use  ? Smoking status: Never  ? Smokeless tobacco: Never  ?Vaping Use  ? Vaping Use: Never used  ?Substance and Sexual Activity  ? Alcohol use: No  ? Drug use: No  ? Sexual activity: Not on file  ? ? ? ? ? ?

## 2021-05-07 NOTE — Telephone Encounter (Signed)
I do not have authorization to speak to sister due to her not being on pt's DPR ?

## 2021-05-09 NOTE — Progress Notes (Addendum)
05/11/21- 65 yoF never smoker for sleep evaluation courtesy of Dr Heath Gold with concern of OSA Medical problem list includes HTN, Cerebrovascular Disease, Allergic Rhinitis, Celiac Disease, GERD, DM2, Goiter, Epidural Lipomatosis, CKD4, Anemia, Gout, Osteoarthritis,  NPSG 04/04/15 AHI 24.4/ hr, desaturation to 77%, Body weight 278 lbs CPAP  [facility] Epworth score-7 Body weight today- Covid vax-3 Phizer Flu vax-had ------Patient is currently wearing CPAP and is here to establish care. Feels like she is doing good.  Here with sister. Uses CPAP every night.  She is not sure if it changes the way she feels but she is aware it prevents snoring.  Finds it comfortable but would like better mask fit. This is her original machine now 66 years old. ENT surgery for tonsils.  Now on dialysis 3 days/week.  Sleepy sometimes during dialysis but does not notice a change in her sleep pattern immediately before or immediately after her dialysis day.  No sleep medications and no caffeine. Bedtime around midnight, up at 8 AM. CXR 01/10/21- IMPRESSION: Very mild bibasilar atelectasis.  07/05/21- Addendum- Preop Risk Assessment Asked for preop clearance for Knee surgery. Based on exam 05/11/21, patient considered to be stable, at moderate risk for complications of surgery and GOT due to multiple co-morbidities. No acute issues or readily correctable respiratory problems identified for which presurgical intervention would improve risk of complication.  Prior to Admission medications   Medication Sig Start Date End Date Taking? Authorizing Provider  allopurinol (ZYLOPRIM) 100 MG tablet Take 1 tablet (100 mg total) by mouth daily. 10/10/20  Yes Dondra Prader R, NP  atorvastatin (LIPITOR) 20 MG tablet Take 20 mg by mouth daily.   Yes [provider]  buPROPion (WELLBUTRIN SR) 150 MG 12 hr tablet Take 150 mg by mouth 2 (two) times daily. 02/18/20  Yes [provider]  carvedilol (COREG) 25 MG tablet Take  25 mg by mouth 2 (two) times daily with a meal.   Yes [provider]  Cholecalciferol (VITAMIN D-3) 125 MCG (5000 UT) TABS Take 5,000 Units by mouth daily.   Yes [provider]  Continuous Blood Gluc Sensor (FREESTYLE LIBRE 2 SENSOR) MISC  05/12/20  Yes [provider]  cyclobenzaprine (FLEXERIL) 10 MG tablet Take 10 mg by mouth 2 (two) times daily.   Yes [provider]  escitalopram (LEXAPRO) 10 MG tablet Take 10 mg by mouth daily.   Yes [provider]  fenofibrate 160 MG tablet Take 160 mg by mouth daily.   Yes [provider]  ferrous sulfate 325 (65 FE) MG EC tablet Take 325 mg by mouth daily with breakfast.   Yes [provider]  FREESTYLE LITE test strip SMARTSIG:Via Meter 2-3 Times Daily 03/29/20  Yes [provider]  furosemide (LASIX) 80 MG tablet Take 80-120 mg by mouth See admin instructions. Take 120 mg by mouth in the morning and 80 mg in the afternoon   Yes [provider]  glucose blood (ONETOUCH ULTRA) test strip  02/01/19  Yes [provider]  HYDROcodone-acetaminophen (NORCO) 5-325 MG tablet Take 1 tablet by mouth every 6 (six) hours as needed for moderate pain. 05/29/20  Yes Dagoberto Ligas, PA-C  insulin NPH-regular Human (70-30) 100 UNIT/ML injection Inject 30-45 Units into the skin See admin instructions. Inject 45 units into the skin in the morning and 30 units at night   Yes [provider]  liraglutide (VICTOZA) 18 MG/3ML SOPN Inject 1.8 mg into the skin daily.   Yes [provider]  magnesium oxide (MAG-OX) 400 MG tablet Take 400 mg by mouth daily. 03/17/15  Yes [provider]  Molnupiravir 200 MG CAPS 4 capsules 06/27/20  Yes [provider]   Past Medical History:  Diagnosis Date   Arthritis    Chronic kidney disease    stage 4   Diabetes mellitus    GERD (gastroesophageal reflux disease)    Hyperlipemia    Hypertension    Seasonal allergies     Sleep apnea    Past Surgical History:  Procedure Laterality Date   AV FISTULA PLACEMENT Left 04/10/2020   Procedure: LEFT ARM ARTERIOVENOUS (AV) FISTULA CREATION;  Surgeon: Elam Dutch, MD;  Location: [place];  Service: Vascular;  Laterality: Left;   CAPSULOTOMY  01/13/2012   Procedure: MINOR CAPSULOTOMY;  Surgeon: Myrtha Mantis., MD;  Location: [place];  Service: Ophthalmology;  Laterality: Left;   DORSAL COMPARTMENT RELEASE  2009   rt    FISTULA SUPERFICIALIZATION Left 05/29/2020   Procedure: LEFT UPPER ARM FISTULA SUPERFICIALIZATION WITH LIGATION OF MULTIPLE SIDE BRANCHES;  Surgeon: Elam Dutch, MD;  Location: [place];  Service: Vascular;  Laterality: Left;   KNEE ARTHROSCOPY  02,04   right and left   STERIOD INJECTION  08/01/2011   Procedure: STEROID INJECTION;  Surgeon: Cammie Sickle., MD;  Location: [place];  Service: Orthopedics;  Laterality: Left;  cmc left thumb   TRIGGER FINGER RELEASE  2006   rt thumb   TRIGGER FINGER RELEASE  08/01/2011   Procedure: RELEASE TRIGGER FINGER/A-1 PULLEY;  Surgeon: Cammie Sickle., MD;  Location: [place];  Service: Orthopedics;  Laterality: Left;  left thumb   YAG LASER APPLICATION  19/41/7408   Procedure: YAG LASER APPLICATION;  Surgeon: Myrtha Mantis., MD;  Location: [place];  Service: Ophthalmology;  Laterality: Left;   History reviewed. No pertinent family history. Social History   Socioeconomic History   Marital status: Single    Spouse name: Not on file   Number of children: Not on file   Years of education: Not on file   Highest education level: Not on file  Occupational History   Not on file  Tobacco Use   Smoking status: Never   Smokeless tobacco: Never  Vaping Use   Vaping Use: Never used  Substance and Sexual Activity   Alcohol use: No   Drug use: No   Sexual activity: Not on file  Other Topics Concern   Not on file  Social History Narrative   Not on file    Social Determinants of Health   Financial Resource Strain: Not on file  Food Insecurity: Not on file  Transportation Needs: Not on file  Physical Activity: Not on file  Stress: Not on file  Social Connections: Not on file  Intimate Partner Violence: Not on file   ROS-see HPI   Negative unless "+" Constitutional:    weight loss, night sweats, fevers, chills, fatigue, lassitude. HEENT:    headaches, difficulty swallowing, tooth/dental problems, sore throat,       sneezing, itching, ear ache, nasal congestion, post nasal drip, snoring CV:    chest pain, orthopnea, PND, swelling in lower extremities, anasarca,                                  dizziness, palpitations Resp:   shortness of breath with exertion or  at rest.                productive cough,   non-productive cough, coughing up of blood.              change in color of mucus.  wheezing.   Skin:    rash or lesions. GI:  No-   heartburn, indigestion, abdominal pain, nausea, vomiting, diarrhea,                 change in bowel habits, loss of appetite GU: dysuria, change in color of urine, no urgency or frequency.   flank pain. MS:   joint pain, stiffness, decreased range of motion, back pain. Neuro-     nothing unusual Psych:  change in mood or affect.  depression or anxiety.   memory loss.  OBJ- Physical Exam General- Alert, Oriented, Affect-appropriate, Distress- none acute, +obese Skin- rash-none, lesions- none, excoriation- none Lymphadenopathy- none Head- atraumatic            Eyes- Gross vision intact, PERRLA, conjunctivae and secretions clear            Ears- Hearing, canals-normal            Nose- Clear, no-Septal dev, mucus, polyps, erosion, perforation             Throat- Mallampati IV , mucosa clear , drainage- none, tonsils absent, +teeth Neck- flexible , trachea midline, no stridor , thyroid nl, carotid no bruit Chest - symmetrical excursion , unlabored           Heart/CV- RRR , no murmur , no gallop  , no rub,  nl s1 s2                           - JVD- none , edema- none, stasis changes- none, varices- none           Lung- clear to P&A, wheeze- none, cough- none , dullness-none, rub- none           Chest wall-  Abd-  Br/ Gen/ Rectal- Not done, not indicated Extrem-+access L arm Neuro- grossly intact to observation

## 2021-05-11 ENCOUNTER — Ambulatory Visit (INDEPENDENT_AMBULATORY_CARE_PROVIDER_SITE_OTHER): Payer: Managed Care, Other (non HMO) | Admitting: Internal Medicine

## 2021-05-11 ENCOUNTER — Encounter: Payer: Self-pay | Admitting: Internal Medicine

## 2021-05-11 VITALS — BP 132/52 | HR 71 | Temp 98.2°F | Ht 67.0 in | Wt 271.2 lb

## 2021-05-11 DIAGNOSIS — G4733 Obstructive sleep apnea (adult) (pediatric): Secondary | ICD-10-CM | POA: Diagnosis not present

## 2021-05-11 NOTE — Assessment & Plan Note (Signed)
We are awaiting download information from [facility].  Meanwhile it is clear that she benefits from CPAP and has been compliant by description. ?Plan-replace old machine, continuing present settings until we have download information.  Refer for mask fitting. ?

## 2021-05-11 NOTE — Assessment & Plan Note (Signed)
Significantly overweight.  If she can bring this down will help management for sleep apnea and other health problems. ?

## 2021-05-11 NOTE — Patient Instructions (Signed)
Order- schedule mask fitting at sleep center ? ?Order- DME [employer] please replace old CPAP machine, current settings, mask of choice, humidifier, supplies. Please install AirView/ Card ? ?Please call if we can help ?

## 2021-05-21 NOTE — Telephone Encounter (Signed)
Spoke with Sister, She is on a DPR, however its not valid as the sister signed it not the pt. ? ?The sister will pick up the form and have her sister sign it and bring it back so we can update the chart. ? ?Sister is [place]  ?

## 2021-05-25 ENCOUNTER — Telehealth: Payer: Self-pay | Admitting: Internal Medicine

## 2021-05-28 ENCOUNTER — Other Ambulatory Visit: Payer: Self-pay | Admitting: Physician Assistant

## 2021-05-28 ENCOUNTER — Ambulatory Visit
Admission: RE | Admit: 2021-05-28 | Discharge: 2021-05-28 | Disposition: A | Discharge status: Home / Self Care | Payer: Self-pay | Source: Ambulatory Visit | Attending: Physician Assistant | Admitting: Physician Assistant

## 2021-05-28 DIAGNOSIS — M25532 Pain in left wrist: Secondary | ICD-10-CM

## 2021-05-28 NOTE — Telephone Encounter (Signed)
I called and spoke with the pt  ?She states that she never called the office and does not have any questions about her insurance or claims  ?Will close encounter ?

## 2021-05-30 ENCOUNTER — Ambulatory Visit (INDEPENDENT_AMBULATORY_CARE_PROVIDER_SITE_OTHER): Payer: Commercial Managed Care - HMO

## 2021-05-30 ENCOUNTER — Ambulatory Visit: Payer: Commercial Managed Care - HMO | Admitting: Family

## 2021-05-30 ENCOUNTER — Encounter: Payer: Self-pay | Admitting: Family

## 2021-05-30 VITALS — Ht 68.0 in | Wt 270.0 lb

## 2021-05-30 DIAGNOSIS — W19XXXA Unspecified fall, initial encounter: Secondary | ICD-10-CM | POA: Diagnosis not present

## 2021-05-30 DIAGNOSIS — S62649A Nondisplaced fracture of proximal phalanx of unspecified finger, initial encounter for closed fracture: Secondary | ICD-10-CM | POA: Diagnosis not present

## 2021-05-30 DIAGNOSIS — M25542 Pain in joints of left hand: Secondary | ICD-10-CM

## 2021-05-30 DIAGNOSIS — M25572 Pain in left ankle and joints of left foot: Secondary | ICD-10-CM

## 2021-05-30 DIAGNOSIS — S93492A Sprain of other ligament of left ankle, initial encounter: Secondary | ICD-10-CM

## 2021-05-31 ENCOUNTER — Telehealth: Payer: Self-pay | Admitting: Family

## 2021-05-31 NOTE — Telephone Encounter (Signed)
(  AttnJarrett Soho fax number [phone] ) ?[facility] Radiology is requesting notes from yesterdays appt for hand and wrist to be faxed over. ?

## 2021-05-31 NOTE — Telephone Encounter (Signed)
Junie Panning, can you please dictate her note when you get a chance? Thank you. ?

## 2021-06-01 NOTE — Progress Notes (Signed)
? ?Office Visit Note ?  ?Patient: Chelsey Lee           ?Date of Birth: 1955/11/26           ?MRN: [id number] ?Visit Date: 05/30/2021 ?             ?Requested by: Willey Blade, MD ?[address] ?[address] ?[place],  [place] [address] ?PCP: Willey Blade, MD ? ?No chief complaint on file. ? ? ? ? ?HPI: ?The patient is a 66 year old woman who presents today for initial evaluation of left hand, left foot and left ankle pain.  She is about a week out from a fall.  She fell and landed on her left side she is not exactly certain how she caught herself with her she fell on an outstretched hand or not but she has been having significant swelling of her left hand and pain with weightbearing on the left ankle.  She was seen [facility] for [facility] injury radiographs were performed she states they told her her wrist and ankle were not fractured unfortunately I am unable to do these radiographs today ? ?She is walking with a rolling walker ? ?She would also like to follow-up on her total knees she is requesting total knee arthroplasty of the left her BMI is currently down to 41 she is aware she needs to continue working on bringing this down.  Her last A1c was not under good control either. ? ?Assessment & Plan: ?Visit Diagnoses:  ?1. Pain in left ankle and joints of left foot   ?2. Fall, initial encounter   ? ? ?Plan: We will place her in a ulnar gutter splint.  Fracture of her proximal phalanx fifth finger.  Will follow-up with radiographs and evaluation in 2 more weeks.  Have also placed her in an ASO for her ankle sprain on the left reassurance provided there is no sign of fracture of the fifth metatarsal ? ?We will continue to work on surgical planning for her total knee arthroplasty ? ?Follow-Up Instructions: Return in about 2 weeks (around 06/13/2021).  ? ?Left Ankle Exam  ? ?Tenderness  ?The patient is experiencing tenderness in the ATF.  ?Swelling: moderate ? ?Range of Motion  ?The patient has  normal left ankle ROM.  ? ?Tests  ?Anterior drawer: negative ? ?Other  ?Erythema: absent ?Sensation: normal ?Pulse: present ? ?Comments:  Significantly tender along the fifth metatarsal ? ? ?Left Hand Exam  ? ?Tenderness  ?The patient is experiencing tenderness in the palmar area.  ? ?Muscle Strength  ?Grip:  3/5  ? ?Other  ?Erythema: absent ?Sensation: normal ?Pulse: present ? ?Comments:  Some stiffness with active range of motion in the fourth and fifth fingers.  She does have significant edema in her hand and distal wrist.  She also has base of thumb pain pain with loading however this is baseline ? ? ? ? ?Patient is alert, oriented, no adenopathy, well-dressed, normal affect, normal respiratory effort. ? ? ?Imaging: ?No results found. ?No images are attached to the encounter. ? ?Labs: ?Lab Results  ?Component Value Date  ? LABURIC 8.9 (H) 03/06/2020  ? ? ? ?Lab Results  ?Component Value Date  ? ALBUMIN 3.1 (L) 07/20/2015  ? ? ?No results found for: MG ?No results found for: VD25OH ? ?No results found for: PREALBUMIN ? ?  Latest Ref Rng & Units 11/14/2020  ?  3:56 PM 05/29/2020  ?  9:13 AM 04/10/2020  ?  9:51 AM  ?  CBC EXTENDED  ?WBC 4.0 - 10.5 K/uL 5.4      ?RBC 3.87 - 5.11 MIL/uL 3.45    ? 3.46      ?Hemoglobin 12.0 - 15.0 g/dL 10.0   11.6   10.9    ?HCT 36.0 - 46.0 % 31.9   34.0   32.0    ?Platelets 150 - 400 K/uL 206      ?NEUT# 1.7 - 7.7 K/uL 3.3      ?Lymph# 0.7 - 4.0 K/uL 1.3      ? ? ? ?Body mass index is 41.05 kg/m?. ? ?Orders:  ?Orders Placed This Encounter  ?Procedures  ? XR Ankle 2 Views Left  ? XR Foot Complete Left  ? XR Hand Complete Left  ? ?No orders of the defined types were placed in this encounter. ? ? ? Procedures: ?No procedures performed ? ?Clinical Data: ?No additional findings. ? ?ROS: ? ?All other systems negative, except as noted in the HPI. ?Review of Systems ? ?Objective: ?Vital Signs: Ht '5\' 8"'$  (1.727 m)   Wt 270 lb (122.5 kg)   BMI 41.05 kg/m?  ? ?Specialty Comments:  ?No specialty  comments available. ? ?PMFS History: ?Patient Active Problem List  ? Diagnosis Date Noted  ? Cerebrovascular disease 05/11/2020  ? Gout 03/09/2020  ? Allergic rhinitis 01/25/2020  ? 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  ? Colon cancer screening 01/25/2020  ? Gastroesophageal reflux disease without esophagitis 01/25/2020  ? Hyperlipidemia 01/25/2020  ? Morbid obesity ([place]) 01/25/2020  ? Personal history of colonic polyps 01/25/2020  ? OSA (obstructive sleep apnea) 01/25/2020  ? Vitamin D deficiency 01/25/2020  ? Controlled type 2 diabetes mellitus without complication ([place]) 79/02/4095  ? Chronic kidney disease, stage 4 (severe) ([place]) 06/29/2019  ? Long term (current) use of insulin ([place]) 06/29/2019  ? Epidural lipomatosis 03/02/2019  ? Spondylolisthesis of lumbar region 03/02/2019  ? Spinal stenosis of lumbar region with neurogenic claudication 03/02/2019  ? Left wrist tendinitis 09/07/2018  ? Localized osteoarthritis of left knee 04/03/2018  ? Celiac disease 08/21/2016  ? Abnormal thyroid function test 04/01/2016  ? Pain in thumb joint with movement of right hand 09/14/2015  ? 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  ? Moderate major depression, single episode ([place]) 02/02/2010  ? Diabetic renal disease ([place]) 06/07/2009  ? Anemia 01/18/2009  ? Essential hypertension 01/18/2009  ? ?Past Medical History:  ?Diagnosis Date  ? Arthritis   ? Chronic kidney disease   ? stage 4  ? Diabetes mellitus   ? GERD (gastroesophageal reflux disease)   ? Hyperlipemia   ? Hypertension   ? Seasonal allergies   ? Sleep apnea   ?  ?History reviewed. No pertinent family history.  ?Past Surgical History:  ?Procedure Laterality Date  ? AV FISTULA PLACEMENT Left 04/10/2020  ? Procedure: LEFT ARM ARTERIOVENOUS (AV) FISTULA CREATION;  Surgeon: Elam Dutch, MD;  [facility];  Service: Vascular;  Laterality: Left;  ? CAPSULOTOMY  01/13/2012  ? Procedure: MINOR CAPSULOTOMY;  Surgeon: Myrtha Mantis., MD;  Location: [place];  Service:  Ophthalmology;  Laterality: Left;  ? DORSAL COMPARTMENT RELEASE  2009  ? rt   ? FISTULA SUPERFICIALIZATION Left 05/29/2020  ? Procedure: LEFT UPPER ARM FISTULA SUPERFICIALIZATION WITH LIGATION OF MULTIPLE SIDE BRANCHES;  Surgeon: Elam Dutch, MD;  Location: [place];  Service: Vascular;  Laterality: Left;  ? KNEE ARTHROSCOPY  02,04  ? right and left  ? STERIOD INJECTION  08/01/2011  ? Procedure: STEROID  INJECTION;  Surgeon: Cammie Sickle., MD;  Location: [facility];  Service: Orthopedics;  Laterality: Left;  cmc left thumb  ? TRIGGER FINGER RELEASE  2006  ? rt thumb  ? TRIGGER FINGER RELEASE  08/01/2011  ? Procedure: RELEASE TRIGGER FINGER/A-1 PULLEY;  Surgeon: Cammie Sickle., MD;  Location: [place];  Service: Orthopedics;  Laterality: Left;  left thumb  ? YAG LASER APPLICATION  65/78/4696  ? Procedure: YAG LASER APPLICATION;  Surgeon: Myrtha Mantis., MD;  Location: [place];  Service: Ophthalmology;  Laterality: Left;  ? ?Social History  ? ?Occupational History  ? Not on file  ?Tobacco Use  ? Smoking status: Never  ? Smokeless tobacco: Never  ?Vaping Use  ? Vaping Use: Never used  ?Substance and Sexual Activity  ? Alcohol use: No  ? Drug use: No  ? Sexual activity: Not on file  ? ? ? ? ? ?

## 2021-06-01 NOTE — Telephone Encounter (Signed)
Faxed note to Helene Kelp ?

## 2021-06-08 ENCOUNTER — Telehealth: Payer: Self-pay | Admitting: Internal Medicine

## 2021-06-08 NOTE — Telephone Encounter (Signed)
I don't know repair status of her CPAP. That would have to be determined by her DME company. If she is not eligible to replace an old machine, then it is up to [facility] to determine repair status andd maybe send her machine off for service.

## 2021-06-08 NOTE — Telephone Encounter (Signed)
I called and spoke with Chelsey Lee and she reports that the CPAP would have to be broken beyond repair before insurance would cover a new machine.   I called the patient and the call was disconnected. I called her back and she reports that Dr. Annamaria Boots had told her that she was eligible for a new CPAP.   She wants to see if you could write something in your note about the machine is not working beyond repair since [facility] has taken a deposit for a new machine and insurance will not let her get a new one now. Please advise.

## 2021-06-12 NOTE — Telephone Encounter (Signed)
Noted  

## 2021-06-13 ENCOUNTER — Ambulatory Visit: Payer: Commercial Managed Care - HMO | Admitting: Family

## 2021-06-13 ENCOUNTER — Encounter: Payer: Self-pay | Admitting: Family

## 2021-06-13 ENCOUNTER — Ambulatory Visit: Payer: Self-pay

## 2021-06-13 ENCOUNTER — Ambulatory Visit (INDEPENDENT_AMBULATORY_CARE_PROVIDER_SITE_OTHER): Payer: Commercial Managed Care - HMO

## 2021-06-13 DIAGNOSIS — M25572 Pain in left ankle and joints of left foot: Secondary | ICD-10-CM

## 2021-06-13 DIAGNOSIS — S93492A Sprain of other ligament of left ankle, initial encounter: Secondary | ICD-10-CM | POA: Diagnosis not present

## 2021-06-13 DIAGNOSIS — M79642 Pain in left hand: Secondary | ICD-10-CM | POA: Diagnosis not present

## 2021-06-13 NOTE — Progress Notes (Signed)
Office Visit Note   Patient: Ruta Hinds           Date of Birth: 08-Jun-1955           MRN: [id number] Visit Date: 06/13/2021              Requested by: Willey Blade, [address] [place],  [place] [address] PCP: Willey Blade, MD  Chief Complaint  Patient presents with   Left Hand - Follow-up   Left Ankle - Follow-up      HPI: Patient unsure because like and is a 66 year old woman who is seen in follow-up for left ankle injury as well as left hand injury.    She has been in a ulnar gutter splint since last visit for 5th metacarpal fracture.  Continues to have issues with pain and swelling to the fifth metacarpal as well as the little finger.  Swelling in the finger loss of range of motion at the DIP joint  She has been in an ASO for her lateral ankle sprain on the left she feels well in the brace she does have some pain along the lateral column of her foot she states that she has significant pain when she attempts to walk without her brace and without shoe wear  Assessment & Plan: Visit Diagnoses:  1. Pain in left hand   2. Pain in left ankle and joints of left foot   3. Sprain of anterior talofibular ligament of left ankle, initial encounter     Plan: Repeat radiographs of the foot are concerning for fracture through the base of the fifth metatarsal placed her in a cam walker today she will continue this until next visit.    We will have her see Dr. Tempie Donning for her left hand.  Follow-Up Instructions: No follow-ups on file.   Right Ankle Exam   Tenderness  The patient is experiencing tenderness in the ATF. Swelling: mild   Comments:  Tenderness to base of 5th MT   Left Hand Exam   Tenderness  The patient is experiencing tenderness in the ulnar area.   Range of Motion  The patient has normal left wrist ROM. Hand  DIP Little: abnormal   Muscle Strength  The patient has normal left wrist strength. Grip:  4/5   Other   Erythema: absent Sensation: normal Pulse: present  Comments:  Tender to 5th MC and 5th finger, 5th finger with decrease rom and edema, most at DIP     Patient is alert, oriented, no adenopathy, well-dressed, normal affect, normal respiratory effort.   Imaging: No results found. No images are attached to the encounter.  Labs: Lab Results  Component Value Date   LABURIC 8.9 (H) 03/06/2020     Lab Results  Component Value Date   ALBUMIN 3.1 (L) 07/20/2015    No results found for: MG No results found for: VD25OH  No results found for: PREALBUMIN    Latest Ref Rng & Units 11/14/2020    3:56 PM 05/29/2020    9:13 AM 04/10/2020    9:51 AM  CBC EXTENDED  WBC 4.0 - 10.5 K/uL 5.4      RBC 3.87 - 5.11 MIL/uL 3.45     3.46      Hemoglobin 12.0 - 15.0 g/dL 10.0   11.6   10.9    HCT 36.0 - 46.0 % 31.9   34.0   32.0    Platelets 150 - 400 K/uL 206  NEUT# 1.7 - 7.7 K/uL 3.3      Lymph# 0.7 - 4.0 K/uL 1.3         There is no height or weight on file to calculate BMI.  Orders:  Orders Placed This Encounter  Procedures   XR Hand Complete Left   XR Foot Complete Left   No orders of the defined types were placed in this encounter.    Procedures: No procedures performed  Clinical Data: No additional findings.  ROS:  All other systems negative, except as noted in the HPI. Review of Systems  Objective: Vital Signs: There were no vitals taken for this visit.  Specialty Comments:  No specialty comments available.  PMFS History: Patient Active Problem List   Diagnosis Date Noted   Cerebrovascular disease 05/11/2020   Gout 03/09/2020   Allergic rhinitis 01/25/2020   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   Colon cancer screening 01/25/2020   Gastroesophageal reflux disease without esophagitis 01/25/2020   Hyperlipidemia 01/25/2020   Morbid obesity ([place]) 01/25/2020   Personal history of colonic polyps 01/25/2020   OSA (obstructive sleep apnea)  01/25/2020   Vitamin D deficiency 01/25/2020   Controlled type 2 diabetes mellitus without complication ([place]) 94/76/5465   Chronic kidney disease, stage 4 (severe) ([place]) 06/29/2019   Long term (current) use of insulin ([place]) 06/29/2019   Epidural lipomatosis 03/02/2019   Spondylolisthesis of lumbar region 03/02/2019   Spinal stenosis of lumbar region with neurogenic claudication 03/02/2019   Left wrist tendinitis 09/07/2018   Localized osteoarthritis of left knee 04/03/2018   Celiac disease 08/21/2016   Abnormal thyroid function test 04/01/2016   Pain in thumb joint with movement of right hand 09/14/2015   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   Moderate major depression, single episode ([place]) 02/02/2010   Diabetic renal disease ([place]) 06/07/2009   Anemia 01/18/2009   Essential hypertension 01/18/2009   Past Medical History:  Diagnosis Date   Arthritis    Chronic kidney disease    stage 4   Diabetes mellitus    GERD (gastroesophageal reflux disease)    Hyperlipemia    Hypertension    Seasonal allergies    Sleep apnea     History reviewed. No pertinent family history.  Past Surgical History:  Procedure Laterality Date   AV FISTULA PLACEMENT Left 04/10/2020   Procedure: LEFT ARM ARTERIOVENOUS (AV) FISTULA CREATION;  Surgeon: Elam Dutch, MD;  Location: [facility];  Service: Vascular;  Laterality: Left;   CAPSULOTOMY  01/13/2012   Procedure: MINOR CAPSULOTOMY;  Surgeon: Myrtha Mantis., MD;  Location: [facility];  Service: Ophthalmology;  Laterality: Left;   DORSAL COMPARTMENT RELEASE  2009   rt    FISTULA SUPERFICIALIZATION Left 05/29/2020   Procedure: LEFT UPPER ARM FISTULA SUPERFICIALIZATION WITH LIGATION OF MULTIPLE SIDE BRANCHES;  Surgeon: Elam Dutch, MD;  Location: [facility];  Service: Vascular;  Laterality: Left;   KNEE ARTHROSCOPY  02,04   right and left   STERIOD INJECTION  08/01/2011   Procedure: STEROID INJECTION;  Surgeon: Cammie Sickle., MD;  Location: [place];  Service: Orthopedics;  Laterality: Left;  cmc left thumb   TRIGGER FINGER RELEASE  2006   rt thumb   TRIGGER FINGER RELEASE  08/01/2011   Procedure: RELEASE TRIGGER FINGER/A-1 PULLEY;  Surgeon: Cammie Sickle., MD;  Location: [facility];  Service: Orthopedics;  Laterality: Left;  left thumb   YAG LASER APPLICATION  03/54/6568   Procedure: YAG LASER  APPLICATION;  Surgeon: Myrtha Mantis., MD;  Location: [facility];  Service: Ophthalmology;  Laterality: Left;   Social History   Occupational History   Not on file  Tobacco Use   Smoking status: Never   Smokeless tobacco: Never  Vaping Use   Vaping Use: Never used  Substance and Sexual Activity   Alcohol use: No   Drug use: No   Sexual activity: Not on file

## 2021-06-28 ENCOUNTER — Ambulatory Visit: Payer: Commercial Managed Care - HMO | Admitting: Orthopedic Surgery

## 2021-06-29 ENCOUNTER — Encounter: Payer: Self-pay | Admitting: Orthopedic Surgery

## 2021-06-29 ENCOUNTER — Ambulatory Visit (INDEPENDENT_AMBULATORY_CARE_PROVIDER_SITE_OTHER): Payer: Commercial Managed Care - HMO | Admitting: Orthopedic Surgery

## 2021-06-29 DIAGNOSIS — M79642 Pain in left hand: Secondary | ICD-10-CM | POA: Diagnosis not present

## 2021-06-29 NOTE — Progress Notes (Signed)
Office Visit Note   Patient: Chelsey Lee           Date of Birth: 1955-10-31           MRN: [id number] Visit Date: 06/29/2021              Requested by: Willey Blade, [place],  [place] [id number] PCP: Willey Blade, MD   Assessment & Plan: Visit Diagnoses:  1. Pain in left hand     Plan: We reviewed patient's previous x-rays which do not demonstrate any obvious fracture in the fifth metacarpal and proximal phalanx but there is a defect at the ulnar base of the index proximal phalanx.  She has no evidence of index finger instability at the MCP joint.  She notes that she is only 70% better compared to postinjury.  She will continue to work on hand and finger range of motion.  If she has difficulty regaining her motion or fails to improve then she can see me back as needed.  Follow-Up Instructions: No follow-ups on file.   Orders:  No orders of the defined types were placed in this encounter.  No orders of the defined types were placed in this encounter.     Procedures: No procedures performed   Clinical Data: No additional findings.   Subjective: Chief Complaint  Patient presents with   Left Hand - Follow-up, Pain    RIGHT Handed, Pain: 0/10, DOI: 05/2021, states that she had a fall, discomfort along the little finger, index finger, and thumb. + n/t, swelling    This is a 66 year old right-hand-dominant female who presents with pain in the left index and small finger.  She had a ground-level fall in the beginning of May.  Since then she developed pain discomfort along the small finger at the index finger MCP joint.  Initially her whole hand was swollen and ecchymotic.  She had tenderness along the small finger from the metacarpal to the proximal phalanx.  She also had some pain at the index finger MCP joint.  She notes that since the injury she has been steadily improving.  She is about 70% better now.  She has been working on range  of motion of her hand.  Her swelling is much improved but she still has mild swelling at the index finger MCP joint.    Review of Systems   Objective: Vital Signs: BP (!) 126/56 (BP Location: Left Arm, Patient Position: Sitting)   Pulse 82   Ht '5\' 7"'$  (1.702 m)   Wt 271 lb (122.9 kg)   BMI 42.44 kg/m   Physical Exam Constitutional:      Appearance: Normal appearance.  Cardiovascular:     Rate and Rhythm: Normal rate.     Pulses: Normal pulses.  Pulmonary:     Effort: Pulmonary effort is normal.  Skin:    General: Skin is warm and dry.     Capillary Refill: Capillary refill takes less than 2 seconds.  Neurological:     Mental Status: She is alert.     Right Hand Exam   Tenderness  Right hand tenderness location: Very mild TTP at base of index proximal phalanx.  Other  Erythema: absent Sensation: normal Pulse: present  Comments:  Mild swelling at index MCP joint. Able to make a near complete fist but lacks approx 1 cm from [facility] with index finger.  No radial or ulnar laxity or pain at index MCPJ when tested in extension  or flexion.       Specialty Comments:  No specialty comments available.  Imaging: No results found.   PMFS History: Patient Active Problem List   Diagnosis Date Noted   Pain in left hand 06/29/2021   Cerebrovascular disease 05/11/2020   Gout 03/09/2020   Allergic rhinitis 01/25/2020   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   Colon cancer screening 01/25/2020   Gastroesophageal reflux disease without esophagitis 01/25/2020   Hyperlipidemia 01/25/2020   Morbid obesity ([place]) 01/25/2020   Personal history of colonic polyps 01/25/2020   OSA (obstructive sleep apnea) 01/25/2020   Vitamin D deficiency 01/25/2020   Controlled type 2 diabetes mellitus without complication ([place]) 16/10/9602   Chronic kidney disease, stage 4 (severe) ([place]) 06/29/2019   Long term (current) use of insulin ([place]) 06/29/2019   Epidural lipomatosis 03/02/2019    Spondylolisthesis of lumbar region 03/02/2019   Spinal stenosis of lumbar region with neurogenic claudication 03/02/2019   Left wrist tendinitis 09/07/2018   Localized osteoarthritis of left knee 04/03/2018   Celiac disease 08/21/2016   Abnormal thyroid function test 04/01/2016   Pain in thumb joint with movement of right hand 09/14/2015   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   Moderate major depression, single episode ([place]) 02/02/2010   Diabetic renal disease ([place]) 06/07/2009   Anemia 01/18/2009   Essential hypertension 01/18/2009   Past Medical History:  Diagnosis Date   Arthritis    Chronic kidney disease    stage 4   Diabetes mellitus    GERD (gastroesophageal reflux disease)    Hyperlipemia    Hypertension    Seasonal allergies    Sleep apnea     No family history on file.  Past Surgical History:  Procedure Laterality Date   AV FISTULA PLACEMENT Left 04/10/2020   Procedure: LEFT ARM ARTERIOVENOUS (AV) FISTULA CREATION;  Surgeon: Elam Dutch, MD;  Location: [facility];  Service: Vascular;  Laterality: Left;   CAPSULOTOMY  01/13/2012   Procedure: MINOR CAPSULOTOMY;  Surgeon: Myrtha Mantis., MD;  Location: [facility];  Service: Ophthalmology;  Laterality: Left;   DORSAL COMPARTMENT RELEASE  2009   rt    FISTULA SUPERFICIALIZATION Left 05/29/2020   Procedure: LEFT UPPER ARM FISTULA SUPERFICIALIZATION WITH LIGATION OF MULTIPLE SIDE BRANCHES;  Surgeon: Elam Dutch, MD;  Location: [facility];  Service: Vascular;  Laterality: Left;   KNEE ARTHROSCOPY  02,04   right and left   STERIOD INJECTION  08/01/2011   Procedure: STEROID INJECTION;  Surgeon: Cammie Sickle., MD;  Location: [facility];  Service: Orthopedics;  Laterality: Left;  cmc left thumb   TRIGGER FINGER RELEASE  2006   rt thumb   TRIGGER FINGER RELEASE  08/01/2011   Procedure: RELEASE TRIGGER FINGER/A-1 PULLEY;  Surgeon: Cammie Sickle., MD;  Location: [facility];  Service: Orthopedics;   Laterality: Left;  left thumb   YAG LASER APPLICATION  54/09/8117   Procedure: YAG LASER APPLICATION;  Surgeon: Myrtha Mantis., MD;  Location: [facility];  Service: Ophthalmology;  Laterality: Left;   Social History   Occupational History   Not on file  Tobacco Use   Smoking status: Never   Smokeless tobacco: Never  Vaping Use   Vaping Use: Never used  Substance and Sexual Activity   Alcohol use: No   Drug use: No   Sexual activity: Not on file

## 2021-07-02 ENCOUNTER — Telehealth: Payer: Self-pay | Admitting: Internal Medicine

## 2021-07-03 ENCOUNTER — Other Ambulatory Visit (HOSPITAL_BASED_OUTPATIENT_CLINIC_OR_DEPARTMENT_OTHER): Payer: Managed Care, Other (non HMO) | Admitting: Internal Medicine

## 2021-07-04 ENCOUNTER — Ambulatory Visit (HOSPITAL_BASED_OUTPATIENT_CLINIC_OR_DEPARTMENT_OTHER): Payer: Commercial Managed Care - HMO | Attending: Internal Medicine | Admitting: Internal Medicine

## 2021-07-04 DIAGNOSIS — G4733 Obstructive sleep apnea (adult) (pediatric): Secondary | ICD-10-CM

## 2021-07-04 NOTE — Telephone Encounter (Signed)
Spoke to [place] with [employer]. She stated that she would fax DL to [facility] office.  Will route to Dr. Annamaria Boots to be on the lookout.

## 2021-07-04 NOTE — Telephone Encounter (Signed)
We need CPAP download from [facility]. Please push them on this.  I will put addendum on her April OV note for preop clearance.

## 2021-07-04 NOTE — Telephone Encounter (Signed)
Spoke to patient's sister, [name](DPR).  Valerie Salts stated that it was mentioned at last OV that Dr. Annamaria Boots would review pt's DL and determine if settings need to be adjusted.  She stated that patient will be having a total knee replacement. Clearance is needed Dr. Annamaria Boots.   Dr. Annamaria Boots, please advise.

## 2021-07-10 ENCOUNTER — Ambulatory Visit: Payer: Commercial Managed Care - HMO | Admitting: Family

## 2021-07-13 ENCOUNTER — Ambulatory Visit: Payer: Commercial Managed Care - HMO | Admitting: Family

## 2021-07-25 ENCOUNTER — Ambulatory Visit (INDEPENDENT_AMBULATORY_CARE_PROVIDER_SITE_OTHER): Payer: Commercial Managed Care - HMO | Admitting: Family

## 2021-07-25 ENCOUNTER — Encounter: Payer: Self-pay | Admitting: Family

## 2021-07-25 DIAGNOSIS — M1712 Unilateral primary osteoarthritis, left knee: Secondary | ICD-10-CM

## 2021-07-25 DIAGNOSIS — S93492A Sprain of other ligament of left ankle, initial encounter: Secondary | ICD-10-CM

## 2021-07-27 NOTE — Progress Notes (Signed)
Office Visit Note   Patient: Ruta Hinds           Date of Birth: April 10, 1955           MRN: [id number] Visit Date: 07/25/2021              Requested by: Willey Blade, [place],  [place] [id number] PCP: Willey Blade, MD  No chief complaint on file.     HPI: The patient is a 66 year old woman who is seen today in follow-up for ankle sprain on the left.  She also had significant pain along the lateral column of her foot with the injury.  She has now advance out of her ASO and is in regular shoewear feels comfortable walking with a straight cane at this time.  She is quite pleased with the progress of pain on the left ankle and foot.  She is eager to be set up for her total knee arthroplasty on the left  States this was put on hold due to her fall  She is a Tuesday Thursday Saturday dialysis patient  Assessment & Plan: Visit Diagnoses: No diagnosis found.  Plan: Pleased with resolution of her ankle sprain.  Will talk with her about getting her set up for her total knee arthroplasty.  Follow-Up Instructions: Return if symptoms worsen or fail to improve.   Left Ankle Exam   Tenderness  The patient is experiencing tenderness in the ATF.   Range of Motion  The patient has normal left ankle ROM.   Muscle Strength  The patient has normal left ankle strength.  Tests  Anterior drawer: negative  Other  Sensation: normal Pulse: present      Patient is alert, oriented, no adenopathy, well-dressed, normal affect, normal respiratory effort.   Imaging: No results found. No images are attached to the encounter.  Labs: Lab Results  Component Value Date   LABURIC 8.9 (H) 03/06/2020     Lab Results  Component Value Date   ALBUMIN 3.1 (L) 07/20/2015    No results found for: "MG" No results found for: "VD25OH"  No results found for: "PREALBUMIN"    Latest Ref Rng & Units 11/14/2020    3:56 PM 05/29/2020    9:13 AM  04/10/2020    9:51 AM  CBC EXTENDED  WBC 4.0 - 10.5 K/uL 5.4     RBC 3.87 - 5.11 MIL/uL 3.87 - 5.11 MIL/uL 3.45    3.46     Hemoglobin 12.0 - 15.0 g/dL 10.0  11.6  10.9   HCT 36.0 - 46.0 % 31.9  34.0  32.0   Platelets 150 - 400 K/uL 206     NEUT# 1.7 - 7.7 K/uL 3.3     Lymph# 0.7 - 4.0 K/uL 1.3        There is no height or weight on file to calculate BMI.  Orders:  No orders of the defined types were placed in this encounter.  No orders of the defined types were placed in this encounter.    Procedures: No procedures performed  Clinical Data: No additional findings.  ROS:  All other systems negative, except as noted in the HPI. Review of Systems  Objective: Vital Signs: There were no vitals taken for this visit.  Specialty Comments:  No specialty comments available.  PMFS History: Patient Active Problem List   Diagnosis Date Noted   Pain in left hand 06/29/2021   Cerebrovascular disease 05/11/2020   Gout 03/09/2020  Allergic rhinitis 01/25/2020   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   Colon cancer screening 01/25/2020   Gastroesophageal reflux disease without esophagitis 01/25/2020   Hyperlipidemia 01/25/2020   Morbid obesity ([place]) 01/25/2020   Personal history of colonic polyps 01/25/2020   OSA (obstructive sleep apnea) 01/25/2020   Vitamin D deficiency 01/25/2020   Controlled type 2 diabetes mellitus without complication ([place]) 84/66/5993   Chronic kidney disease, stage 4 (severe) ([place]) 06/29/2019   Long term (current) use of insulin ([place]) 06/29/2019   Epidural lipomatosis 03/02/2019   Spondylolisthesis of lumbar region 03/02/2019   Spinal stenosis of lumbar region with neurogenic claudication 03/02/2019   Left wrist tendinitis 09/07/2018   Localized osteoarthritis of left knee 04/03/2018   Celiac disease 08/21/2016   Abnormal thyroid function test 04/01/2016   Pain in thumb joint with movement of right hand 09/14/2015   Non-toxic goiter  08/04/2015   Moderate major depression, single episode ([place]) 02/02/2010   Diabetic renal disease ([place]) 06/07/2009   Anemia 01/18/2009   Essential hypertension 01/18/2009   Past Medical History:  Diagnosis Date   Arthritis    Chronic kidney disease    stage 4   Diabetes mellitus    GERD (gastroesophageal reflux disease)    Hyperlipemia    Hypertension    Seasonal allergies    Sleep apnea     History reviewed. No pertinent family history.  Past Surgical History:  Procedure Laterality Date   AV FISTULA PLACEMENT Left 04/10/2020   Procedure: LEFT ARM ARTERIOVENOUS (AV) FISTULA CREATION;  Surgeon: Elam Dutch, MD;  Location: [facility];  Service: Vascular;  Laterality: Left;   CAPSULOTOMY  01/13/2012   Procedure: MINOR CAPSULOTOMY;  Surgeon: Myrtha Mantis., MD;  Location: [facility];  Service: Ophthalmology;  Laterality: Left;   DORSAL COMPARTMENT RELEASE  2009   rt    FISTULA SUPERFICIALIZATION Left 05/29/2020   Procedure: LEFT UPPER ARM FISTULA SUPERFICIALIZATION WITH LIGATION OF MULTIPLE SIDE BRANCHES;  Surgeon: Elam Dutch, MD;  Location: [facility];  Service: Vascular;  Laterality: Left;   KNEE ARTHROSCOPY  02,04   right and left   STERIOD INJECTION  08/01/2011   Procedure: STEROID INJECTION;  Surgeon: Cammie Sickle., MD;  Location: [facility];  Service: Orthopedics;  Laterality: Left;  cmc left thumb   TRIGGER FINGER RELEASE  2006   rt thumb   TRIGGER FINGER RELEASE  08/01/2011   Procedure: RELEASE TRIGGER FINGER/A-1 PULLEY;  Surgeon: Cammie Sickle., MD;  Location: [place];  Service: Orthopedics;  Laterality: Left;  left thumb   YAG LASER APPLICATION  57/01/7791   Procedure: YAG LASER APPLICATION;  Surgeon: Myrtha Mantis., MD;  Location: [facility];  Service: Ophthalmology;  Laterality: Left;   Social History   Occupational History   Not on file  Tobacco Use   Smoking status: Never   Smokeless tobacco: Never  Vaping Use    Vaping Use: Never used  Substance and Sexual Activity   Alcohol use: No   Drug use: No   Sexual activity: Not on file

## 2021-07-31 ENCOUNTER — Telehealth: Payer: Self-pay | Admitting: Internal Medicine

## 2021-07-31 NOTE — Telephone Encounter (Signed)
Last phone note from stated [facility] would fax the download to our office. I was able to print the download.   Dr. Annamaria Boots, can you please advise on the download? Thanks!

## 2021-08-01 NOTE — Telephone Encounter (Signed)
CPAP download- 05/02/21- 07/30/21. CPAP/[facility] is set on fixed pressure 11 cwp. She has met usage goals on 81% of nights, which is acceptable. She is getting very good control, with only 1.7 average breakthrough apneas/ hour. I am pleased with this report. We think her machine is 65 years old, so she is eligible to get a new replacement machine. I recommend we order DME [facility] to replace old CPAP machine, changing setting to autopap 5-15, mask of choice, humidifier, supplies and Please add AirView/ card. Thanks

## 2021-08-01 NOTE — Telephone Encounter (Signed)
Called and left voicemail for patient to call office back in regards to cpap download and new orders that Dr Annamaria Boots is wanting to do.

## 2021-08-01 NOTE — Telephone Encounter (Signed)
Spoke to patient's sister, [name](DPR) and relayed below message/recommendations. Valerie Salts feels that patient received a replacement machine in April, however she is going to contact patient to verify. She will call back with update.

## 2021-08-01 NOTE — Telephone Encounter (Signed)
Spoke to stephanie with [employer]. [place] stated that insurance denied replacement cpap until patient's cpap machine is broken beyond repair. I have spoken to patient's sister, Valerie Salts and relayed above message.   Routing to Dr. Annamaria Boots to make aware.

## 2021-08-10 ENCOUNTER — Ambulatory Visit: Payer: Managed Care, Other (non HMO) | Admitting: Internal Medicine

## 2021-09-04 ENCOUNTER — Telehealth: Payer: Self-pay | Admitting: Orthopedic Surgery

## 2021-09-04 NOTE — Telephone Encounter (Signed)
I spoke with Ms. Jian' Sister Olga Coaster.  She wanted to discuss the scheduling of Micaela's knee replacement.  She would like to have a consultation appointment with Dr. Sharol Given before we schedule this surgery.  Valerie Salts is going to be the patient's caregiver and will need to know what is involved with the surgery and after care.

## 2021-09-06 ENCOUNTER — Encounter: Payer: Self-pay | Admitting: Internal Medicine

## 2021-09-09 NOTE — Progress Notes (Signed)
HPI F never smoker followed for OSA, complicated by  HTN, Cerebrovascular Disease, Allergic Rhinitis, Celiac Disease, GERD, DM2, Goiter, Epidural Lipomatosis, CKD4/ ESRD/Dialysis, Anemia, Gout, Osteoarthritis,  NPSG 04/04/15 AHI 24.4/ hr, desaturation to 77%, Body weight 278 lbs  ==============================================================  07/05/21- Addendum- Preop Risk Assessment Asked for preop clearance for Knee surgery. Based on exam 05/11/21, patient considered to be stable, at moderate risk for complications of surgery and GOT due to multiple co-morbidities. No acute issues or readily correctable respiratory problems identified for which presurgical intervention would improve risk of complication.  09/10/21- 65 yoF never smoker followed for OSA, complicated by  HTN, Cerebrovascular Disease, Allergic Rhinitis, Celiac Disease, GERD, DM2, Goiter, Epidural Lipomatosis, CKD4/ ESRD/Dialysis, Anemia, Gout, Osteoarthritis,  CPAP  11 / [facility]    AirSense10 AutoSet           replacement was ordered, but denied by insurance until her machine is "broken beyond repair" Download compliance- 100%, AHI 1.5/ hr Body weight-272 lbs Covid vax- -----Pt f/u on OSA, pt states CPAP is working well unfortunately she is having issues w/ the newer mask Download reviewed.  She says her new mask slides on her face too much.  She agrees to a repeat mask fitting. Now on dialysis. Still pending knee replacement  ROS    += positive Constitutional:    weight loss, night sweats, fevers, chills, fatigue, lassitude. HEENT:    headaches, difficulty swallowing, tooth/dental problems, sore throat,       sneezing, itching, ear ache, nasal congestion, post nasal drip, snoring CV:    chest pain, orthopnea, PND, swelling in lower extremities, anasarca,                                   dizziness, palpitations Resp:   shortness of breath with exertion or at rest.                productive cough,   non-productive cough, coughing  up of blood.              change in color of mucus.  wheezing.   Skin:    rash or lesions. GI:  No-   heartburn, indigestion, abdominal pain, nausea, vomiting, diarrhea,                 change in bowel habits, loss of appetite GU: dysuria, change in color of urine, no urgency or frequency.   flank pain. MS:   joint pain, stiffness, decreased range of motion, back pain. Neuro-     nothing unusual Psych:  change in mood or affect.  depression or anxiety.   memory loss.  OBJ- Physical Exam General- Alert, Oriented, Affect-appropriate, Distress- none acute, +obese Skin- rash-none, lesions- none, excoriation- none Lymphadenopathy- none Head- atraumatic            Eyes- Gross vision intact, PERRLA, conjunctivae and secretions clear            Ears- Hearing, canals-normal            Nose- Clear, no-Septal dev, mucus, polyps, erosion, perforation             Throat- Mallampati IV , mucosa clear , drainage- none, tonsils absent, +teeth Neck- flexible , trachea midline, no stridor , thyroid nl, carotid no bruit Chest - symmetrical excursion , unlabored           Heart/CV- RRR , no murmur , no gallop  ,  no rub, nl s1 s2                           - JVD- none , edema- none, stasis changes- none, varices- none           Lung- clear to P&A, wheeze- none, cough- none , dullness-none, rub- none           Chest wall-  Abd-  Br/ Gen/ Rectal- Not done, not indicated Extrem-+access L arm Neuro- grossly intact to observation

## 2021-09-10 ENCOUNTER — Ambulatory Visit: Payer: Self-pay | Admitting: Internal Medicine

## 2021-09-10 ENCOUNTER — Encounter: Payer: Self-pay | Admitting: Internal Medicine

## 2021-09-10 VITALS — BP 100/58 | HR 70 | Ht 67.0 in | Wt 272.0 lb

## 2021-09-10 DIAGNOSIS — G4733 Obstructive sleep apnea (adult) (pediatric): Secondary | ICD-10-CM

## 2021-09-10 DIAGNOSIS — N184 Chronic kidney disease, stage 4 (severe): Secondary | ICD-10-CM

## 2021-09-10 NOTE — Patient Instructions (Signed)
Order- schedule mask fitting at sleep center (return visit to try again)  Ok to go forward with knee surgery when surgeon schedules it.  We can continue CPAP 11  Please cal if we can help

## 2021-09-26 ENCOUNTER — Encounter: Payer: Self-pay | Admitting: Internal Medicine

## 2021-09-26 NOTE — Assessment & Plan Note (Signed)
Now on dialysis managed by nephrology

## 2021-09-26 NOTE — Assessment & Plan Note (Signed)
Benefits from CPAP with good compliance and control. Plan-continue CPAP 11, refer for repeat mask fitting

## 2021-10-01 ENCOUNTER — Telehealth: Payer: Self-pay | Admitting: Orthopedic Surgery

## 2021-10-01 ENCOUNTER — Encounter: Payer: Self-pay | Admitting: Orthopedic Surgery

## 2021-10-01 ENCOUNTER — Ambulatory Visit (INDEPENDENT_AMBULATORY_CARE_PROVIDER_SITE_OTHER): Payer: Medicare Other | Admitting: Orthopedic Surgery

## 2021-10-01 DIAGNOSIS — M1712 Unilateral primary osteoarthritis, left knee: Secondary | ICD-10-CM | POA: Diagnosis not present

## 2021-10-01 MED ORDER — HYDROCODONE-ACETAMINOPHEN 5-325 MG PO TABS: 1.0000 | ORAL_TABLET | Freq: Four times a day (QID) | ORAL | 0 refills | Status: DC | PRN

## 2021-10-01 NOTE — Telephone Encounter (Signed)
Izora Gala james pts sister/caregiver called and states pt needs something for pain when she starts physical therapy   CB [phone]

## 2021-10-01 NOTE — Progress Notes (Signed)
Office Visit Note   Patient: Ruta Hinds           Date of Birth: 10-Sep-1955           MRN: [id number] Visit Date: 10/01/2021              Requested by: Willey Blade, [address] [place],  [place] [address] PCP: Willey Blade, MD  Chief Complaint  Patient presents with   Left Knee - Follow-up    Discuss total knee replacement surgery.       HPI: Patient is a 66 year old woman who presents in follow-up for osteoarthritis left knee.  Patient currently ambulates with a cane and has difficulty with activities of daily living due to knee arthritis.  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  Assessment & Plan: Visit Diagnoses:  1. Primary osteoarthritis of left knee     Plan: We will set up a prerehab evaluation for strengthening.  Patient's sister is the power of attorney and discussed that if patient is unable to discharged home after surgery she would most likely need skilled nursing placement.  Patient and sister want to avoid skilled nursing placement.  Recommended continue weight loss exercise and strengthening.  Follow-Up Instructions: Return in about 2 months (around 12/01/2021).   Ortho Exam  Patient is alert, oriented, no adenopathy, well-dressed, normal affect, normal respiratory effort. Examination patient has difficulty getting from a sitting to a standing position she ambulates with a cane.  Left knee has no cellulitis there is swelling.  Previous radiographs show essentially bone-on-bone contact medial lateral joint line with subchondral or cysts and sclerosis and periarticular bony spurs.  No recent laboratory studies available in her chart.  She does have a history of gout with no recent uric acid and no recent hemoglobin A1c.  Imaging: No results found. No images are attached to the encounter.  Labs: Lab Results  Component Value Date   LABURIC 8.9 (H)  03/06/2020     Lab Results  Component Value Date   ALBUMIN 3.1 (L) 07/20/2015    No results found for: "MG" No results found for: "VD25OH"  No results found for: "PREALBUMIN"    Latest Ref Rng & Units 11/14/2020    3:56 PM 05/29/2020    9:13 AM 04/10/2020    9:51 AM  CBC EXTENDED  WBC 4.0 - 10.5 K/uL 5.4     RBC 3.87 - 5.11 MIL/uL 3.87 - 5.11 MIL/uL 3.45    3.46     Hemoglobin 12.0 - 15.0 g/dL 10.0  11.6  10.9   HCT 36.0 - 46.0 % 31.9  34.0  32.0   Platelets 150 - 400 K/uL 206     NEUT# 1.7 - 7.7 K/uL 3.3     Lymph# 0.7 - 4.0 K/uL 1.3        There is no height or weight on file to calculate BMI.  Orders:  No orders of the defined types were placed in this encounter.  No orders of the defined types were placed in this encounter.    Procedures: No procedures performed  Clinical Data: No additional findings.  ROS:  All other systems negative, except as noted in the HPI. Review of Systems  Objective: Vital Signs: There were no vitals taken for this visit.  Specialty Comments:  No specialty comments available.  PMFS History: Patient Active Problem List   Diagnosis Date Noted   Pain in  left hand 06/29/2021   Cerebrovascular disease 05/11/2020   Gout 03/09/2020   Allergic rhinitis 01/25/2020   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   Colon cancer screening 01/25/2020   Gastroesophageal reflux disease without esophagitis 01/25/2020   Hyperlipidemia 01/25/2020   Morbid obesity ([place]) 01/25/2020   Personal history of colonic polyps 01/25/2020   OSA (obstructive sleep apnea) 01/25/2020   Vitamin D deficiency 01/25/2020   Controlled type 2 diabetes mellitus without complication ([place]) 49/20/1007   Chronic kidney disease, stage 4 (severe) ([place]) 06/29/2019   Long term (current) use of insulin ([place]) 06/29/2019   Epidural lipomatosis 03/02/2019   Spondylolisthesis of lumbar region 03/02/2019   Spinal stenosis of lumbar region with neurogenic claudication  03/02/2019   Left wrist tendinitis 09/07/2018   Localized osteoarthritis of left knee 04/03/2018   Celiac disease 08/21/2016   Abnormal thyroid function test 04/01/2016   Pain in thumb joint with movement of right hand 09/14/2015   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   Moderate major depression, single episode ([place]) 02/02/2010   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 No pertinent family history.  Past Surgical History:  Procedure Laterality Date   AV FISTULA PLACEMENT Left 04/10/2020   Procedure: LEFT ARM ARTERIOVENOUS (AV) FISTULA CREATION;  Surgeon: Elam Dutch, MD;  Location: [facility];  Service: Vascular;  Laterality: Left;   CAPSULOTOMY  01/13/2012   Procedure: MINOR CAPSULOTOMY;  Surgeon: Myrtha Mantis., MD;  Location: [facility];  Service: Ophthalmology;  Laterality: Left;   DORSAL COMPARTMENT RELEASE  2009   rt    FISTULA SUPERFICIALIZATION Left 05/29/2020   Procedure: LEFT UPPER ARM FISTULA SUPERFICIALIZATION WITH LIGATION OF MULTIPLE SIDE BRANCHES;  Surgeon: Elam Dutch, MD;  Location: [facility];  Service: Vascular;  Laterality: Left;   KNEE ARTHROSCOPY  02,04   right and left   STERIOD INJECTION  08/01/2011   Procedure: STEROID INJECTION;  Surgeon: Cammie Sickle., MD;  Location: [facility];  Service: Orthopedics;  Laterality: Left;  cmc left thumb   TRIGGER FINGER RELEASE  2006   rt thumb   TRIGGER FINGER RELEASE  08/01/2011   Procedure: RELEASE TRIGGER FINGER/A-1 PULLEY;  Surgeon: Cammie Sickle., MD;  Location: [facility];  Service: Orthopedics;  Laterality: Left;  left thumb   YAG LASER APPLICATION  01/09/7587   Procedure: YAG LASER  APPLICATION;  Surgeon: Myrtha Mantis., MD;  Location: [facility];  Service: Ophthalmology;  Laterality: Left;   Social History   Occupational History   Not on file  Tobacco Use   Smoking status: Never   Smokeless tobacco: Never  Vaping Use   Vaping Use: Never used  Substance and Sexual Activity   Alcohol use: No   Drug use: No   Sexual activity: Not on file

## 2021-10-01 NOTE — Addendum Note (Signed)
Addended by: Meridee Score on: 10/01/2021 03:30 PM   Modules accepted: Orders

## 2021-10-01 NOTE — Telephone Encounter (Signed)
Pt was seen this morning for left knee pain, referred for PT, please advise if  Rx pain med needed.

## 2021-10-01 NOTE — Telephone Encounter (Signed)
[facility] informed. She did ask if we can call her to arrange PT appts. Chauncy Lean seen where you have tried reaching pt. Can you call her sister about this? Thank you.

## 2021-10-08 ENCOUNTER — Ambulatory Visit (HOSPITAL_BASED_OUTPATIENT_CLINIC_OR_DEPARTMENT_OTHER): Payer: Medicare Other | Attending: Internal Medicine | Admitting: Radiology

## 2021-10-08 DIAGNOSIS — G4733 Obstructive sleep apnea (adult) (pediatric): Secondary | ICD-10-CM

## 2021-10-10 ENCOUNTER — Telehealth: Payer: Self-pay | Admitting: Orthopedic Surgery

## 2021-10-10 NOTE — Telephone Encounter (Signed)
Patient sister would like to information about sisters  knee replacement  [phone]

## 2021-10-10 NOTE — Telephone Encounter (Signed)
I called and sw pt's sister Valerie Salts who is on HIPAA advised that pt will need prehab assessment tot see if she will be strong enough to proceed with total knee and tolerate the therapy needed afterwards. They have an appt sch on Friday.

## 2021-10-11 NOTE — Therapy (Signed)
Note created In error. Eval performed by another PT

## 2021-10-12 ENCOUNTER — Ambulatory Visit (INDEPENDENT_AMBULATORY_CARE_PROVIDER_SITE_OTHER): Payer: Medicare Other | Admitting: Physical Therapy

## 2021-10-12 ENCOUNTER — Encounter: Payer: Self-pay | Admitting: Physical Therapy

## 2021-10-12 ENCOUNTER — Ambulatory Visit: Payer: Medicare Other | Admitting: Physical Therapy

## 2021-10-12 DIAGNOSIS — R2681 Unsteadiness on feet: Secondary | ICD-10-CM

## 2021-10-12 DIAGNOSIS — M6281 Muscle weakness (generalized): Secondary | ICD-10-CM

## 2021-10-12 DIAGNOSIS — R262 Difficulty in walking, not elsewhere classified: Secondary | ICD-10-CM | POA: Diagnosis not present

## 2021-10-12 DIAGNOSIS — G8929 Other chronic pain: Secondary | ICD-10-CM

## 2021-10-12 DIAGNOSIS — M25562 Pain in left knee: Secondary | ICD-10-CM | POA: Diagnosis not present

## 2021-10-12 DIAGNOSIS — M25561 Pain in right knee: Secondary | ICD-10-CM | POA: Diagnosis not present

## 2021-10-12 NOTE — Therapy (Signed)
OUTPATIENT PHYSICAL THERAPY LOWER EXTREMITY EVALUATION   Patient Name: Chelsey Lee MRN: [id number] DOB:1955/05/30, 66 y.o., female Today's Date: 10/12/2021   PT End of Session - 10/12/21 1112     Visit Number 1    Number of Visits 17    Date for PT Re-Evaluation 12/07/21    Authorization Type Medicare and BCBS    Authorization Time Period 10/12/21 to 12/07/21    Progress Note Due on Visit 10    PT Start Time 1013    PT Stop Time 1100    PT Time Calculation (min) 47 min    Activity Tolerance Patient tolerated treatment well    Behavior During Therapy [facility] for tasks assessed/performed             Past Medical History:  Diagnosis Date   Arthritis    Chronic kidney disease    stage 4   Diabetes mellitus    GERD (gastroesophageal reflux disease)    Hyperlipemia    Hypertension    Seasonal allergies    Sleep apnea    Past Surgical History:  Procedure Laterality Date   AV FISTULA PLACEMENT Left 04/10/2020   Procedure: LEFT ARM ARTERIOVENOUS (AV) FISTULA CREATION;  Surgeon: Elam Dutch, MD;  Location: [facility];  Service: Vascular;  Laterality: Left;   CAPSULOTOMY  01/13/2012   Procedure: MINOR CAPSULOTOMY;  Surgeon: Myrtha Mantis., MD;  Location: [facility];  Service: Ophthalmology;  Laterality: Left;   DORSAL COMPARTMENT RELEASE  2009   rt    FISTULA SUPERFICIALIZATION Left 05/29/2020   Procedure: LEFT UPPER ARM FISTULA SUPERFICIALIZATION WITH LIGATION OF MULTIPLE SIDE BRANCHES;  Surgeon: Elam Dutch, MD;  Location: [place];  Service: Vascular;  Laterality: Left;   KNEE ARTHROSCOPY  02,04   right and left   STERIOD INJECTION  08/01/2011   Procedure: STEROID INJECTION;  Surgeon: Cammie Sickle., MD;  Location: [place];  Service: Orthopedics;  Laterality: Left;  cmc left thumb   TRIGGER FINGER RELEASE  2006   rt thumb   TRIGGER FINGER RELEASE  08/01/2011   Procedure: RELEASE TRIGGER FINGER/A-1 PULLEY;  Surgeon: Cammie Sickle., MD;   Location: [place];  Service: Orthopedics;  Laterality: Left;  left thumb   YAG LASER APPLICATION  41/66/0630   Procedure: YAG LASER APPLICATION;  Surgeon: Myrtha Mantis., MD;  Location: [facility];  Service: Ophthalmology;  Laterality: Left;   Patient Active Problem List   Diagnosis Date Noted   Pain in left hand 06/29/2021   Cerebrovascular disease 05/11/2020   Gout 03/09/2020   Allergic rhinitis 01/25/2020   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   Colon cancer screening 01/25/2020   Gastroesophageal reflux disease without esophagitis 01/25/2020   Hyperlipidemia 01/25/2020   Morbid obesity ([place]) 01/25/2020   Personal history of colonic polyps 01/25/2020   OSA (obstructive sleep apnea) 01/25/2020   Vitamin D deficiency 01/25/2020   Controlled type 2 diabetes mellitus without complication ([place]) 16/01/930   Chronic kidney disease, stage 4 (severe) ([place]) 06/29/2019   Long term (current) use of insulin ([place]) 06/29/2019   Epidural lipomatosis 03/02/2019   Spondylolisthesis of lumbar region 03/02/2019   Spinal stenosis of lumbar region with neurogenic claudication 03/02/2019   Left wrist tendinitis 09/07/2018   Localized osteoarthritis of left knee 04/03/2018   Celiac disease 08/21/2016   Abnormal thyroid function test 04/01/2016   Pain in thumb joint with movement of right hand 09/14/2015   Non-toxic  goiter 08/04/2015   Moderate major depression, single episode ([place]) 02/02/2010   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   Anemia 01/18/2009   Essential hypertension 01/18/2009    PCP: Willey Blade   REFERRING PROVIDER: Newt Minion, MD   REFERRING DIAG: [phone] (ICD-10-CM) - Primary osteoarthritis of left knee   THERAPY DIAG:  Chronic pain of right knee - Plan: PT plan of care cert/re-cert  Chronic pain of left knee - Plan: PT plan of care cert/re-cert  Muscle weakness (generalized) - Plan: PT plan of care cert/re-cert  Difficulty in  walking, not elsewhere classified - Plan: PT plan of care cert/re-cert  Unsteadiness on feet - Plan: PT plan of care cert/re-cert  Rationale for Evaluation and Treatment Rehabilitation  ONSET DATE: 10/01/2021   SUBJECTIVE:   SUBJECTIVE STATEMENT: My doctor is talking about doing a knee replacement soon, I have no cartilage in my knees. They want to replace the right one first, the doctor wants me to get stronger and get moving a little better before surgery. Both knees hurt in general. On HD T/Th/Sat. 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   PERTINENT HISTORY: HPI: Patient is a 66 year old woman who presents in follow-up for osteoarthritis left knee.  Patient currently ambulates with a cane and has difficulty with activities of daily living due to knee arthritis.  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   Assessment & Plan: Visit Diagnoses:  1. Primary osteoarthritis of left knee       Plan: We will set up a prerehab evaluation for strengthening.  Patient's sister is the power of attorney and discussed that if patient is unable to discharged home after surgery she would most likely need skilled nursing placement.  Patient and sister want to avoid skilled nursing placement.  Recommended continue weight loss exercise and strengthening.    PAIN:  Are you having pain? Yes: NPRS scale: 6/10; at worst can get to 9/10 Pain location: both knees  Pain description: dull pain, audible grinding  Aggravating factors: bending knees to sit down, steps, going from sit to stand  Relieving factors: sitting down/getting weight off of LEs   PRECAUTIONS: None  WEIGHT BEARING RESTRICTIONS No  FALLS:  Has patient fallen in last 6 months? Yes. 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  Lives in: House/apartment Stairs: 1 STE no rails, 1 step inside the home  Has following equipment at home: Single point cane and toilet riser with rails   OCCUPATION: retired   PLOF: Independent, Independent with basic ADLs, Independent with gait, and Independent with transfers  [place] for knees not to hurt and be able to walk without a cane, improve balance    OBJECTIVE:   DIAGNOSTIC FINDINGS:   PATIENT SURVEYS:  FOTO 44  COGNITION:  Overall cognitive status: Within functional limits for tasks assessed     SENSATION: Not tested but reports some numbness in both thighs for past 5-6 years does have hx of back pain/back issues   EDEMA:    MUSCLE LENGTH:   POSTURE:   PALPATION: Medial R knee TTP   LOWER EXTREMITY ROM:  Active ROM  Right eval Left eval  Hip flexion    Hip extension    Hip abduction    Hip adduction    Hip internal rotation    Hip external rotation    Knee flexion A: 114 A: 108  Knee extension A: 3 with heel prop  A: -2 with heel prop   Ankle dorsiflexion    Ankle plantarflexion    Ankle inversion    Ankle eversion     (Blank rows = not tested)  LOWER EXTREMITY MMT:  MMT Right eval Left eval  Hip flexion 4- 4-  Hip extension    Hip abduction 3 3  Hip adduction    Hip internal rotation    Hip external rotation    Knee flexion 3+ 3  Knee extension 4+ 4+  Ankle dorsiflexion 5 5  Ankle plantarflexion    Ankle inversion    Ankle eversion     (Blank rows = not tested)  LOWER EXTREMITY SPECIAL TESTS:    FUNCTIONAL TESTS:  Berg Balance Scale: 33/56  GAIT: Distance walked: in clinic distances  Assistive device utilized: Single point cane Level of assistance: Modified independence Comments: wide BOS, B LEs externally rotated, antalgic gait pattern, trunk flexed     TODAY'S  TREATMENT:  LAQs x5 B red TB Seated clamshells x5 red TB HS curls x5 B red TB    PATIENT EDUCATION:  Education details: HEP, exam findings, POC  Person educated: Patient Education method: Explanation, Demonstration, and Handouts Education comprehension: verbalized understanding and returned demonstration   HOME EXERCISE PROGRAM: [id number]  ASSESSMENT:  CLINICAL IMPRESSION: Patient is a 66 y.o. F who was seen today for physical therapy evaluation and treatment for prehab prior to possible TKR surgery. Exam is as expected with significant functional mm weakness, mild limitations in B knee ROM, impaired functional balance, gait deviations and impaired functional activity tolerance. Will benefit from skilled PT services to assist in preparing for surgery and to improve functional task performance/reduce fall risk.    OBJECTIVE IMPAIRMENTS Abnormal gait, decreased balance, decreased knowledge of use of DME, decreased mobility, difficulty walking, decreased ROM, decreased strength, decreased safety awareness, hypomobility, obesity, and pain.   ACTIVITY LIMITATIONS standing, squatting, stairs, transfers, bed mobility, locomotion level, and caring for others  PARTICIPATION LIMITATIONS: cleaning, laundry, shopping, community activity, and yard work  PERSONAL FACTORS Age, Behavior pattern, Fitness, Past/current experiences, Social background, and Time since onset of injury/illness/exacerbation are also affecting patient's functional outcome.   REHAB POTENTIAL: Good  CLINICAL DECISION MAKING: Stable/uncomplicated  EVALUATION COMPLEXITY: Low   GOALS: Goals reviewed with patient? Yes  SHORT TERM GOALS: Target date: 11/09/2021  Will be compliant with appropriate progressive HEP  Baseline: Goal status: INITIAL  2.  Pain in B knees to be no more than 6/10  Baseline:  Goal status: INITIAL  3.  Will be able to name 3 way to reduce fall risk at home and in the community  Baseline:   Goal status: INITIAL   LONG TERM GOALS: Target date: 12/07/2021   MMT to improve by at least 1 grade in all weak groups  Baseline:  Goal status: INITIAL  2.  Will score at least 43 on the Berg to show improved balance/reduced fall risk  Baseline:  Goal status: INITIAL  3.  Pain in B knees to be no more than 4/10 at worst  Baseline:  Goal status: INITIAL  4.  Will be able to tolerate trips to the grocery store without increased pain in order  to improve community access  Baseline:  Goal status: INITIAL  5.  Will be compliant with gym based HEP to help maintain readiness for potential surgery  Baseline:  Goal status: INITIAL   PLAN: PT FREQUENCY: 2x/week  PT DURATION: 8 weeks  PLANNED INTERVENTIONS: Therapeutic exercises, Therapeutic activity, Neuromuscular re-education, Balance training, Gait training, Patient/Family education, Self Care, Joint mobilization, DME instructions, Dry Needling, Electrical stimulation, Cryotherapy, Moist heat, Taping, Ultrasound, Ionotophoresis 74m/ml Dexamethasone, Manual therapy, and Re-evaluation  PLAN FOR NEXT SESSION: focus on general strengthening and balance    Dorenda Pfannenstiel U PT DPT PN2  10/12/2021, 11:14 AM

## 2021-10-15 ENCOUNTER — Encounter: Payer: Self-pay | Admitting: Physical Therapy

## 2021-10-15 ENCOUNTER — Ambulatory Visit (INDEPENDENT_AMBULATORY_CARE_PROVIDER_SITE_OTHER): Payer: Medicare Other | Admitting: Physical Therapy

## 2021-10-15 DIAGNOSIS — M25562 Pain in left knee: Secondary | ICD-10-CM | POA: Diagnosis not present

## 2021-10-15 DIAGNOSIS — R2681 Unsteadiness on feet: Secondary | ICD-10-CM

## 2021-10-15 DIAGNOSIS — R262 Difficulty in walking, not elsewhere classified: Secondary | ICD-10-CM

## 2021-10-15 DIAGNOSIS — M6281 Muscle weakness (generalized): Secondary | ICD-10-CM

## 2021-10-15 DIAGNOSIS — M25561 Pain in right knee: Secondary | ICD-10-CM | POA: Diagnosis not present

## 2021-10-15 DIAGNOSIS — G8929 Other chronic pain: Secondary | ICD-10-CM

## 2021-10-15 NOTE — Therapy (Signed)
OUTPATIENT PHYSICAL THERAPY TREATMENT NOTE   Patient Name: Chelsey Lee MRN: [id number] DOB:Feb 25, 1955, 66 y.o., female Today's Date: 10/15/2021  END OF SESSION:   PT End of Session - 10/15/21 1139     Visit Number 2    Number of Visits 17    Date for PT Re-Evaluation 12/07/21    Authorization Type Medicare and BCBS    Authorization Time Period 10/12/21 to 12/07/21    Progress Note Due on Visit 10    PT Start Time 1132    PT Stop Time 1216    PT Time Calculation (min) 44 min    Equipment Utilized During Treatment Gait belt    Activity Tolerance Patient tolerated treatment well    Behavior During Therapy WFL for tasks assessed/performed             Past Medical History:  Diagnosis Date   Arthritis    Chronic kidney disease    stage 4   Diabetes mellitus    GERD (gastroesophageal reflux disease)    Hyperlipemia    Hypertension    Seasonal allergies    Sleep apnea    Past Surgical History:  Procedure Laterality Date   AV FISTULA PLACEMENT Left 04/10/2020   Procedure: LEFT ARM ARTERIOVENOUS (AV) FISTULA CREATION;  Surgeon: Elam Dutch, MD;  Location: [place];  Service: Vascular;  Laterality: Left;   CAPSULOTOMY  01/13/2012   Procedure: MINOR CAPSULOTOMY;  Surgeon: Myrtha Mantis., MD;  Location: [place];  Service: Ophthalmology;  Laterality: Left;   DORSAL COMPARTMENT RELEASE  2009   rt    FISTULA SUPERFICIALIZATION Left 05/29/2020   Procedure: LEFT UPPER ARM FISTULA SUPERFICIALIZATION WITH LIGATION OF MULTIPLE SIDE BRANCHES;  Surgeon: Elam Dutch, MD;  Location: [place];  Service: Vascular;  Laterality: Left;   KNEE ARTHROSCOPY  02,04   right and left   STERIOD INJECTION  08/01/2011   Procedure: STEROID INJECTION;  Surgeon: Cammie Sickle., MD;  Location: [place];  Service: Orthopedics;  Laterality: Left;  cmc left thumb   TRIGGER FINGER RELEASE  2006   rt thumb   TRIGGER FINGER RELEASE  08/01/2011   Procedure: RELEASE TRIGGER  FINGER/A-1 PULLEY;  Surgeon: Cammie Sickle., MD;  Location: [place];  Service: Orthopedics;  Laterality: Left;  left thumb   YAG LASER APPLICATION  55/97/4163   Procedure: YAG LASER APPLICATION;  Surgeon: Myrtha Mantis., MD;  Location: [place];  Service: Ophthalmology;  Laterality: Left;   Patient Active Problem List   Diagnosis Date Noted   Pain in left hand 06/29/2021   Cerebrovascular disease 05/11/2020   Gout 03/09/2020   Allergic rhinitis 01/25/2020   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   Colon cancer screening 01/25/2020   Gastroesophageal reflux disease without esophagitis 01/25/2020   Hyperlipidemia 01/25/2020   Morbid obesity ([place]) 01/25/2020   Personal history of colonic polyps 01/25/2020   OSA (obstructive sleep apnea) 01/25/2020   Vitamin D deficiency 01/25/2020   Controlled type 2 diabetes mellitus without complication ([place]) 84/53/6468   Chronic kidney disease, stage 4 (severe) ([place]) 06/29/2019   Long term (current) use of insulin ([place]) 06/29/2019   Epidural lipomatosis 03/02/2019   Spondylolisthesis of lumbar region 03/02/2019   Spinal stenosis of lumbar region with neurogenic claudication 03/02/2019   Left wrist tendinitis 09/07/2018   Localized osteoarthritis of left knee 04/03/2018   Celiac disease 08/21/2016   Abnormal thyroid function test 04/01/2016   Pain  in thumb joint with movement of right hand 09/14/2015   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   Moderate major depression, single episode ([place]) 02/02/2010   Diabetic renal disease ([place]) 06/07/2009   Anemia 01/18/2009   Essential hypertension 01/18/2009     THERAPY DIAG:  Chronic pain of right knee  Chronic pain of left knee  Muscle weakness (generalized)  Difficulty in walking, not elsewhere classified  Unsteadiness on feet   PCP: Willey Blade   REFERRING PROVIDER: Newt Minion, MD   REFERRING DIAG: [id number] (ICD-10-CM) - Primary osteoarthritis of left knee    Rationale for Evaluation and Treatment Rehabilitation  ONSET DATE: 10/01/2021   SUBJECTIVE:   SUBJECTIVE STATEMENT: Doing the exercises, they are going okay.  Rt knee is a little sore.    PERTINENT HISTORY: HPI: Patient is a 66 year old woman who presents in follow-up for osteoarthritis left knee.  Patient currently ambulates with a cane and has difficulty with activities of daily living due to knee arthritis.  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    PAIN:  Are you having pain? Yes: NPRS scale: 0/10 currently; at worst can get to 9/10 Pain location: both knees  Pain description: dull pain, audible grinding  Aggravating factors: bending knees to sit down, steps, going from sit to stand  Relieving factors: sitting down/getting weight off of LEs   PRECAUTIONS: None  WEIGHT BEARING RESTRICTIONS No  FALLS:  Has patient fallen in last 6 months? Yes. 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  Lives in: House/apartment Stairs: 1 STE no rails, 1 step inside the home  Has following equipment at home: Single point cane and toilet riser with rails   OCCUPATION: retired   PLOF: Independent, Independent with basic ADLs, Independent with gait, and Independent with transfers  PATIENT GOALS for knees not to hurt and be able to walk without a cane, improve balance    OBJECTIVE:    PATIENT SURVEYS:  Eval: FOTO 44  COGNITION: Overall cognitive status: Within functional limits for tasks assessed     SENSATION: Not tested but reports some numbness in both thighs for past 5-6 years does have hx of back pain/back issues   PALPATION: Medial R knee TTP   LOWER EXTREMITY ROM:  Active ROM  Right eval Left eval  Hip flexion    Hip extension    Hip abduction    Hip adduction    Hip internal rotation    Hip external rotation    Knee flexion A: 114 A: 108  Knee extension A: 3 with heel prop  A: -2 with heel prop   Ankle dorsiflexion    Ankle plantarflexion    Ankle inversion    Ankle eversion     (Blank rows = not tested)  LOWER EXTREMITY MMT:  MMT Right eval Left eval  Hip flexion 4- 4-  Hip extension    Hip abduction 3 3  Hip adduction    Hip internal rotation    Hip external rotation    Knee flexion 3+ 3  Knee extension 4+ 4+  Ankle dorsiflexion 5 5  Ankle plantarflexion    Ankle inversion    Ankle eversion     (Blank rows = not tested)  FUNCTIONAL TESTS:  Eval: Berg Balance Scale: 33/56  GAIT: Distance walked: in clinic distances  Assistive device utilized: Single point cane Level of assistance: Modified independence Comments: wide BOS, B LEs externally rotated, antalgic gait pattern, trunk flexed     TODAY'S TREATMENT: 10/15/21: TherEx NuStep L4 x 5 min Seated LAQ with L2 band x 10 reps bil Hamstring curls with L2 band x 10 reps bil Seated hip abduction x 10 reps with L2 band Quad set bil 10 x 5 sec hold Supine heelslides x 10 reps  Gait Training: Amb with RW with min cues for sequencing and expectations post op; negotiated 6" curb with RW needing min A and min cues for sequencing  EVAL: LAQs x5 B red TB Seated clamshells x5 red TB HS curls x5 B red TB    PATIENT EDUCATION:  Education details: HEP, exam findings, POC  Person educated: Patient Education method: Explanation, Demonstration, and Handouts Education comprehension: verbalized understanding and returned demonstration   HOME EXERCISE PROGRAM: [id number]  ASSESSMENT:  CLINICAL IMPRESSION: Pt provided with updated HEP initiating post op exercises, and initiated single step training for entering/exiting the home.  She has a lot of difficulty with sit to/from stand at  this time and will need to improve strength to safely d/c home post TKA.  OBJECTIVE IMPAIRMENTS Abnormal gait, decreased balance, decreased knowledge of use of DME, decreased mobility, difficulty walking, decreased ROM, decreased strength, decreased safety awareness, hypomobility, obesity, and pain.   ACTIVITY LIMITATIONS standing, squatting, stairs, transfers, bed mobility, locomotion level, and caring for others  PARTICIPATION LIMITATIONS: cleaning, laundry, shopping, community activity, and yard work  PERSONAL FACTORS Age, Behavior pattern, Fitness, Past/current experiences, Social background, and Time since onset of injury/illness/exacerbation are also affecting patient's functional outcome.   REHAB POTENTIAL: Good  CLINICAL DECISION MAKING: Stable/uncomplicated  EVALUATION COMPLEXITY: Low   GOALS: Goals reviewed with patient? Yes  SHORT TERM GOALS: Target date: 11/09/2021  Will be compliant with appropriate progressive HEP  Baseline: Goal status: INITIAL  2.  Pain in B knees to be no more than 6/10  Baseline:  Goal status: INITIAL  3.  Will be able to name 3 way to reduce fall risk at home and in the community  Baseline:  Goal status: INITIAL   LONG TERM GOALS: Target date: 12/07/2021   MMT to improve by at least 1 grade in all weak groups  Baseline:  Goal status: INITIAL  2.  Will score at least 43 on the Berg to show improved balance/reduced fall risk  Baseline:  Goal status: INITIAL  3.  Pain in B knees to be no more than 4/10 at worst  Baseline:  Goal status: INITIAL  4.  Will be able to tolerate trips to the grocery store without increased pain in order to improve community access  Baseline:  Goal status: INITIAL  5.  Will be compliant with gym based HEP to help maintain readiness for potential surgery  Baseline:  Goal status: INITIAL   PLAN: PT FREQUENCY: 2x/week  PT DURATION: 8 weeks  PLANNED INTERVENTIONS: Therapeutic exercises, Therapeutic  activity, Neuromuscular re-education, Balance training, Gait training, Patient/Family education, Self Care, Joint mobilization, DME instructions, Dry Needling, Electrical stimulation, Cryotherapy, Moist heat, Taping, Ultrasound, Ionotophoresis 90m/ml Dexamethasone, Manual therapy, and Re-evaluation  PLAN FOR NEXT SESSION: practice single step with RW every session, focus on general strengthening and balance/post-op needs     SLaureen Abrahams PT, DPT 10/15/21 12:39 PM

## 2021-10-19 ENCOUNTER — Encounter: Payer: Medicare Other | Admitting: Physical Therapy

## 2021-10-24 ENCOUNTER — Ambulatory Visit (INDEPENDENT_AMBULATORY_CARE_PROVIDER_SITE_OTHER): Payer: Medicare Other | Admitting: Physical Therapy

## 2021-10-24 ENCOUNTER — Encounter: Payer: Self-pay | Admitting: Physical Therapy

## 2021-10-24 DIAGNOSIS — R262 Difficulty in walking, not elsewhere classified: Secondary | ICD-10-CM

## 2021-10-24 DIAGNOSIS — M25561 Pain in right knee: Secondary | ICD-10-CM | POA: Diagnosis not present

## 2021-10-24 DIAGNOSIS — R2681 Unsteadiness on feet: Secondary | ICD-10-CM

## 2021-10-24 DIAGNOSIS — G8929 Other chronic pain: Secondary | ICD-10-CM

## 2021-10-24 DIAGNOSIS — M25562 Pain in left knee: Secondary | ICD-10-CM

## 2021-10-24 DIAGNOSIS — M6281 Muscle weakness (generalized): Secondary | ICD-10-CM | POA: Diagnosis not present

## 2021-10-24 NOTE — Therapy (Signed)
OUTPATIENT PHYSICAL THERAPY TREATMENT NOTE   Patient Name: Chelsey Lee MRN: [id number] DOB:1955-01-31, 66 y.o., female Today's Date: 10/24/2021  END OF SESSION:   PT End of Session - 10/24/21 1316     Visit Number 3    Number of Visits 17    Date for PT Re-Evaluation 12/07/21    Authorization Type Medicare and BCBS    Authorization Time Period 10/12/21 to 12/07/21    Progress Note Due on Visit 10    PT Start Time 1307    PT Stop Time 1345    PT Time Calculation (min) 38 min    Equipment Utilized During Treatment Gait belt    Activity Tolerance Patient tolerated treatment well    Behavior During Therapy WFL for tasks assessed/performed             Past Medical History:  Diagnosis Date   Arthritis    Chronic kidney disease    stage 4   Diabetes mellitus    GERD (gastroesophageal reflux disease)    Hyperlipemia    Hypertension    Seasonal allergies    Sleep apnea    Past Surgical History:  Procedure Laterality Date   AV FISTULA PLACEMENT Left 04/10/2020   Procedure: LEFT ARM ARTERIOVENOUS (AV) FISTULA CREATION;  Surgeon: Elam Dutch, MD;  Location: [place];  Service: Vascular;  Laterality: Left;   CAPSULOTOMY  01/13/2012   Procedure: MINOR CAPSULOTOMY;  Surgeon: Myrtha Mantis., MD;  Location: [place];  Service: Ophthalmology;  Laterality: Left;   DORSAL COMPARTMENT RELEASE  2009   rt    FISTULA SUPERFICIALIZATION Left 05/29/2020   Procedure: LEFT UPPER ARM FISTULA SUPERFICIALIZATION WITH LIGATION OF MULTIPLE SIDE BRANCHES;  Surgeon: Elam Dutch, MD;  Location: [place];  Service: Vascular;  Laterality: Left;   KNEE ARTHROSCOPY  02,04   right and left   STERIOD INJECTION  08/01/2011   Procedure: STEROID INJECTION;  Surgeon: Cammie Sickle., MD;  Location: [place];  Service: Orthopedics;  Laterality: Left;  cmc left thumb   TRIGGER FINGER RELEASE  2006   rt thumb   TRIGGER FINGER RELEASE  08/01/2011   Procedure: RELEASE TRIGGER  FINGER/A-1 PULLEY;  Surgeon: Cammie Sickle., MD;  Location: [place];  Service: Orthopedics;  Laterality: Left;  left thumb   YAG LASER APPLICATION  62/56/3893   Procedure: YAG LASER APPLICATION;  Surgeon: Myrtha Mantis., MD;  Location: [place];  Service: Ophthalmology;  Laterality: Left;   Patient Active Problem List   Diagnosis Date Noted   Pain in left hand 06/29/2021   Cerebrovascular disease 05/11/2020   Gout 03/09/2020   Allergic rhinitis 01/25/2020   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   Colon cancer screening 01/25/2020   Gastroesophageal reflux disease without esophagitis 01/25/2020   Hyperlipidemia 01/25/2020   Morbid obesity ([place]) 01/25/2020   Personal history of colonic polyps 01/25/2020   OSA (obstructive sleep apnea) 01/25/2020   Vitamin D deficiency 01/25/2020   Controlled type 2 diabetes mellitus without complication ([place]) 73/42/8768   Chronic kidney disease, stage 4 (severe) ([place]) 06/29/2019   Long term (current) use of insulin ([place]) 06/29/2019   Epidural lipomatosis 03/02/2019   Spondylolisthesis of lumbar region 03/02/2019   Spinal stenosis of lumbar region with neurogenic claudication 03/02/2019   Left wrist tendinitis 09/07/2018   Localized osteoarthritis of left knee 04/03/2018   Celiac disease 08/21/2016   Abnormal thyroid function test 04/01/2016   Pain  in thumb joint with movement of right hand 09/14/2015   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   Moderate major depression, single episode ([place]) 02/02/2010   Diabetic renal disease ([place]) 06/07/2009   Anemia 01/18/2009   Essential hypertension 01/18/2009     THERAPY DIAG:  Chronic pain of right knee  Chronic pain of left knee  Muscle weakness (generalized)  Difficulty in walking, not elsewhere classified  Unsteadiness on feet   PCP: Willey Blade   REFERRING PROVIDER: Newt Minion, MD   REFERRING DIAG: [phone] (ICD-10-CM) - Primary osteoarthritis of left knee    Rationale for Evaluation and Treatment Rehabilitation  ONSET DATE: 10/01/2021   SUBJECTIVE:   SUBJECTIVE STATEMENT: Pain is okay today. Feels unsteady on her feet without UE support  PERTINENT HISTORY: HPI: Patient is a 66 year old woman who presents in follow-up for osteoarthritis left knee.  Patient currently ambulates with a cane and has difficulty with activities of daily living due to knee arthritis.  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    PAIN:  Are you having pain? Yes: NPRS scale: 0/10 currently; at worst can get to 9/10 Pain location: both knees  Pain description: dull pain, audible grinding  Aggravating factors: bending knees to sit down, steps, going from sit to stand  Relieving factors: sitting down/getting weight off of LEs   PRECAUTIONS: None  WEIGHT BEARING RESTRICTIONS No  FALLS:  Has patient fallen in last 6 months? Yes. 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  Lives in: House/apartment Stairs: 1 STE no rails, 1 step inside the home  Has following equipment at home: Single point cane and toilet riser with rails   OCCUPATION: retired   PLOF: Independent, Independent with basic ADLs, Independent with gait, and Independent with transfers  PATIENT GOALS for knees not to hurt and be able to walk without a cane, improve balance    OBJECTIVE:    PATIENT SURVEYS:  Eval: FOTO 44  COGNITION: Overall cognitive status: Within functional limits for tasks assessed     SENSATION: Not tested but reports some numbness in both thighs for past 5-6 years does have hx of back pain/back issues   PALPATION: Medial R knee TTP   LOWER EXTREMITY ROM:  Active ROM Right eval  Left eval  Hip flexion    Hip extension    Hip abduction    Hip adduction    Hip internal rotation    Hip external rotation    Knee flexion A: 114 A: 108  Knee extension A: 3 with heel prop  A: -2 with heel prop   Ankle dorsiflexion    Ankle plantarflexion    Ankle inversion    Ankle eversion     (Blank rows = not tested)  LOWER EXTREMITY MMT:  MMT Right eval Left eval  Hip flexion 4- 4-  Hip extension    Hip abduction 3 3  Hip adduction    Hip internal rotation    Hip external rotation    Knee flexion 3+ 3  Knee extension 4+ 4+  Ankle dorsiflexion 5 5  Ankle plantarflexion    Ankle inversion    Ankle eversion     (Blank rows = not tested)  FUNCTIONAL TESTS:  Eval:  Berg Balance Scale: 33/56  GAIT: Distance walked: in clinic distances  Assistive device utilized: Single point cane Level of assistance: Modified independence Comments: wide BOS, B LEs externally rotated, antalgic gait pattern, trunk flexed     TODAY'S TREATMENT: 10/24/21 Therex NuStep L4 x 6 min UE/LE Seated LAQ with 2# 2 x 10 reps bil Seated marches X 10 bilat Seated Hamstring curls with L2 band 2 x 10 reps bil Seated hip abduction x 15 reps with L2 band Seated quad set 5 sec X10  Gait Training: Amb with RW with min cues for sequencing and expectations post op; negotiated 6.5" curb with RW needing min A and min cues for sequencing  10/15/21: TherEx NuStep L4 x 5 min Seated LAQ with L2 band x 10 reps bil Hamstring curls with L2 band x 10 reps bil Seated hip abduction x 10 reps with L2 band Quad set bil 10 x 5 sec hold Supine heelslides x 10 reps  Gait Training: Amb with RW with min cues for sequencing and expectations post op; negotiated 6" curb with RW needing min A and min cues for sequencing  EVAL: LAQs x5 B red TB Seated clamshells x5 red TB HS curls x5 B red TB    PATIENT EDUCATION:  Education details: HEP, exam findings, POC  Person educated: Patient Education method:  Explanation, Demonstration, and Handouts Education comprehension: verbalized understanding and returned demonstration   HOME EXERCISE PROGRAM: [id number]  ASSESSMENT:  CLINICAL IMPRESSION: Continued efforts to work to improve overall bilat knee strength within her tolerance. She fatigues easy and needs rest breaks. PT recommending to continue current plan or care.  OBJECTIVE IMPAIRMENTS Abnormal gait, decreased balance, decreased knowledge of use of DME, decreased mobility, difficulty walking, decreased ROM, decreased strength, decreased safety awareness, hypomobility, obesity, and pain.   ACTIVITY LIMITATIONS standing, squatting, stairs, transfers, bed mobility, locomotion level, and caring for others  PARTICIPATION LIMITATIONS: cleaning, laundry, shopping, community activity, and yard work  PERSONAL FACTORS Age, Behavior pattern, Fitness, Past/current experiences, Social background, and Time since onset of injury/illness/exacerbation are also affecting patient's functional outcome.   REHAB POTENTIAL: Good  CLINICAL DECISION MAKING: Stable/uncomplicated  EVALUATION COMPLEXITY: Low   GOALS: Goals reviewed with patient? Yes  SHORT TERM GOALS: Target date: 11/09/2021  Will be compliant with appropriate progressive HEP  Baseline: Goal status: INITIAL  2.  Pain in B knees to be no more than 6/10  Baseline:  Goal status: INITIAL  3.  Will be able to name 3 way to reduce fall risk at home and in the community  Baseline:  Goal status: INITIAL   LONG TERM GOALS: Target date: 12/07/2021   MMT to improve by at least 1 grade in all weak groups  Baseline:  Goal status: INITIAL  2.  Will score at least 43 on the Berg to show improved balance/reduced fall risk  Baseline:  Goal status: INITIAL  3.  Pain in B knees to be no more than 4/10 at worst  Baseline:  Goal status: INITIAL  4.  Will be able to tolerate trips to the grocery store without increased pain in order to  improve community access  Baseline:  Goal status: INITIAL  5.  Will be compliant with gym based HEP to help maintain readiness for potential surgery  Baseline:  Goal status: INITIAL   PLAN: PT FREQUENCY: 2x/week  PT DURATION: 8 weeks  PLANNED INTERVENTIONS: Therapeutic exercises, Therapeutic activity, Neuromuscular re-education, Balance training, Gait training, Patient/Family education, Self Care, Joint mobilization, DME  instructions, Dry Needling, Electrical stimulation, Cryotherapy, Moist heat, Taping, Ultrasound, Ionotophoresis 6m/ml Dexamethasone, Manual therapy, and Re-evaluation  PLAN FOR NEXT SESSION: practice single step with RW every session, focus on general strengthening and balance/post-op needs     BElsie Ra PT, DPT 10/24/21 1:18 PM

## 2021-10-25 NOTE — Therapy (Signed)
OUTPATIENT PHYSICAL THERAPY TREATMENT NOTE   Patient Name: Chelsey Lee MRN: [id number] DOB:1955-02-21, 66 y.o., female Today's Date: 10/29/2021  END OF SESSION:   PT End of Session - 10/29/21 1045     Visit Number 4    Number of Visits 17    Date for PT Re-Evaluation 12/07/21    Authorization Type Medicare and BCBS    Authorization Time Period 10/12/21 to 12/07/21    Progress Note Due on Visit 10    PT Start Time 1023    PT Stop Time 1058    PT Time Calculation (min) 35 min    Activity Tolerance Patient tolerated treatment well    Behavior During Therapy [facility] for tasks assessed/performed              Past Medical History:  Diagnosis Date   Arthritis    Chronic kidney disease    stage 4   Diabetes mellitus    GERD (gastroesophageal reflux disease)    Hyperlipemia    Hypertension    Seasonal allergies    Sleep apnea    Past Surgical History:  Procedure Laterality Date   AV FISTULA PLACEMENT Left 04/10/2020   Procedure: LEFT ARM ARTERIOVENOUS (AV) FISTULA CREATION;  Surgeon: Lee Dutch, MD;  Location: [place];  Service: Vascular;  Laterality: Left;   CAPSULOTOMY  01/13/2012   Procedure: MINOR CAPSULOTOMY;  Surgeon: Chelsey Lee., MD;  Location: [place];  Service: Ophthalmology;  Laterality: Left;   DORSAL COMPARTMENT RELEASE  2009   rt    FISTULA SUPERFICIALIZATION Left 05/29/2020   Procedure: LEFT UPPER ARM FISTULA SUPERFICIALIZATION WITH LIGATION OF MULTIPLE SIDE BRANCHES;  Surgeon: Lee Dutch, MD;  Location: [place];  Service: Vascular;  Laterality: Left;   KNEE ARTHROSCOPY  02,04   right and left   STERIOD INJECTION  08/01/2011   Procedure: STEROID INJECTION;  Surgeon: Chelsey Lee., MD;  Location: [place];  Service: Orthopedics;  Laterality: Left;  cmc left thumb   TRIGGER FINGER RELEASE  2006   rt thumb   TRIGGER FINGER RELEASE  08/01/2011   Procedure: RELEASE TRIGGER FINGER/A-1 PULLEY;  Surgeon: Chelsey Lee., MD;  Location: [place];  Service: Orthopedics;  Laterality: Left;  left thumb   YAG LASER APPLICATION  25/42/7062   Procedure: YAG LASER APPLICATION;  Surgeon: Chelsey Lee., MD;  Location: [place];  Service: Ophthalmology;  Laterality: Left;   Patient Active Problem List   Diagnosis Date Noted   Pain in left hand 06/29/2021   Cerebrovascular disease 05/11/2020   Gout 03/09/2020   Allergic rhinitis 01/25/2020   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   Colon cancer screening 01/25/2020   Gastroesophageal reflux disease without esophagitis 01/25/2020   Hyperlipidemia 01/25/2020   Morbid obesity ([place]) 01/25/2020   Personal history of colonic polyps 01/25/2020   OSA (obstructive sleep apnea) 01/25/2020   Vitamin D deficiency 01/25/2020   Controlled type 2 diabetes mellitus without complication ([place]) 37/62/8315   Chronic kidney disease, stage 4 (severe) ([place]) 06/29/2019   Long term (current) use of insulin ([place]) 06/29/2019   Epidural lipomatosis 03/02/2019   Spondylolisthesis of lumbar region 03/02/2019   Spinal stenosis of lumbar region with neurogenic claudication 03/02/2019   Left wrist tendinitis 09/07/2018   Localized osteoarthritis of left knee 04/03/2018   Celiac disease 08/21/2016   Abnormal thyroid function test 04/01/2016   Pain in thumb joint with movement of right hand  09/14/2015   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   Moderate major depression, single episode ([place]) 02/02/2010   Diabetic renal disease ([place]) 06/07/2009   Anemia 01/18/2009   Essential hypertension 01/18/2009     THERAPY DIAG:  Chronic pain of right knee  Chronic pain of left knee  Muscle weakness (generalized)  Difficulty in walking, not elsewhere classified  Unsteadiness on feet   PCP: Chelsey Lee   REFERRING PROVIDER: Newt Minion, MD   REFERRING DIAG: [phone] (ICD-10-CM) - Primary osteoarthritis of left knee   Rationale for Evaluation and Treatment  Rehabilitation  ONSET DATE: 10/01/2021   SUBJECTIVE:   SUBJECTIVE STATEMENT: Pain in lateral left knee. She states that it started yesterday when the cold weather came through.   PERTINENT HISTORY: HPI: Patient is a 66 year old woman who presents in follow-up for osteoarthritis left knee.  Patient currently ambulates with a cane and has difficulty with activities of daily living due to knee arthritis.  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    PAIN:  Are you having pain? Yes: NPRS scale: 0/10 currently; at worst can get to 9/10 Pain location: both knees  Pain description: dull pain, audible grinding  Aggravating factors: bending knees to sit down, steps, going from sit to stand  Relieving factors: sitting down/getting weight off of LEs   PRECAUTIONS: None  WEIGHT BEARING RESTRICTIONS No  FALLS:  Has patient fallen in last 6 months? Yes. 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  Lives in: House/apartment Stairs: 1 STE no rails, 1 step inside the home  Has following equipment at home: Single point cane and toilet riser with rails   OCCUPATION: retired   PLOF: Independent, Independent with basic ADLs, Independent with gait, and Independent with transfers  PATIENT GOALS for knees not to hurt and be able to walk without a cane, improve balance    OBJECTIVE:    PATIENT SURVEYS:  Eval: FOTO 44  COGNITION: Overall cognitive status: Within functional limits for tasks assessed     SENSATION: Not tested but reports some numbness in both thighs for past 5-6 years does have hx of back pain/back issues   PALPATION: Medial R knee TTP   LOWER EXTREMITY ROM:  Active ROM Right eval  Left eval  Hip flexion    Hip extension    Hip abduction    Hip adduction    Hip internal rotation    Hip external rotation    Knee flexion A: 114 A: 108  Knee extension A: 3 with heel prop  A: -2 with heel prop   Ankle dorsiflexion    Ankle plantarflexion    Ankle inversion    Ankle eversion     (Blank rows = not tested)  LOWER EXTREMITY MMT:  MMT Right eval Left eval  Hip flexion 4- 4-  Hip extension    Hip abduction 3 3  Hip adduction    Hip internal rotation    Hip external rotation    Knee flexion 3+ 3  Knee extension 4+ 4+  Ankle dorsiflexion 5 5  Ankle plantarflexion    Ankle inversion    Ankle eversion     (Blank rows = not tested)  FUNCTIONAL TESTS:  Eval: Berg Balance  Scale: 33/56  GAIT: Distance walked: in clinic distances  Assistive device utilized: Single point cane Level of assistance: Modified independence Comments: wide BOS, B LEs externally rotated, antalgic gait pattern, trunk flexed     TODAY'S TREATMENT: 10/29/21 Therex NuStep L4 x 4 min UE/LE Seated LAQ with 2# 2 x 10 reps bil Seated marches 2 X 10 bilat Seated Hamstring curls with L3 band 2 x 10 reps bil Seated hip abduction x 20 reps with L3 band Sit to stand x5 from high- low table with use of UE's.  Seated quad set 5 sec X10  Gait Training: Amb with RW with min cues for sequencing and expectations post op; negotiated 6.5" curb with RW needing min A and min cues for sequencing  10/24/21 Therex NuStep L4 x 6 min UE/LE Seated LAQ with 2# 2 x 10 reps bil Seated marches X 10 bilat Seated Hamstring curls with L2 band 2 x 10 reps bil Seated hip abduction x 15 reps with L2 band Seated quad set 5 sec X10  Gait Training: Amb with RW with min cues for sequencing and expectations post op; negotiated 6.5" curb with RW needing min A and min cues for sequencing  10/15/21: TherEx NuStep L4 x 5 min Seated LAQ with L2 band x 10 reps bil Hamstring curls with L2 band x 10 reps bil Seated  hip abduction x 10 reps with L2 band Quad set bil 10 x 5 sec hold Supine heelslides x 10 reps  Gait Training: Amb with RW with min cues for sequencing and expectations post op; negotiated 6" curb with RW needing min A and min cues for sequencing  EVAL: LAQs x5 B red TB Seated clamshells x5 red TB HS curls x5 B red TB    PATIENT EDUCATION:  Education details: HEP, exam findings, POC  Person educated: Patient Education method: Explanation, Demonstration, and Handouts Education comprehension: verbalized understanding and returned demonstration   HOME EXERCISE PROGRAM: [email or web address]  ASSESSMENT:  CLINICAL IMPRESSION: Continued efforts to work to improve overall bilat knee strength within her tolerance. She fatigues easy and needs rest breaks. Challenged pt with sit to stands today with increased effort noted. Pt reports slight increase in lateral knee pain after. Encouraged pt to use heat once she is home if needed. PT recommending to continue current plan or care.  OBJECTIVE IMPAIRMENTS Abnormal gait, decreased balance, decreased knowledge of use of DME, decreased mobility, difficulty walking, decreased ROM, decreased strength, decreased safety awareness, hypomobility, obesity, and pain.   ACTIVITY LIMITATIONS standing, squatting, stairs, transfers, bed mobility, locomotion level, and caring for others  PARTICIPATION LIMITATIONS: cleaning, laundry, shopping, community activity, and yard work  PERSONAL FACTORS Age, Behavior pattern, Fitness, Past/current experiences, Social background, and Time since onset of injury/illness/exacerbation are also affecting patient's functional outcome.   REHAB POTENTIAL: Good  CLINICAL DECISION MAKING: Stable/uncomplicated  EVALUATION COMPLEXITY: Low   GOALS: Goals reviewed with patient? Yes  SHORT TERM GOALS: Target date: 11/09/2021  Will be compliant with appropriate progressive HEP  Baseline: Goal status: INITIAL  2.  Pain in B knees to be  no more than 6/10  Baseline:  Goal status: INITIAL  3.  Will be able to name 3 way to reduce fall risk at home and in the community  Baseline:  Goal status: INITIAL   LONG TERM GOALS: Target date: 12/07/2021   MMT to improve by at least 1 grade in all weak groups  Baseline:  Goal status: INITIAL  2.  Will score at  least 43 on the Berg to show improved balance/reduced fall risk  Baseline:  Goal status: INITIAL  3.  Pain in B knees to be no more than 4/10 at worst  Baseline:  Goal status: INITIAL  4.  Will be able to tolerate trips to the grocery store without increased pain in order to improve community access  Baseline:  Goal status: INITIAL  5.  Will be compliant with gym based HEP to help maintain readiness for potential surgery  Baseline:  Goal status: INITIAL   PLAN: PT FREQUENCY: 2x/week  PT DURATION: 8 weeks  PLANNED INTERVENTIONS: Therapeutic exercises, Therapeutic activity, Neuromuscular re-education, Balance training, Gait training, Patient/Family education, Self Care, Joint mobilization, DME instructions, Dry Needling, Electrical stimulation, Cryotherapy, Moist heat, Taping, Ultrasound, Ionotophoresis 49m/ml Dexamethasone, Manual therapy, and Re-evaluation  PLAN FOR NEXT SESSION: practice single step with RW every session, focus on general strengthening and balance/post-op needs    SRudi HeapPT, DPT 10/29/21  10:58 AM

## 2021-10-29 ENCOUNTER — Encounter: Payer: Self-pay | Admitting: Physical Therapy

## 2021-10-29 ENCOUNTER — Ambulatory Visit (INDEPENDENT_AMBULATORY_CARE_PROVIDER_SITE_OTHER): Payer: Medicare Other | Admitting: Physical Therapy

## 2021-10-29 DIAGNOSIS — M25561 Pain in right knee: Secondary | ICD-10-CM

## 2021-10-29 DIAGNOSIS — M6281 Muscle weakness (generalized): Secondary | ICD-10-CM

## 2021-10-29 DIAGNOSIS — M25562 Pain in left knee: Secondary | ICD-10-CM | POA: Diagnosis not present

## 2021-10-29 DIAGNOSIS — R262 Difficulty in walking, not elsewhere classified: Secondary | ICD-10-CM | POA: Diagnosis not present

## 2021-10-29 DIAGNOSIS — R2681 Unsteadiness on feet: Secondary | ICD-10-CM

## 2021-10-29 DIAGNOSIS — G8929 Other chronic pain: Secondary | ICD-10-CM

## 2021-11-02 ENCOUNTER — Ambulatory Visit (INDEPENDENT_AMBULATORY_CARE_PROVIDER_SITE_OTHER): Payer: Medicare Other | Admitting: Physical Therapy

## 2021-11-02 ENCOUNTER — Encounter: Payer: Self-pay | Admitting: Physical Therapy

## 2021-11-02 DIAGNOSIS — M25562 Pain in left knee: Secondary | ICD-10-CM

## 2021-11-02 DIAGNOSIS — R2681 Unsteadiness on feet: Secondary | ICD-10-CM

## 2021-11-02 DIAGNOSIS — M6281 Muscle weakness (generalized): Secondary | ICD-10-CM

## 2021-11-02 DIAGNOSIS — G8929 Other chronic pain: Secondary | ICD-10-CM

## 2021-11-02 DIAGNOSIS — R262 Difficulty in walking, not elsewhere classified: Secondary | ICD-10-CM

## 2021-11-02 DIAGNOSIS — M25561 Pain in right knee: Secondary | ICD-10-CM | POA: Diagnosis not present

## 2021-11-02 NOTE — Therapy (Signed)
OUTPATIENT PHYSICAL THERAPY TREATMENT NOTE   Patient Name: Chelsey Lee MRN: [id number] DOB:1955-09-25, 66 y.o., female Today's Date: 11/02/2021  END OF SESSION:   PT End of Session - 11/02/21 1144     Visit Number 5    Number of Visits 17    Date for PT Re-Evaluation 12/07/21    Authorization Type Medicare and BCBS    Authorization Time Period 10/12/21 to 12/07/21    Progress Note Due on Visit 10    PT Start Time 1101    PT Stop Time 1143    PT Time Calculation (min) 42 min    Activity Tolerance Patient tolerated treatment well    Behavior During Therapy [facility] for tasks assessed/performed               Past Medical History:  Diagnosis Date   Arthritis    Chronic kidney disease    stage 4   Diabetes mellitus    GERD (gastroesophageal reflux disease)    Hyperlipemia    Hypertension    Seasonal allergies    Sleep apnea    Past Surgical History:  Procedure Laterality Date   AV FISTULA PLACEMENT Left 04/10/2020   Procedure: LEFT ARM ARTERIOVENOUS (AV) FISTULA CREATION;  Surgeon: Elam Dutch, MD;  Location: [place];  Service: Vascular;  Laterality: Left;   CAPSULOTOMY  01/13/2012   Procedure: MINOR CAPSULOTOMY;  Surgeon: Myrtha Mantis., MD;  Location: [place];  Service: Ophthalmology;  Laterality: Left;   DORSAL COMPARTMENT RELEASE  2009   rt    FISTULA SUPERFICIALIZATION Left 05/29/2020   Procedure: LEFT UPPER ARM FISTULA SUPERFICIALIZATION WITH LIGATION OF MULTIPLE SIDE BRANCHES;  Surgeon: Elam Dutch, MD;  Location: [place];  Service: Vascular;  Laterality: Left;   KNEE ARTHROSCOPY  02,04   right and left   STERIOD INJECTION  08/01/2011   Procedure: STEROID INJECTION;  Surgeon: Cammie Sickle., MD;  Location: [place];  Service: Orthopedics;  Laterality: Left;  cmc left thumb   TRIGGER FINGER RELEASE  2006   rt thumb   TRIGGER FINGER RELEASE  08/01/2011   Procedure: RELEASE TRIGGER FINGER/A-1 PULLEY;  Surgeon: Cammie Sickle., MD;  Location: [place];  Service: Orthopedics;  Laterality: Left;  left thumb   YAG LASER APPLICATION  08/65/7846   Procedure: YAG LASER APPLICATION;  Surgeon: Myrtha Mantis., MD;  Location: [place];  Service: Ophthalmology;  Laterality: Left;   Patient Active Problem List   Diagnosis Date Noted   Pain in left hand 06/29/2021   Cerebrovascular disease 05/11/2020   Gout 03/09/2020   Allergic rhinitis 01/25/2020   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   Colon cancer screening 01/25/2020   Gastroesophageal reflux disease without esophagitis 01/25/2020   Hyperlipidemia 01/25/2020   Morbid obesity ([place]) 01/25/2020   Personal history of colonic polyps 01/25/2020   OSA (obstructive sleep apnea) 01/25/2020   Vitamin D deficiency 01/25/2020   Controlled type 2 diabetes mellitus without complication ([place]) 96/29/5284   Chronic kidney disease, stage 4 (severe) ([place]) 06/29/2019   Long term (current) use of insulin ([place]) 06/29/2019   Epidural lipomatosis 03/02/2019   Spondylolisthesis of lumbar region 03/02/2019   Spinal stenosis of lumbar region with neurogenic claudication 03/02/2019   Left wrist tendinitis 09/07/2018   Localized osteoarthritis of left knee 04/03/2018   Celiac disease 08/21/2016   Abnormal thyroid function test 04/01/2016   Pain in thumb joint with movement of right  hand 09/14/2015   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   Moderate major depression, single episode ([place]) 02/02/2010   Diabetic renal disease ([place]) 06/07/2009   Anemia 01/18/2009   Essential hypertension 01/18/2009     THERAPY DIAG:  Chronic pain of right knee  Chronic pain of left knee  Muscle weakness (generalized)  Difficulty in walking, not elsewhere classified  Unsteadiness on feet   PCP: Willey Blade   REFERRING PROVIDER: Newt Minion, MD   REFERRING DIAG: [phone] (ICD-10-CM) - Primary osteoarthritis of left knee   Rationale for Evaluation and Treatment  Rehabilitation  ONSET DATE: 10/01/2021   SUBJECTIVE:   SUBJECTIVE STATEMENT: Feels like she's getting stronger, Lt knee hurts when walking but planning to get Rt knee replaced  PERTINENT HISTORY: HPI: Patient is a 66 year old woman who presents in follow-up for osteoarthritis left knee.  Patient currently ambulates with a cane and has difficulty with activities of daily living due to knee arthritis.  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    PAIN:  Are you having pain? Yes: NPRS scale: 0/10 currently; at worst can get to 9/10 Pain location: both knees  Pain description: dull pain, audible grinding  Aggravating factors: bending knees to sit down, steps, going from sit to stand  Relieving factors: sitting down/getting weight off of LEs   PRECAUTIONS: None  WEIGHT BEARING RESTRICTIONS No  FALLS:  Has patient fallen in last 6 months? Yes. 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  Lives in: House/apartment Stairs: 1 STE no rails, 1 step inside the home  Has following equipment at home: Single point cane and toilet riser with rails   OCCUPATION: retired   PLOF: Independent, Independent with basic ADLs, Independent with gait, and Independent with transfers  [place] for knees not to hurt and be able to walk without a cane, improve balance    OBJECTIVE:    PATIENT SURVEYS:  Eval: FOTO 44  COGNITION: Overall cognitive status: Within functional limits for tasks assessed     SENSATION: Not tested but reports some numbness in both thighs for past 5-6 years does have hx of back pain/back issues   PALPATION: Medial R knee TTP   LOWER EXTREMITY ROM:  Active ROM Right eval Left eval   Knee flexion A: 114 A: 108  Knee extension A: 3 with heel prop  A: -2 with heel prop    (Blank rows = not tested)  LOWER EXTREMITY MMT:  MMT Right eval Left eval  Hip flexion 4- 4-  Hip extension    Hip abduction 3 3  Hip adduction    Hip internal rotation    Hip external rotation    Knee flexion 3+ 3  Knee extension 4+ 4+  Ankle dorsiflexion 5 5  Ankle plantarflexion    Ankle inversion    Ankle eversion     (Blank rows = not tested)  FUNCTIONAL TESTS:  Eval: Berg Balance Scale: 33/56  GAIT: Distance walked: in clinic distances  Assistive device utilized: Single point cane Level of assistance: Modified independence Comments: wide BOS, B LEs externally rotated, antalgic gait pattern, trunk flexed     TODAY'S TREATMENT: 11/02/21 TherEx NuStep L6 x 6 min UE/LE Bil LAQ 2x10; 3# Bil  marching 3# 2x10 bil Sit to/from stand x 10 reps; elevated mat still needing UE support Seated tricep press x10 with hands on arm rests Standing hip abduction x 10 reps bil Standing hip extension x 10 reps bil  Gait Training: Amb with RW with min cues for sequencing and expectations post op; negotiated 6.5" curb with RW needing min A and min cues for sequencing (pt did curb ascending forward; feel this may be a challenge post op and continue to recommend backwards to ascend) Pt prefers wide RW at this time  10/29/21 Therex NuStep L4 x 4 min UE/LE Seated LAQ with 2# 2 x 10 reps bil Seated marches 2 X 10 bilat Seated Hamstring curls with L3 band 2 x 10 reps bil Seated hip abduction x 20 reps with L3 band Sit to stand x5 from high- low table with use of UE's.  Seated quad set 5 sec X10  Gait Training: Amb with RW with min cues for sequencing and expectations post op; negotiated 6.5" curb with RW needing min A and min cues for sequencing  10/24/21 Therex NuStep L4 x 6 min UE/LE Seated LAQ with 2# 2 x 10 reps bil Seated marches X 10 bilat Seated Hamstring curls with L2 band 2 x 10  reps bil Seated hip abduction x 15 reps with L2 band Seated quad set 5 sec X10  Gait Training: Amb with RW with min cues for sequencing and expectations post op; negotiated 6.5" curb with RW needing min A and min cues for sequencing  PATIENT EDUCATION:  Education details: HEP, exam findings, POC  Person educated: Patient Education method: Explanation, Demonstration, and Handouts Education comprehension: verbalized understanding and returned demonstration   HOME EXERCISE PROGRAM: Access Code: [email or web address] URL: [email or web address] Date: 11/02/2021 Prepared by: Faustino Congress  Exercises - Sitting Knee Extension with Resistance  - 2 x daily - 7 x weekly - 1 sets - 10 reps - 2 hold - Seated Hamstring Curls with Resistance  - 2 x daily - 7 x weekly - 1 sets - 10 reps - 2 hold - Seated Hip Abduction with Resistance  - 2 x daily - 7 x weekly - 1 sets - 10 reps - 2 hold - Long Sitting Quad Set  - 3 x daily - 7 x weekly - 1 sets - 10 reps - 5 sec hold - Supine Heel Slide  - 1 x daily - 7 x weekly - 1 sets - 10 reps - Seated Chair Push Ups  - 2-3 x daily - 7 x weekly - 1 sets - 5-10 reps  ASSESSMENT:  CLINICAL IMPRESSION: Pt tolerated session well today with continued work on post op expectations as well as strengthening.  Will continue to benefit from PT to maximize function.  OBJECTIVE IMPAIRMENTS Abnormal gait, decreased balance, decreased knowledge of use of DME, decreased mobility, difficulty walking, decreased ROM, decreased strength, decreased safety awareness, hypomobility, obesity, and pain.   ACTIVITY LIMITATIONS standing, squatting, stairs, transfers, bed mobility, locomotion level, and caring for others  PARTICIPATION LIMITATIONS: cleaning, laundry, shopping, community activity, and yard work  PERSONAL FACTORS Age, Behavior pattern, Fitness, Past/current experiences, Social background, and Time since onset of injury/illness/exacerbation are also affecting  patient's functional outcome.   REHAB POTENTIAL: Good  CLINICAL DECISION MAKING: Stable/uncomplicated  EVALUATION COMPLEXITY: Low   GOALS: Goals reviewed with patient? Yes  SHORT TERM GOALS: Target date: 11/09/2021  Will be compliant with appropriate progressive HEP  Baseline: Goal status: INITIAL  2.  Pain in  B knees to be no more than 6/10  Baseline:  Goal status: INITIAL  3.  Will be able to name 3 way to reduce fall risk at home and in the community  Baseline:  Goal status: INITIAL   LONG TERM GOALS: Target date: 12/07/2021   MMT to improve by at least 1 grade in all weak groups  Baseline:  Goal status: INITIAL  2.  Will score at least 43 on the Berg to show improved balance/reduced fall risk  Baseline:  Goal status: INITIAL  3.  Pain in B knees to be no more than 4/10 at worst  Baseline:  Goal status: INITIAL  4.  Will be able to tolerate trips to the grocery store without increased pain in order to improve community access  Baseline:  Goal status: INITIAL  5.  Will be compliant with gym based HEP to help maintain readiness for potential surgery  Baseline:  Goal status: INITIAL   PLAN: PT FREQUENCY: 2x/week  PT DURATION: 8 weeks  PLANNED INTERVENTIONS: Therapeutic exercises, Therapeutic activity, Neuromuscular re-education, Balance training, Gait training, Patient/Family education, Self Care, Joint mobilization, DME instructions, Dry Needling, Electrical stimulation, Cryotherapy, Moist heat, Taping, Ultrasound, Ionotophoresis 41m/ml Dexamethasone, Manual therapy, and Re-evaluation  PLAN FOR NEXT SESSION: check STGs, practice single step with RW every session, focus on general strengthening and balance/post-op needs    SLaureen Abrahams PT, DPT 11/02/21 11:45 AM

## 2021-11-05 ENCOUNTER — Encounter: Payer: Self-pay | Admitting: Physical Therapy

## 2021-11-05 ENCOUNTER — Ambulatory Visit (INDEPENDENT_AMBULATORY_CARE_PROVIDER_SITE_OTHER): Payer: Medicare Other | Admitting: Physical Therapy

## 2021-11-05 DIAGNOSIS — M25561 Pain in right knee: Secondary | ICD-10-CM | POA: Diagnosis not present

## 2021-11-05 DIAGNOSIS — R262 Difficulty in walking, not elsewhere classified: Secondary | ICD-10-CM | POA: Diagnosis not present

## 2021-11-05 DIAGNOSIS — M25562 Pain in left knee: Secondary | ICD-10-CM | POA: Diagnosis not present

## 2021-11-05 DIAGNOSIS — R2681 Unsteadiness on feet: Secondary | ICD-10-CM

## 2021-11-05 DIAGNOSIS — M6281 Muscle weakness (generalized): Secondary | ICD-10-CM

## 2021-11-05 DIAGNOSIS — G8929 Other chronic pain: Secondary | ICD-10-CM

## 2021-11-05 NOTE — Therapy (Signed)
OUTPATIENT PHYSICAL THERAPY TREATMENT NOTE   Patient Name: Chelsey Lee MRN: [id number] DOB:[birth year], 66 y.o., female Today's Date: 11/05/2021  END OF SESSION:   PT End of Session - 11/05/21 1140     Visit Number 6    Number of Visits 17    Date for PT Re-Evaluation 12/07/21    Authorization Type Medicare and BCBS    Authorization Time Period 10/12/21 to 12/07/21    Progress Note Due on Visit 10    PT Start Time 6295    PT Stop Time 1222    PT Time Calculation (min) 38 min    Activity Tolerance Patient tolerated treatment well    Behavior During Therapy WFL for tasks assessed/performed               Past Medical History:  Diagnosis Date   Arthritis    Chronic kidney disease    stage 4   Diabetes mellitus    GERD (gastroesophageal reflux disease)    Hyperlipemia    Hypertension    Seasonal allergies    Sleep apnea    Past Surgical History:  Procedure Laterality Date   AV FISTULA PLACEMENT Left 04/10/2020   Procedure: LEFT ARM ARTERIOVENOUS (AV) FISTULA CREATION;  Surgeon: Elam Dutch, MD;  Location: [place];  Service: Vascular;  Laterality: Left;   CAPSULOTOMY  01/13/2012   Procedure: MINOR CAPSULOTOMY;  Surgeon: Myrtha Mantis., MD;  Location: [place];  Service: Ophthalmology;  Laterality: Left;   DORSAL COMPARTMENT RELEASE  2009   rt    FISTULA SUPERFICIALIZATION Left 05/29/2020   Procedure: LEFT UPPER ARM FISTULA SUPERFICIALIZATION WITH LIGATION OF MULTIPLE SIDE BRANCHES;  Surgeon: Elam Dutch, MD;  Location: [place];  Service: Vascular;  Laterality: Left;   KNEE ARTHROSCOPY  02,04   right and left   STERIOD INJECTION  08/01/2011   Procedure: STEROID INJECTION;  Surgeon: Cammie Sickle., MD;  Location: [facility];  Service: Orthopedics;  Laterality: Left;  cmc left thumb   TRIGGER FINGER RELEASE  2006   rt thumb   TRIGGER FINGER RELEASE  08/01/2011   Procedure: RELEASE TRIGGER FINGER/A-1 PULLEY;  Surgeon: Cammie Sickle., MD;  Location: [facility];  Service: Orthopedics;  Laterality: Left;  left thumb   YAG LASER APPLICATION  28/41/3244   Procedure: YAG LASER APPLICATION;  Surgeon: Myrtha Mantis., MD;  Location: [place];  Service: Ophthalmology;  Laterality: Left;   Patient Active Problem List   Diagnosis Date Noted   Pain in left hand 06/29/2021   Cerebrovascular disease 05/11/2020   Gout 03/09/2020   Allergic rhinitis 01/25/2020   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   Colon cancer screening 01/25/2020   Gastroesophageal reflux disease without esophagitis 01/25/2020   Hyperlipidemia 01/25/2020   Morbid obesity ([place]) 01/25/2020   Personal history of colonic polyps 01/25/2020   OSA (obstructive sleep apnea) 01/25/2020   Vitamin D deficiency 01/25/2020   Controlled type 2 diabetes mellitus without complication ([place]) 01/23/7251   Chronic kidney disease, stage 4 (severe) ([place]) 06/29/2019   Long term (current) use of insulin ([place]) 06/29/2019   Epidural lipomatosis 03/02/2019   Spondylolisthesis of lumbar region 03/02/2019   Spinal stenosis of lumbar region with neurogenic claudication 03/02/2019   Left wrist tendinitis 09/07/2018   Localized osteoarthritis of left knee 04/03/2018   Celiac disease 08/21/2016   Abnormal thyroid function test 04/01/2016   Pain in thumb joint with movement of right  hand 09/14/2015   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   Moderate major depression, single episode ([place]) 02/02/2010   Diabetic renal disease ([place]) 06/07/2009   Anemia 01/18/2009   Essential hypertension 01/18/2009     THERAPY DIAG:  Chronic pain of right knee  Chronic pain of left knee  Muscle weakness (generalized)  Difficulty in walking, not elsewhere classified  Unsteadiness on feet   PCP: Willey Blade   REFERRING PROVIDER: Newt Minion, MD   REFERRING DIAG: [phone] (ICD-10-CM) - Primary osteoarthritis of left knee   Rationale for Evaluation and Treatment  Rehabilitation  ONSET DATE: 10/01/2021   SUBJECTIVE:   SUBJECTIVE STATEMENT: Feels like she has some swelling in her knees today, Feels her left knee is stronger than her Rt  PERTINENT HISTORY: HPI: Patient is a 66 year old woman who presents in follow-up for osteoarthritis left knee.  Patient currently ambulates with a cane and has difficulty with activities of daily living due to knee arthritis.  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    PAIN:  Are you having pain? Yes: NPRS scale: 6/10 currently Pain location: both knees  Pain description: dull pain, audible grinding  Aggravating factors: bending knees to sit down, steps, going from sit to stand  Relieving factors: sitting down/getting weight off of LEs   PRECAUTIONS: None  WEIGHT BEARING RESTRICTIONS No  FALLS:  Has patient fallen in last 6 months? Yes. 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  Lives in: House/apartment Stairs: 1 STE no rails, 1 step inside the home  Has following equipment at home: Single point cane and toilet riser with rails   OCCUPATION: retired   PLOF: Independent, Independent with basic ADLs, Independent with gait, and Independent with transfers  [place] for knees not to hurt and be able to walk without a cane, improve balance    OBJECTIVE:    PATIENT SURVEYS:  Eval: FOTO 44  COGNITION: Overall cognitive status: Within functional limits for tasks assessed     SENSATION: Not tested but reports some numbness in both thighs for past 5-6 years does have hx of back pain/back issues   PALPATION: Medial R knee TTP   LOWER EXTREMITY ROM:  Active ROM Right eval Left eval  Knee flexion A: 114 A: 108   Knee extension A: 3 with heel prop  A: -2 with heel prop    (Blank rows = not tested)  LOWER EXTREMITY MMT:  MMT Right eval Left eval  Hip flexion 4- 4-  Hip extension    Hip abduction 3 3  Hip adduction    Hip internal rotation    Hip external rotation    Knee flexion 3+ 3  Knee extension 4+ 4+  Ankle dorsiflexion 5 5  Ankle plantarflexion    Ankle inversion    Ankle eversion     (Blank rows = not tested)  FUNCTIONAL TESTS:  Eval: Berg Balance Scale: 33/56  GAIT: Distance walked: in clinic distances  Assistive device utilized: Single point cane Level of assistance: Modified independence Comments: wide BOS, B LEs externally rotated, antalgic gait pattern, trunk flexed     TODAY'S TREATMENT: 11/05/21 TherEx NuStep L6 x 6 min UE/LE Bil LAQ 2x10; 3# Bil marching 3# 2x10 bil  Sit to/from stand x 10 reps; elevated mat still needing UE support Standing hamstring curl X 10 bilat Standing hip abduction x 10 reps bil Standing hip extension x 10 reps bil  Gait with RW stepping up one 6.5 inch step step with review on technique.  11/02/21 TherEx NuStep L6 x 6 min UE/LE Bil LAQ 2x10; 3# Bil marching 3# 2x10 bil Sit to/from stand x 10 reps; elevated mat still needing UE support Seated tricep press x10 with hands on arm rests Standing hip abduction x 10 reps bil Standing hip extension x 10 reps bil     PATIENT EDUCATION:  Education details: HEP, exam findings, POC  Person educated: Patient Education method: Explanation, Demonstration, and Handouts Education comprehension: verbalized understanding and returned demonstration   HOME EXERCISE PROGRAM: Access Code: [email or web address] URL: [email or web address] Date: 11/02/2021 Prepared by: Faustino Congress  Exercises - Sitting Knee Extension with Resistance  - 2 x daily - 7 x weekly - 1 sets - 10 reps - 2 hold - Seated Hamstring Curls with Resistance  - 2 x daily - 7 x weekly - 1 sets - 10 reps - 2  hold - Seated Hip Abduction with Resistance  - 2 x daily - 7 x weekly - 1 sets - 10 reps - 2 hold - Long Sitting Quad Set  - 3 x daily - 7 x weekly - 1 sets - 10 reps - 5 sec hold - Supine Heel Slide  - 1 x daily - 7 x weekly - 1 sets - 10 reps - Seated Chair Push Ups  - 2-3 x daily - 7 x weekly - 1 sets - 5-10 reps  ASSESSMENT:  CLINICAL IMPRESSION: Session focused on overall knee strength and ROM as tolerated. Continued to review and have her return demonstrate how to navigate a step after surgery using RW. She will continue to benefit from more work in these areas. We will check STG's next session.  OBJECTIVE IMPAIRMENTS Abnormal gait, decreased balance, decreased knowledge of use of DME, decreased mobility, difficulty walking, decreased ROM, decreased strength, decreased safety awareness, hypomobility, obesity, and pain.   ACTIVITY LIMITATIONS standing, squatting, stairs, transfers, bed mobility, locomotion level, and caring for others  PARTICIPATION LIMITATIONS: cleaning, laundry, shopping, community activity, and yard work  PERSONAL FACTORS Age, Behavior pattern, Fitness, Past/current experiences, Social background, and Time since onset of injury/illness/exacerbation are also affecting patient's functional outcome.   REHAB POTENTIAL: Good  CLINICAL DECISION MAKING: Stable/uncomplicated  EVALUATION COMPLEXITY: Low   GOALS: Goals reviewed with patient? Yes  SHORT TERM GOALS: Target date: 11/09/2021  Will be compliant with appropriate progressive HEP  Baseline: Goal status: INITIAL  2.  Pain in B knees to be no more than 6/10  Baseline:  Goal status: INITIAL  3.  Will be able to name 3 way to reduce fall risk at home and in the community  Baseline:  Goal status: INITIAL   LONG TERM GOALS: Target date: 12/07/2021   MMT to improve by at least 1 grade in all weak groups  Baseline:  Goal status: INITIAL  2.  Will score at least 43 on the Berg to show improved  balance/reduced fall risk  Baseline:  Goal status: INITIAL  3.  Pain in B knees to be no more than 4/10 at worst  Baseline:  Goal status: INITIAL  4.  Will be able to tolerate trips to the grocery store without increased pain in order to improve community access  Baseline:  Goal status: INITIAL  5.  Will be compliant with gym based HEP to help maintain readiness for potential surgery  Baseline:  Goal status: INITIAL   PLAN: PT FREQUENCY: 2x/week  PT DURATION: 8 weeks  PLANNED INTERVENTIONS: Therapeutic exercises, Therapeutic activity, Neuromuscular re-education, Balance training, Gait training, Patient/Family education, Self Care, Joint mobilization, DME instructions, Dry Needling, Electrical stimulation, Cryotherapy, Moist heat, Taping, Ultrasound, Ionotophoresis 67m/ml Dexamethasone, Manual therapy, and Re-evaluation  PLAN FOR NEXT SESSION: check STGs, practice single step with RW every session, focus on general strengthening and balance/post-op needs   BElsie Ra PT, DPT 11/05/21 12:17 PM

## 2021-11-07 ENCOUNTER — Ambulatory Visit (INDEPENDENT_AMBULATORY_CARE_PROVIDER_SITE_OTHER): Payer: Medicare Other | Admitting: Physical Therapy

## 2021-11-07 ENCOUNTER — Encounter: Payer: Self-pay | Admitting: Physical Therapy

## 2021-11-07 DIAGNOSIS — M6281 Muscle weakness (generalized): Secondary | ICD-10-CM

## 2021-11-07 DIAGNOSIS — M25561 Pain in right knee: Secondary | ICD-10-CM | POA: Diagnosis not present

## 2021-11-07 DIAGNOSIS — G8929 Other chronic pain: Secondary | ICD-10-CM

## 2021-11-07 DIAGNOSIS — R262 Difficulty in walking, not elsewhere classified: Secondary | ICD-10-CM | POA: Diagnosis not present

## 2021-11-07 DIAGNOSIS — M25562 Pain in left knee: Secondary | ICD-10-CM

## 2021-11-07 DIAGNOSIS — R2681 Unsteadiness on feet: Secondary | ICD-10-CM

## 2021-11-07 NOTE — Therapy (Signed)
OUTPATIENT PHYSICAL THERAPY TREATMENT NOTE   Patient Name: Chelsey Lee MRN: [id number] DOB:02/09/1955, 66 y.o., female Today's Date: 11/07/2021  END OF SESSION:   PT End of Session - 11/07/21 1144     Visit Number 7    Number of Visits 17    Date for PT Re-Evaluation 12/07/21    Authorization Type Medicare and BCBS    Authorization Time Period 10/12/21 to 12/07/21    Progress Note Due on Visit 10    PT Start Time 1140    PT Stop Time 1218    PT Time Calculation (min) 38 min    Activity Tolerance Patient tolerated treatment well    Behavior During Therapy [facility] for tasks assessed/performed               Past Medical History:  Diagnosis Date   Arthritis    Chronic kidney disease    stage 4   Diabetes mellitus    GERD (gastroesophageal reflux disease)    Hyperlipemia    Hypertension    Seasonal allergies    Sleep apnea    Past Surgical History:  Procedure Laterality Date   AV FISTULA PLACEMENT Left 04/10/2020   Procedure: LEFT ARM ARTERIOVENOUS (AV) FISTULA CREATION;  Surgeon: Elam Dutch, MD;  Location: [place];  Service: Vascular;  Laterality: Left;   CAPSULOTOMY  01/13/2012   Procedure: MINOR CAPSULOTOMY;  Surgeon: Myrtha Mantis., MD;  Location: [facility];  Service: Ophthalmology;  Laterality: Left;   DORSAL COMPARTMENT RELEASE  2009   rt    FISTULA SUPERFICIALIZATION Left 05/29/2020   Procedure: LEFT UPPER ARM FISTULA SUPERFICIALIZATION WITH LIGATION OF MULTIPLE SIDE BRANCHES;  Surgeon: Elam Dutch, MD;  Location: [facility];  Service: Vascular;  Laterality: Left;   KNEE ARTHROSCOPY  02,04   right and left   STERIOD INJECTION  08/01/2011   Procedure: STEROID INJECTION;  Surgeon: Cammie Sickle., MD;  Location: [facility];  Service: Orthopedics;  Laterality: Left;  cmc left thumb   TRIGGER FINGER RELEASE  2006   rt thumb   TRIGGER FINGER RELEASE  08/01/2011   Procedure: RELEASE TRIGGER FINGER/A-1 PULLEY;  Surgeon: Cammie Sickle., MD;  Location: [facility];  Service: Orthopedics;  Laterality: Left;  left thumb   YAG LASER APPLICATION  78/93/8101   Procedure: YAG LASER APPLICATION;  Surgeon: Myrtha Mantis., MD;  Location: [facility];  Service: Ophthalmology;  Laterality: Left;   Patient Active Problem List   Diagnosis Date Noted   Pain in left hand 06/29/2021   Cerebrovascular disease 05/11/2020   Gout 03/09/2020   Allergic rhinitis 01/25/2020   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   Colon cancer screening 01/25/2020   Gastroesophageal reflux disease without esophagitis 01/25/2020   Hyperlipidemia 01/25/2020   Morbid obesity ([place]) 01/25/2020   Personal history of colonic polyps 01/25/2020   OSA (obstructive sleep apnea) 01/25/2020   Vitamin D deficiency 01/25/2020   Controlled type 2 diabetes mellitus without complication ([place]) 75/10/2583   Chronic kidney disease, stage 4 (severe) ([place]) 06/29/2019   Long term (current) use of insulin ([place]) 06/29/2019   Epidural lipomatosis 03/02/2019   Spondylolisthesis of lumbar region 03/02/2019   Spinal stenosis of lumbar region with neurogenic claudication 03/02/2019   Left wrist tendinitis 09/07/2018   Localized osteoarthritis of left knee 04/03/2018   Celiac disease 08/21/2016   Abnormal thyroid function test 04/01/2016   Pain in thumb joint with movement of right  hand 09/14/2015   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   Moderate major depression, single episode ([place]) 02/02/2010   Diabetic renal disease ([place]) 06/07/2009   Anemia 01/18/2009   Essential hypertension 01/18/2009     THERAPY DIAG:  Chronic pain of right knee  Chronic pain of left knee  Muscle weakness (generalized)  Difficulty in walking, not elsewhere classified  Unsteadiness on feet   PCP: Willey Blade   REFERRING PROVIDER: Newt Minion, MD   REFERRING DIAG: [phone] (ICD-10-CM) - Primary osteoarthritis of left knee   Rationale for Evaluation and Treatment  Rehabilitation  ONSET DATE: 10/01/2021   SUBJECTIVE:   SUBJECTIVE STATEMENT: She was really sore after last session with more overall pain  PERTINENT HISTORY: HPI: Patient is a 66 year old woman who presents in follow-up for osteoarthritis left knee.  Patient currently ambulates with a cane and has difficulty with activities of daily living due to knee arthritis.  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    PAIN:  Are you having pain? Yes: NPRS scale: 8/10 currently Pain location: both knees  Pain description: dull pain, audible grinding  Aggravating factors: bending knees to sit down, steps, going from sit to stand  Relieving factors: sitting down/getting weight off of LEs   PRECAUTIONS: None  WEIGHT BEARING RESTRICTIONS No  FALLS:  Has patient fallen in last 6 months? Yes. 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  Lives in: House/apartment Stairs: 1 STE no rails, 1 step inside the home  Has following equipment at home: Single point cane and toilet riser with rails   OCCUPATION: retired   PLOF: Independent, Independent with basic ADLs, Independent with gait, and Independent with transfers  [place] for knees not to hurt and be able to walk without a cane, improve balance    OBJECTIVE:    PATIENT SURVEYS:  Eval: FOTO 44  COGNITION: Overall cognitive status: Within functional limits for tasks assessed     SENSATION: Not tested but reports some numbness in both thighs for past 5-6 years does have hx of back pain/back issues   PALPATION: Medial R knee TTP   LOWER EXTREMITY ROM:  Active ROM Right eval Left eval  Knee flexion A: 114 A: 108  Knee extension A: 3 with heel  prop  A: -2 with heel prop    (Blank rows = not tested)  LOWER EXTREMITY MMT:  MMT Right eval Left eval Right 11/07/21 Left 1018/23  Hip flexion 4- 4- 4- 4-  Hip extension      Hip abduction 3 3    Hip adduction      Hip internal rotation      Hip external rotation      Knee flexion 3+ 3 4 4   Knee extension 4+ 4+ 4+ 4+  Ankle dorsiflexion 5 5    Ankle plantarflexion      Ankle inversion      Ankle eversion       (Blank rows = not tested)  FUNCTIONAL TESTS:  Eval: Berg Balance Scale: 33/56  GAIT: Distance walked: in clinic distances  Assistive device utilized: Single point cane Level of assistance: Modified independence Comments: wide BOS, B LEs externally rotated, antalgic gait pattern, trunk flexed  TODAY'S TREATMENT: 11/07/21 TherEx NuStep L6 x 6 min UE/LE Bil LAQ X10 Bil marching X10 bil Sit to/from stand x 5 reps; elevated mat still needing UE support Seated hip add ball squeez3 5 sec X10 Seated hip abd clams green X10 Standing hamstring curl X 10 bilat Standing hip abduction x 10 reps bil Standing hip extension x 10 reps bil   11/05/21 TherEx NuStep L6 x 6 min UE/LE Bil LAQ 2x10; 3# Bil marching 3# 2x10 bil Sit to/from stand x 10 reps; elevated mat still needing UE support Standing hamstring curl X 10 bilat Standing hip abduction x 10 reps bil Standing hip extension x 10 reps bil  Gait with RW stepping up one 6.5 inch step step with review on technique.    PATIENT EDUCATION:  Education details: HEP, exam findings, POC  Person educated: Patient Education method: Explanation, Demonstration, and Handouts Education comprehension: verbalized understanding and returned demonstration   HOME EXERCISE PROGRAM: Access Code: [email or web address] URL: [email or web address] Date: 11/02/2021 Prepared by: Faustino Congress  Exercises - Sitting Knee Extension with Resistance  - 2 x daily - 7 x weekly - 1 sets - 10 reps - 2 hold - Seated  Hamstring Curls with Resistance  - 2 x daily - 7 x weekly - 1 sets - 10 reps - 2 hold - Seated Hip Abduction with Resistance  - 2 x daily - 7 x weekly - 1 sets - 10 reps - 2 hold - Long Sitting Quad Set  - 3 x daily - 7 x weekly - 1 sets - 10 reps - 5 sec hold - Supine Heel Slide  - 1 x daily - 7 x weekly - 1 sets - 10 reps - Seated Chair Push Ups  - 2-3 x daily - 7 x weekly - 1 sets - 5-10 reps  ASSESSMENT:  CLINICAL IMPRESSION: 1/3 short term goals met at this time. She says she can some improvements since starting PT. We reviewed ways to decreased her risk of falling today. I backed down on her overall strength program this session as she was painful and sore after last session.   OBJECTIVE IMPAIRMENTS Abnormal gait, decreased balance, decreased knowledge of use of DME, decreased mobility, difficulty walking, decreased ROM, decreased strength, decreased safety awareness, hypomobility, obesity, and pain.   ACTIVITY LIMITATIONS standing, squatting, stairs, transfers, bed mobility, locomotion level, and caring for others  PARTICIPATION LIMITATIONS: cleaning, laundry, shopping, community activity, and yard work  PERSONAL FACTORS Age, Behavior pattern, Fitness, Past/current experiences, Social background, and Time since onset of injury/illness/exacerbation are also affecting patient's functional outcome.   REHAB POTENTIAL: Good  CLINICAL DECISION MAKING: Stable/uncomplicated  EVALUATION COMPLEXITY: Low   GOALS: Goals reviewed with patient? Yes  SHORT TERM GOALS: Target date: 11/09/2021  Will be compliant with appropriate progressive HEP  Baseline: Goal status: MET 11/07/21  2.  Pain in B knees to be no more than 6/10  Baseline:  Goal status: ongoing, pain inceased to 8 on 11/07/21  3.  Will be able to name 3 way to reduce fall risk at home and in the community  Baseline:  Goal status: ongoing, unable to name any ways but this was reviewed with patient 11/07/21   LONG TERM  GOALS: Target date: 12/07/2021   MMT to improve by at least 1 grade in all weak groups  Baseline:  Goal status: ongoing  2.  Will score at least 43 on the Berg to show improved balance/reduced fall risk  Baseline:  Goal status: ongoing  3.  Pain in B knees to be no more than 4/10 at worst  Baseline:  Goal status: ongoing  4.  Will be able to tolerate trips to the grocery store without increased pain in order to improve community access  Baseline:  Goal status: ongoing  5.  Will be compliant with gym based HEP to help maintain readiness for potential surgery  Baseline:  Goal status: ongoing   PLAN: PT FREQUENCY: 2x/week  PT DURATION: 8 weeks  PLANNED INTERVENTIONS: Therapeutic exercises, Therapeutic activity, Neuromuscular re-education, Balance training, Gait training, Patient/Family education, Self Care, Joint mobilization, DME instructions, Dry Needling, Electrical stimulation, Cryotherapy, Moist heat, Taping, Ultrasound, Ionotophoresis 7m/ml Dexamethasone, Manual therapy, and Re-evaluation  PLAN FOR NEXT SESSION:  focus on general strengthening to tolerance and monitor her pain and soreness, balance/post-op needs   BElsie Ra PT, DPT 11/07/21 12:15 PM

## 2021-11-14 ENCOUNTER — Encounter: Payer: Medicare Other | Admitting: Physical Therapy

## 2021-11-14 NOTE — Therapy (Incomplete)
OUTPATIENT PHYSICAL THERAPY TREATMENT NOTE   Patient Name: Chelsey Lee MRN: [id number] DOB:10-Oct-1955, 66 y.o., female Today's Date: 11/14/2021  END OF SESSION:       Past Medical History:  Diagnosis Date   Arthritis    Chronic kidney disease    stage 4   Diabetes mellitus    GERD (gastroesophageal reflux disease)    Hyperlipemia    Hypertension    Seasonal allergies    Sleep apnea    Past Surgical History:  Procedure Laterality Date   AV FISTULA PLACEMENT Left 04/10/2020   Procedure: LEFT ARM ARTERIOVENOUS (AV) FISTULA CREATION;  Surgeon: Elam Dutch, MD;  Location: [place];  Service: Vascular;  Laterality: Left;   CAPSULOTOMY  01/13/2012   Procedure: MINOR CAPSULOTOMY;  Surgeon: Myrtha Mantis., MD;  Location: [place];  Service: Ophthalmology;  Laterality: Left;   DORSAL COMPARTMENT RELEASE  2009   rt    FISTULA SUPERFICIALIZATION Left 05/29/2020   Procedure: LEFT UPPER ARM FISTULA SUPERFICIALIZATION WITH LIGATION OF MULTIPLE SIDE BRANCHES;  Surgeon: Elam Dutch, MD;  Location: [place];  Service: Vascular;  Laterality: Left;   KNEE ARTHROSCOPY  02,04   right and left   STERIOD INJECTION  08/01/2011   Procedure: STEROID INJECTION;  Surgeon: Cammie Sickle., MD;  Location: [place];  Service: Orthopedics;  Laterality: Left;  cmc left thumb   TRIGGER FINGER RELEASE  2006   rt thumb   TRIGGER FINGER RELEASE  08/01/2011   Procedure: RELEASE TRIGGER FINGER/A-1 PULLEY;  Surgeon: Cammie Sickle., MD;  Location: [place];  Service: Orthopedics;  Laterality: Left;  left thumb   YAG LASER APPLICATION  19/62/2297   Procedure: YAG LASER APPLICATION;  Surgeon: Myrtha Mantis., MD;  Location: [place];  Service: Ophthalmology;  Laterality: Left;   Patient Active Problem List   Diagnosis Date Noted   Pain in left hand 06/29/2021   Cerebrovascular disease 05/11/2020   Gout 03/09/2020   Allergic rhinitis 01/25/2020    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   Colon cancer screening 01/25/2020   Gastroesophageal reflux disease without esophagitis 01/25/2020   Hyperlipidemia 01/25/2020   Morbid obesity ([place]) 01/25/2020   Personal history of colonic polyps 01/25/2020   OSA (obstructive sleep apnea) 01/25/2020   Vitamin D deficiency 01/25/2020   Controlled type 2 diabetes mellitus without complication ([place]) 98/92/1194   Chronic kidney disease, stage 4 (severe) ([place]) 06/29/2019   Long term (current) use of insulin ([place]) 06/29/2019   Epidural lipomatosis 03/02/2019   Spondylolisthesis of lumbar region 03/02/2019   Spinal stenosis of lumbar region with neurogenic claudication 03/02/2019   Left wrist tendinitis 09/07/2018   Localized osteoarthritis of left knee 04/03/2018   Celiac disease 08/21/2016   Abnormal thyroid function test 04/01/2016   Pain in thumb joint with movement of right hand 09/14/2015   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   Moderate major depression, single episode ([place]) 02/02/2010   Diabetic renal disease ([place]) 06/07/2009   Anemia 01/18/2009   Essential hypertension 01/18/2009     THERAPY DIAG:  No diagnosis found.   PCP: Willey Blade   REFERRING PROVIDER: Newt Minion, MD   REFERRING DIAG: [id number] (ICD-10-CM) - Primary osteoarthritis of left knee   Rationale for Evaluation and Treatment Rehabilitation  ONSET DATE: 10/01/2021   SUBJECTIVE:   SUBJECTIVE STATEMENT: *** She was really sore after last session with more overall pain  PERTINENT HISTORY: HPI: Patient is a 66 year old woman  who presents in follow-up for osteoarthritis left knee.  Patient currently ambulates with a cane and has difficulty with activities of daily living due to knee arthritis.  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    PAIN:  Are you having pain? Yes: NPRS scale: 8/10 currently Pain location: both knees  Pain  description: dull pain, audible grinding  Aggravating factors: bending knees to sit down, steps, going from sit to stand  Relieving factors: sitting down/getting weight off of LEs   PRECAUTIONS: None  WEIGHT BEARING RESTRICTIONS No  FALLS:  Has patient fallen in last 6 months? Yes. 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  Lives in: House/apartment Stairs: 1 STE no rails, 1 step inside the home  Has following equipment at home: Single point cane and toilet riser with rails   OCCUPATION: retired   PLOF: Independent, Independent with basic ADLs, Independent with gait, and Independent with transfers  [place] for knees not to hurt and be able to walk without a cane, improve balance    OBJECTIVE:    PATIENT SURVEYS:  Eval: FOTO 44  COGNITION: Overall cognitive status: Within functional limits for tasks assessed     SENSATION: Not tested but reports some numbness in both thighs for past 5-6 years does have hx of back pain/back issues   PALPATION: Medial R knee TTP   LOWER EXTREMITY ROM:  Active ROM Right eval Left eval  Knee flexion A: 114 A: 108  Knee extension A: 3 with heel prop  A: -2 with heel prop    (Blank rows = not tested)  LOWER EXTREMITY MMT:  MMT Right eval Left eval Right 11/07/21 Left 1018/23  Hip flexion 4- 4- 4- 4-  Hip extension      Hip abduction 3 3    Hip adduction      Hip internal rotation      Hip external rotation      Knee flexion 3+ _0 Knee extension 4+ 4+ 4+ 4+  Ankle dorsiflexion 5 5    Ankle plantarflexion      Ankle inversion      Ankle eversion       (Blank rows = not tested)  FUNCTIONAL TESTS:  Eval: Berg Balance Scale: 33/56  GAIT: Distance walked: in clinic distances  Assistive device utilized:  Single point cane Level of assistance: Modified independence Comments: wide BOS, B LEs externally rotated, antalgic gait pattern, trunk flexed     TODAY'S TREATMENT: 11/14/21 ***  11/07/21 TherEx NuStep L6 x 6 min UE/LE Bil LAQ X10 Bil marching X10 bil Sit to/from stand x 5 reps; elevated mat still needing UE support Seated hip add ball squeez3 5 sec X10 Seated hip abd clams green X10 Standing hamstring curl X 10 bilat Standing hip abduction x 10 reps bil Standing hip extension x 10 reps bil   11/05/21 TherEx NuStep L6 x 6 min UE/LE Bil LAQ 2x10; 3# Bil marching 3# 2x10 bil Sit to/from stand x 10 reps; elevated mat still needing UE support Standing hamstring curl X 10 bilat Standing hip abduction x 10 reps bil Standing hip extension x 10 reps bil  Gait with RW stepping up one 6.5 inch step step  with review on technique.    PATIENT EDUCATION:  Education details: HEP, exam findings, POC  Person educated: Patient Education method: Explanation, Demonstration, and Handouts Education comprehension: verbalized understanding and returned demonstration   HOME EXERCISE PROGRAM: Access Code: [email or web address] URL: [email or web address] Date: 11/02/2021 Prepared by: Faustino Congress  Exercises - Sitting Knee Extension with Resistance  - 2 x daily - 7 x weekly - 1 sets - 10 reps - 2 hold - Seated Hamstring Curls with Resistance  - 2 x daily - 7 x weekly - 1 sets - 10 reps - 2 hold - Seated Hip Abduction with Resistance  - 2 x daily - 7 x weekly - 1 sets - 10 reps - 2 hold - Long Sitting Quad Set  - 3 x daily - 7 x weekly - 1 sets - 10 reps - 5 sec hold - Supine Heel Slide  - 1 x daily - 7 x weekly - 1 sets - 10 reps - Seated Chair Push Ups  - 2-3 x daily - 7 x weekly - 1 sets - 5-10 reps  ASSESSMENT:  CLINICAL IMPRESSION: *** 1/3 short term goals met at this time. She says she can some improvements since starting PT. We reviewed ways to decreased her risk of  falling today. I backed down on her overall strength program this session as she was painful and sore after last session.   OBJECTIVE IMPAIRMENTS Abnormal gait, decreased balance, decreased knowledge of use of DME, decreased mobility, difficulty walking, decreased ROM, decreased strength, decreased safety awareness, hypomobility, obesity, and pain.   ACTIVITY LIMITATIONS standing, squatting, stairs, transfers, bed mobility, locomotion level, and caring for others  PARTICIPATION LIMITATIONS: cleaning, laundry, shopping, community activity, and yard work  PERSONAL FACTORS Age, Behavior pattern, Fitness, Past/current experiences, Social background, and Time since onset of injury/illness/exacerbation are also affecting patient's functional outcome.   REHAB POTENTIAL: Good  CLINICAL DECISION MAKING: Stable/uncomplicated  EVALUATION COMPLEXITY: Low   GOALS: Goals reviewed with patient? Yes  SHORT TERM GOALS: Target date: 11/09/2021  Will be compliant with appropriate progressive HEP  Baseline: Goal status: MET 11/07/21  2.  Pain in B knees to be no more than 6/10  Baseline:  Goal status: ongoing, pain inceased to 8 on 11/07/21  3.  Will be able to name 3 way to reduce fall risk at home and in the community  Baseline:  Goal status: ongoing, unable to name any ways but this was reviewed with patient 11/07/21   LONG TERM GOALS: Target date: 12/07/2021   MMT to improve by at least 1 grade in all weak groups  Baseline:  Goal status: ongoing  2.  Will score at least 43 on the Berg to show improved balance/reduced fall risk  Baseline:  Goal status: ongoing  3.  Pain in B knees to be no more than 4/10 at worst  Baseline:  Goal status: ongoing  4.  Will be able to tolerate trips to the grocery store without increased pain in order to improve community access  Baseline:  Goal status: ongoing  5.  Will be compliant with gym based HEP to help maintain readiness for potential surgery   Baseline:  Goal status: ongoing   PLAN: PT FREQUENCY: 2x/week  PT DURATION: 8 weeks  PLANNED INTERVENTIONS: Therapeutic exercises, Therapeutic activity, Neuromuscular re-education, Balance training, Gait training, Patient/Family education, Self Care, Joint mobilization, DME instructions, Dry Needling, Electrical stimulation, Cryotherapy, Moist heat, Taping, Ultrasound, Ionotophoresis 69m/ml Dexamethasone, Manual therapy, and Re-evaluation  PLAN FOR NEXT  SESSION:  *** focus on general strengthening to tolerance and monitor her pain and soreness, balance/post-op needs   Laureen Abrahams, PT, DPT 11/14/21 7:27 AM

## 2021-11-16 ENCOUNTER — Ambulatory Visit (INDEPENDENT_AMBULATORY_CARE_PROVIDER_SITE_OTHER): Payer: Medicare Other | Admitting: Physical Therapy

## 2021-11-16 ENCOUNTER — Encounter: Payer: Self-pay | Admitting: Physical Therapy

## 2021-11-16 DIAGNOSIS — M25561 Pain in right knee: Secondary | ICD-10-CM

## 2021-11-16 DIAGNOSIS — G8929 Other chronic pain: Secondary | ICD-10-CM

## 2021-11-16 DIAGNOSIS — M6281 Muscle weakness (generalized): Secondary | ICD-10-CM | POA: Diagnosis not present

## 2021-11-16 DIAGNOSIS — R262 Difficulty in walking, not elsewhere classified: Secondary | ICD-10-CM | POA: Diagnosis not present

## 2021-11-16 DIAGNOSIS — M25562 Pain in left knee: Secondary | ICD-10-CM | POA: Diagnosis not present

## 2021-11-16 DIAGNOSIS — R2681 Unsteadiness on feet: Secondary | ICD-10-CM

## 2021-11-16 NOTE — Therapy (Signed)
OUTPATIENT PHYSICAL THERAPY TREATMENT NOTE   Patient Name: Chelsey Lee MRN: [id number] DOB:08-22-1955, 66 y.o., female Today's Date: 11/16/2021  END OF SESSION:   PT End of Session - 11/16/21 1105     Visit Number 8    Number of Visits 17    Date for PT Re-Evaluation 12/07/21    Authorization Type Medicare and BCBS    Authorization Time Period 10/12/21 to 12/07/21    Progress Note Due on Visit 10    PT Start Time 1101    PT Stop Time 1139    PT Time Calculation (min) 38 min    Activity Tolerance Patient tolerated treatment well    Behavior During Therapy [facility] for tasks assessed/performed                Past Medical History:  Diagnosis Date   Arthritis    Chronic kidney disease    stage 4   Diabetes mellitus    GERD (gastroesophageal reflux disease)    Hyperlipemia    Hypertension    Seasonal allergies    Sleep apnea    Past Surgical History:  Procedure Laterality Date   AV FISTULA PLACEMENT Left 04/10/2020   Procedure: LEFT ARM ARTERIOVENOUS (AV) FISTULA CREATION;  Surgeon: Elam Dutch, MD;  Location: [place];  Service: Vascular;  Laterality: Left;   CAPSULOTOMY  01/13/2012   Procedure: MINOR CAPSULOTOMY;  Surgeon: Myrtha Mantis., MD;  Location: [place];  Service: Ophthalmology;  Laterality: Left;   DORSAL COMPARTMENT RELEASE  2009   rt    FISTULA SUPERFICIALIZATION Left 05/29/2020   Procedure: LEFT UPPER ARM FISTULA SUPERFICIALIZATION WITH LIGATION OF MULTIPLE SIDE BRANCHES;  Surgeon: Elam Dutch, MD;  Location: [place];  Service: Vascular;  Laterality: Left;   KNEE ARTHROSCOPY  02,04   right and left   STERIOD INJECTION  08/01/2011   Procedure: STEROID INJECTION;  Surgeon: Cammie Sickle., MD;  Location: [place];  Service: Orthopedics;  Laterality: Left;  cmc left thumb   TRIGGER FINGER RELEASE  2006   rt thumb   TRIGGER FINGER RELEASE  08/01/2011   Procedure: RELEASE TRIGGER FINGER/A-1 PULLEY;  Surgeon: Cammie Sickle., MD;  Location: [place];  Service: Orthopedics;  Laterality: Left;  left thumb   YAG LASER APPLICATION  13/24/4010   Procedure: YAG LASER APPLICATION;  Surgeon: Myrtha Mantis., MD;  Location: [place];  Service: Ophthalmology;  Laterality: Left;   Patient Active Problem List   Diagnosis Date Noted   Pain in left hand 06/29/2021   Cerebrovascular disease 05/11/2020   Gout 03/09/2020   Allergic rhinitis 01/25/2020   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   Colon cancer screening 01/25/2020   Gastroesophageal reflux disease without esophagitis 01/25/2020   Hyperlipidemia 01/25/2020   Morbid obesity ([place]) 01/25/2020   Personal history of colonic polyps 01/25/2020   OSA (obstructive sleep apnea) 01/25/2020   Vitamin D deficiency 01/25/2020   Controlled type 2 diabetes mellitus without complication ([place]) 27/25/3664   Chronic kidney disease, stage 4 (severe) ([place]) 06/29/2019   Long term (current) use of insulin ([place]) 06/29/2019   Epidural lipomatosis 03/02/2019   Spondylolisthesis of lumbar region 03/02/2019   Spinal stenosis of lumbar region with neurogenic claudication 03/02/2019   Left wrist tendinitis 09/07/2018   Localized osteoarthritis of left knee 04/03/2018   Celiac disease 08/21/2016   Abnormal thyroid function test 04/01/2016   Pain in thumb joint with movement of  right hand 09/14/2015   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   Moderate major depression, single episode ([place]) 02/02/2010   Diabetic renal disease ([place]) 06/07/2009   Anemia 01/18/2009   Essential hypertension 01/18/2009     THERAPY DIAG:  Chronic pain of right knee  Chronic pain of left knee  Muscle weakness (generalized)  Difficulty in walking, not elsewhere classified  Unsteadiness on feet   PCP: Willey Blade   REFERRING PROVIDER: Newt Minion, MD   REFERRING DIAG: [phone] (ICD-10-CM) - Primary osteoarthritis of left knee   Rationale for Evaluation and  Treatment Rehabilitation  ONSET DATE: 10/01/2021   SUBJECTIVE:   SUBJECTIVE STATEMENT: Wants to have her Lt knee done first now because the pain has increased.    PERTINENT HISTORY: HPI: Patient is a 66 year old woman who presents in follow-up for osteoarthritis left knee.  Patient currently ambulates with a cane and has difficulty with activities of daily living due to knee arthritis.  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    PAIN:  Are you having pain? Yes: NPRS scale: 9/10 currently Pain location: Lt knee  Pain description: dull pain, audible grinding  Aggravating factors: bending knees to sit down, steps, going from sit to stand  Relieving factors: sitting down/getting weight off of LEs   PRECAUTIONS: None  WEIGHT BEARING RESTRICTIONS No  FALLS:  Has patient fallen in last 6 months? Yes. 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  Lives in: House/apartment Stairs: 1 STE no rails, 1 step inside the home  Has following equipment at home: Single point cane and toilet riser with rails   OCCUPATION: retired   PLOF: Independent, Independent with basic ADLs, Independent with gait, and Independent with transfers  [place] for knees not to hurt and be able to walk without a cane, improve balance    OBJECTIVE:    PATIENT SURVEYS:  Eval: FOTO 44  COGNITION: Overall cognitive status: Within functional limits for tasks assessed     SENSATION: Not tested but reports some numbness in both thighs for past 5-6 years does have hx of back pain/back issues   PALPATION: Medial R knee TTP   LOWER EXTREMITY ROM:  Active ROM Right eval Left eval  Knee flexion A: 114 A: 108  Knee  extension A: 3 with heel prop  A: -2 with heel prop    (Blank rows = not tested)  LOWER EXTREMITY MMT:  MMT Right eval Left eval Right 11/07/21 Left 1018/23  Hip flexion 4- 4- 4- 4-  Hip extension      Hip abduction 3 3    Hip adduction      Hip internal rotation      Hip external rotation      Knee flexion 3+ _0 Knee extension 4+ 4+ 4+ 4+  Ankle dorsiflexion 5 5    Ankle plantarflexion      Ankle inversion      Ankle eversion       (Blank rows = not tested)  FUNCTIONAL TESTS:  Eval: Berg Balance Scale: 33/56  GAIT: Distance walked: in clinic distances  Assistive device utilized: Single point cane Level of assistance: Modified independence Comments: wide BOS, B LEs externally rotated, antalgic gait  pattern, trunk flexed     TODAY'S TREATMENT: 11/16/21 TherEx NuStep L6 x 6 min UE/LE Seated chair push up x 10 reps Bil LAQ 2x10; 3# Seated marching 3# 2x10 bil  Gait Training Negotiated curb x 3 backwards with RW; min to mod cues initially progressing to supervision only.    11/07/21 TherEx NuStep L6 x 6 min UE/LE Bil LAQ X10 Bil marching X10 bil Sit to/from stand x 5 reps; elevated mat still needing UE support Seated hip add ball squeez3 5 sec X10 Seated hip abd clams green X10 Standing hamstring curl X 10 bilat Standing hip abduction x 10 reps bil Standing hip extension x 10 reps bil   11/05/21 TherEx NuStep L6 x 6 min UE/LE Bil LAQ 2x10; 3# Bil marching 3# 2x10 bil Sit to/from stand x 10 reps; elevated mat still needing UE support Standing hamstring curl X 10 bilat Standing hip abduction x 10 reps bil Standing hip extension x 10 reps bil  Gait with RW stepping up one 6.5 inch step step with review on technique.    PATIENT EDUCATION:  Education details: HEP, exam findings, POC  Person educated: Patient Education method: Explanation, Demonstration, and Handouts Education comprehension: verbalized understanding and returned  demonstration   HOME EXERCISE PROGRAM: Access Code: [email or web address] URL: [email or web address] Date: 11/02/2021 Prepared by: Faustino Congress  Exercises - Sitting Knee Extension with Resistance  - 2 x daily - 7 x weekly - 1 sets - 10 reps - 2 hold - Seated Hamstring Curls with Resistance  - 2 x daily - 7 x weekly - 1 sets - 10 reps - 2 hold - Seated Hip Abduction with Resistance  - 2 x daily - 7 x weekly - 1 sets - 10 reps - 2 hold - Long Sitting Quad Set  - 3 x daily - 7 x weekly - 1 sets - 10 reps - 5 sec hold - Supine Heel Slide  - 1 x daily - 7 x weekly - 1 sets - 10 reps - Seated Chair Push Ups  - 2-3 x daily - 7 x weekly - 1 sets - 5-10 reps  ASSESSMENT:  CLINICAL IMPRESSION: Pt tolerated session well today with continued focus on strengthening and pre-op exercises.  Continue to practice single step as pt will need to do this to enter home.  Will continue to benefit from PT to maximize function.  OBJECTIVE IMPAIRMENTS Abnormal gait, decreased balance, decreased knowledge of use of DME, decreased mobility, difficulty walking, decreased ROM, decreased strength, decreased safety awareness, hypomobility, obesity, and pain.   ACTIVITY LIMITATIONS standing, squatting, stairs, transfers, bed mobility, locomotion level, and caring for others  PARTICIPATION LIMITATIONS: cleaning, laundry, shopping, community activity, and yard work  PERSONAL FACTORS Age, Behavior pattern, Fitness, Past/current experiences, Social background, and Time since onset of injury/illness/exacerbation are also affecting patient's functional outcome.   REHAB POTENTIAL: Good  CLINICAL DECISION MAKING: Stable/uncomplicated  EVALUATION COMPLEXITY: Low   GOALS: Goals reviewed with patient? Yes  SHORT TERM GOALS: Target date: 11/09/2021  Will be compliant with appropriate progressive HEP  Baseline: Goal status: MET 11/07/21  2.  Pain in B knees to be no more than 6/10  Baseline:  Goal status:  ongoing, pain inceased to 8 on 11/07/21  3.  Will be able to name 3 way to reduce fall risk at home and in the community  Baseline:  Goal status: ongoing, unable to name any ways but this was reviewed with patient 11/07/21   LONG TERM GOALS: Target  date: 12/07/2021   MMT to improve by at least 1 grade in all weak groups  Baseline:  Goal status: ongoing  2.  Will score at least 43 on the Berg to show improved balance/reduced fall risk  Baseline:  Goal status: ongoing  3.  Pain in B knees to be no more than 4/10 at worst  Baseline:  Goal status: ongoing  4.  Will be able to tolerate trips to the grocery store without increased pain in order to improve community access  Baseline:  Goal status: ongoing  5.  Will be compliant with gym based HEP to help maintain readiness for potential surgery  Baseline:  Goal status: ongoing   PLAN: PT FREQUENCY: 2x/week  PT DURATION: 8 weeks  PLANNED INTERVENTIONS: Therapeutic exercises, Therapeutic activity, Neuromuscular re-education, Balance training, Gait training, Patient/Family education, Self Care, Joint mobilization, DME instructions, Dry Needling, Electrical stimulation, Cryotherapy, Moist heat, Taping, Ultrasound, Ionotophoresis 23m/ml Dexamethasone, Manual therapy, and Re-evaluation  PLAN FOR NEXT SESSION:  continue  general strengthening to tolerance and monitor her pain and soreness, balance/post-op needs, practice single step  SLaureen Abrahams PT, DPT 11/16/21 11:50 AM

## 2021-11-19 ENCOUNTER — Ambulatory Visit (INDEPENDENT_AMBULATORY_CARE_PROVIDER_SITE_OTHER): Payer: Medicare Other | Admitting: Physical Therapy

## 2021-11-19 ENCOUNTER — Encounter: Payer: Self-pay | Admitting: Physical Therapy

## 2021-11-19 DIAGNOSIS — M25562 Pain in left knee: Secondary | ICD-10-CM

## 2021-11-19 DIAGNOSIS — M25561 Pain in right knee: Secondary | ICD-10-CM

## 2021-11-19 DIAGNOSIS — M6281 Muscle weakness (generalized): Secondary | ICD-10-CM

## 2021-11-19 DIAGNOSIS — R262 Difficulty in walking, not elsewhere classified: Secondary | ICD-10-CM | POA: Diagnosis not present

## 2021-11-19 DIAGNOSIS — R600 Localized edema: Secondary | ICD-10-CM

## 2021-11-19 DIAGNOSIS — G8929 Other chronic pain: Secondary | ICD-10-CM

## 2021-11-19 NOTE — Therapy (Addendum)
OUTPATIENT PHYSICAL THERAPY TREATMENT NOTE   Patient Name: Chelsey Lee MRN: [id number] DOB:Jun 01, 1955, 66 y.o., female Today's Date: 11/19/2021  END OF SESSION:   PT End of Session - 11/19/21 1145     Visit Number 9    Number of Visits 17    Date for PT Re-Evaluation 12/07/21    Authorization Type Medicare and BCBS    Authorization Time Period 10/12/21 to 12/07/21    Progress Note Due on Visit 10    PT Start Time 1139    PT Stop Time 1227    PT Time Calculation (min) 48 min    Activity Tolerance Patient tolerated treatment well    Behavior During Therapy [facility] for tasks assessed/performed                Past Medical History:  Diagnosis Date   Arthritis    Chronic kidney disease    stage 4   Diabetes mellitus    GERD (gastroesophageal reflux disease)    Hyperlipemia    Hypertension    Seasonal allergies    Sleep apnea    Past Surgical History:  Procedure Laterality Date   AV FISTULA PLACEMENT Left 04/10/2020   Procedure: LEFT ARM ARTERIOVENOUS (AV) FISTULA CREATION;  Surgeon: Elam Dutch, MD;  Location: [place];  Service: Vascular;  Laterality: Left;   CAPSULOTOMY  01/13/2012   Procedure: MINOR CAPSULOTOMY;  Surgeon: Myrtha Mantis., MD;  Location: [place];  Service: Ophthalmology;  Laterality: Left;   DORSAL COMPARTMENT RELEASE  2009   rt    FISTULA SUPERFICIALIZATION Left 05/29/2020   Procedure: LEFT UPPER ARM FISTULA SUPERFICIALIZATION WITH LIGATION OF MULTIPLE SIDE BRANCHES;  Surgeon: Elam Dutch, MD;  Location: [place];  Service: Vascular;  Laterality: Left;   KNEE ARTHROSCOPY  02,04   right and left   STERIOD INJECTION  08/01/2011   Procedure: STEROID INJECTION;  Surgeon: Cammie Sickle., MD;  Location: [place];  Service: Orthopedics;  Laterality: Left;  cmc left thumb   TRIGGER FINGER RELEASE  2006   rt thumb   TRIGGER FINGER RELEASE  08/01/2011   Procedure: RELEASE TRIGGER FINGER/A-1 PULLEY;  Surgeon: Cammie Sickle., MD;  Location: [place];  Service: Orthopedics;  Laterality: Left;  left thumb   YAG LASER APPLICATION  35/59/7416   Procedure: YAG LASER APPLICATION;  Surgeon: Myrtha Mantis., MD;  Location: [place];  Service: Ophthalmology;  Laterality: Left;   Patient Active Problem List   Diagnosis Date Noted   Pain in left hand 06/29/2021   Cerebrovascular disease 05/11/2020   Gout 03/09/2020   Allergic rhinitis 01/25/2020   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   Colon cancer screening 01/25/2020   Gastroesophageal reflux disease without esophagitis 01/25/2020   Hyperlipidemia 01/25/2020   Morbid obesity ([place]) 01/25/2020   Personal history of colonic polyps 01/25/2020   OSA (obstructive sleep apnea) 01/25/2020   Vitamin D deficiency 01/25/2020   Controlled type 2 diabetes mellitus without complication ([place]) 38/45/3646   Chronic kidney disease, stage 4 (severe) ([place]) 06/29/2019   Long term (current) use of insulin ([place]) 06/29/2019   Epidural lipomatosis 03/02/2019   Spondylolisthesis of lumbar region 03/02/2019   Spinal stenosis of lumbar region with neurogenic claudication 03/02/2019   Left wrist tendinitis 09/07/2018   Localized osteoarthritis of left knee 04/03/2018   Celiac disease 08/21/2016   Abnormal thyroid function test 04/01/2016   Pain in thumb joint with movement of  right hand 09/14/2015   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   Moderate major depression, single episode ([place]) 02/02/2010   Diabetic renal disease ([place]) 06/07/2009   Anemia 01/18/2009   Essential hypertension 01/18/2009     THERAPY DIAG:  Chronic pain of right knee - Plan: PT plan of care cert/re-cert  Chronic pain of left knee - Plan: PT plan of care cert/re-cert  Muscle weakness (generalized) - Plan: PT plan of care cert/re-cert  Difficulty in walking, not elsewhere classified - Plan: PT plan of care cert/re-cert  Localized edema - Plan: PT plan of care  cert/re-cert   PCP: Willey Blade   REFERRING PROVIDER: Newt Minion, MD   REFERRING DIAG: [phone] (ICD-10-CM) - Primary osteoarthritis of left knee   Rationale for Evaluation and Treatment Rehabilitation  ONSET DATE: 10/01/2021   SUBJECTIVE:   SUBJECTIVE STATEMENT: She is having more pain in the left knee  PERTINENT HISTORY: HPI: Patient is a 66 year old woman who presents in follow-up for osteoarthritis left knee.  Patient currently ambulates with a cane and has difficulty with activities of daily living due to knee arthritis.  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    PAIN:  Are you having pain? Yes: NPRS scale: 7/10 currently Pain location: Lt knee  Pain description: dull pain, audible grinding  Aggravating factors: bending knees to sit down, steps, going from sit to stand  Relieving factors: sitting down/getting weight off of LEs   PRECAUTIONS: None  WEIGHT BEARING RESTRICTIONS No  FALLS:  Has patient fallen in last 6 months? Yes. 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  Lives in: House/apartment Stairs: 1 STE no rails, 1 step inside the home  Has following equipment at home: Single point cane and toilet riser with rails   OCCUPATION: retired   PLOF: Independent, Independent with basic ADLs, Independent with gait, and Independent with transfers  [place] for knees not to hurt and be able to walk without a cane, improve balance    OBJECTIVE:    PATIENT SURVEYS:  Eval: FOTO 44  COGNITION: Overall cognitive status: Within functional limits for tasks assessed     SENSATION: Not tested but reports some numbness in both thighs for past 5-6 years does have hx of  back pain/back issues   PALPATION: Medial R knee TTP   LOWER EXTREMITY ROM:  Active ROM Right eval Left eval  Knee flexion A: 114 A: 108  Knee extension A: 3 with heel prop  A: -2 with heel prop    (Blank rows = not tested)  LOWER EXTREMITY MMT:  MMT Right eval Left eval Right 11/07/21 Left 1018/23  Hip flexion 4- 4- 4- 4-  Hip extension      Hip abduction 3 3    Hip adduction      Hip internal rotation      Hip external rotation      Knee flexion 3+ _0 Knee extension 4+ 4+ 4+ 4+  Ankle dorsiflexion 5 5    Ankle plantarflexion      Ankle inversion      Ankle eversion       (Blank rows = not tested)  FUNCTIONAL TESTS:  Eval: Berg Balance Scale: 33/56  GAIT: Distance walked: in clinic distances  Assistive device utilized: Single point cane Level of assistance: Modified independence Comments: wide BOS, B LEs externally rotated, antalgic gait pattern, trunk flexed     TODAY'S TREATMENT:  11/19/21 NuStep L6 x 6 min UE/LE Bil LAQ 3# 3X5 Bil marching X10 bil Seated chair push up x 10 reps Seated hip add ball squeez3 5 sec X10 Seated hip abd clams green X10 Standing hamstring curl X 10 bilat Standing hip abduction x 10 reps bil Standing hip extension x 10 reps bil  -Vasopnuematic device X 10 min, medium compression, 34 deg to bilat knees   11/16/21 TherEx NuStep L6 x 6 min UE/LE Seated chair push up x 10 reps Bil LAQ 2x10; 3# Seated marching 3# 2x10 bil  Gait Training Negotiated curb x 3 backwards with RW; min to mod cues initially progressing to supervision only.       PATIENT EDUCATION:  Education details: HEP, exam findings, POC  Person educated: Patient Education method: Explanation, Demonstration, and Handouts Education comprehension: verbalized understanding and returned demonstration   HOME EXERCISE PROGRAM: Access Code: [email or web address] URL: [email or web address] Date: 11/02/2021 Prepared by: Faustino Congress  Exercises - Sitting Knee Extension with Resistance  - 2 x daily - 7 x weekly - 1 sets - 10 reps - 2 hold - Seated Hamstring Curls with Resistance  - 2 x daily - 7 x weekly - 1 sets - 10 reps - 2 hold - Seated Hip Abduction with Resistance  - 2 x daily - 7 x weekly - 1 sets - 10 reps - 2 hold - Long Sitting Quad Set  - 3 x daily - 7 x weekly - 1 sets - 10 reps - 5 sec hold - Supine Heel Slide  - 1 x daily - 7 x weekly - 1 sets - 10 reps - Seated Chair Push Ups  - 2-3 x daily - 7 x weekly - 1 sets - 5-10 reps  ASSESSMENT:  CLINICAL IMPRESSION: Pt has been more limited with left knee pain lately. Now she plans to have Lt knee TKA done first and looking into January for this. She will continue to benefit from PT to maximize function and gain knee strength pre op. I did try vasopnuematic today to her left knee in efforts to reduce pain and swelling.   OBJECTIVE IMPAIRMENTS Abnormal gait, decreased balance, decreased knowledge of use of DME, decreased mobility, difficulty walking, decreased ROM, decreased strength, decreased safety awareness, hypomobility, obesity, and pain.   ACTIVITY LIMITATIONS standing, squatting, stairs, transfers, bed mobility, locomotion level, and caring for others  PARTICIPATION LIMITATIONS: cleaning, laundry, shopping, community activity, and yard work  PERSONAL FACTORS Age, Behavior pattern, Fitness, Past/current experiences, Social background, and Time since onset of injury/illness/exacerbation are also affecting patient's functional outcome.   REHAB POTENTIAL: Good  CLINICAL DECISION MAKING: Stable/uncomplicated  EVALUATION COMPLEXITY: Low   GOALS: Goals reviewed with patient? Yes  SHORT TERM GOALS: Target date: 11/09/2021  Will be compliant with appropriate progressive HEP  Baseline: Goal status: MET 11/07/21  2.  Pain in B knees to be no more than 6/10  Baseline:  Goal status: ongoing, pain inceased to 8 on 11/07/21  3.  Will be able to  name 3 way to reduce fall risk at home and in the community  Baseline:  Goal status: ongoing, unable to name any ways but this was reviewed with patient 11/07/21   LONG TERM GOALS: Target date: 12/07/2021   MMT to improve by  at least 1 grade in all weak groups  Baseline:  Goal status: ongoing  2.  Will score at least 43 on the Berg to show improved balance/reduced fall risk  Baseline:  Goal status: ongoing  3.  Pain in B knees to be no more than 4/10 at worst  Baseline:  Goal status: ongoing  4.  Will be able to tolerate trips to the grocery store without increased pain in order to improve community access  Baseline:  Goal status: ongoing  5.  Will be compliant with gym based HEP to help maintain readiness for potential surgery  Baseline:  Goal status: ongoing   PLAN: PT FREQUENCY: 2x/week  PT DURATION: 8 weeks  PLANNED INTERVENTIONS: Therapeutic exercises, Therapeutic activity, Neuromuscular re-education, Balance training, Gait training, Patient/Family education, Self Care, Joint mobilization, DME instructions, Dry Needling, Electrical stimulation, Cryotherapy, Moist heat, Taping, Ultrasound, Ionotophoresis 23m/ml Dexamethasone, Manual therapy, and Re-evaluation  PLAN FOR NEXT SESSION: She will need 10th visit progress note, did the vaso help any last time?   BElsie Ra PT, DPT 11/19/21 12:22 PM

## 2021-11-23 ENCOUNTER — Encounter: Payer: Self-pay | Admitting: Physical Therapy

## 2021-11-23 ENCOUNTER — Other Ambulatory Visit (HOSPITAL_COMMUNITY): Payer: Self-pay

## 2021-11-23 ENCOUNTER — Ambulatory Visit (INDEPENDENT_AMBULATORY_CARE_PROVIDER_SITE_OTHER): Payer: Medicare Other | Admitting: Physical Therapy

## 2021-11-23 DIAGNOSIS — R2681 Unsteadiness on feet: Secondary | ICD-10-CM

## 2021-11-23 DIAGNOSIS — G8929 Other chronic pain: Secondary | ICD-10-CM

## 2021-11-23 DIAGNOSIS — R262 Difficulty in walking, not elsewhere classified: Secondary | ICD-10-CM

## 2021-11-23 DIAGNOSIS — M25561 Pain in right knee: Secondary | ICD-10-CM | POA: Diagnosis not present

## 2021-11-23 DIAGNOSIS — M6281 Muscle weakness (generalized): Secondary | ICD-10-CM | POA: Diagnosis not present

## 2021-11-23 DIAGNOSIS — R600 Localized edema: Secondary | ICD-10-CM

## 2021-11-23 DIAGNOSIS — M25562 Pain in left knee: Secondary | ICD-10-CM

## 2021-11-23 MED ORDER — OZEMPIC (0.25 OR 0.5 MG/DOSE) 2 MG/3ML ~~LOC~~ SOPN
0.2500 mg | PEN_INJECTOR | SUBCUTANEOUS | 0 refills | Status: DC
Start: 2021-11-22 — End: 2021-12-07
  Filled 2021-11-23 – 2021-12-03 (×2): qty 3, 56d supply, fill #0

## 2021-11-23 NOTE — Therapy (Signed)
OUTPATIENT PHYSICAL THERAPY TREATMENT NOTE   Patient Name: Chelsey Lee MRN: [id number] DOB:May 23, 1955, 66 y.o., female Today's Date: 11/23/2021   Progress Note Reporting Period 10/12/21 to 11/23/21  See note below for Objective Data and Assessment of Progress/Goals.      END OF SESSION:   PT End of Session - 11/23/21 1140     Visit Number 10    Number of Visits 17    Date for PT Re-Evaluation 12/07/21    Authorization Type Medicare and BCBS    Authorization Time Period 10/12/21 to 12/07/21    Progress Note Due on Visit 20    PT Start Time 1103    PT Stop Time 1144    PT Time Calculation (min) 41 min    Activity Tolerance Patient tolerated treatment well    Behavior During Therapy WFL for tasks assessed/performed                 Past Medical History:  Diagnosis Date   Arthritis    Chronic kidney disease    stage 4   Diabetes mellitus    GERD (gastroesophageal reflux disease)    Hyperlipemia    Hypertension    Seasonal allergies    Sleep apnea    Past Surgical History:  Procedure Laterality Date   AV FISTULA PLACEMENT Left 04/10/2020   Procedure: LEFT ARM ARTERIOVENOUS (AV) FISTULA CREATION;  Surgeon: Elam Dutch, MD;  Location: [place];  Service: Vascular;  Laterality: Left;   CAPSULOTOMY  01/13/2012   Procedure: MINOR CAPSULOTOMY;  Surgeon: Myrtha Mantis., MD;  Location: [place];  Service: Ophthalmology;  Laterality: Left;   DORSAL COMPARTMENT RELEASE  2009   rt    FISTULA SUPERFICIALIZATION Left 05/29/2020   Procedure: LEFT UPPER ARM FISTULA SUPERFICIALIZATION WITH LIGATION OF MULTIPLE SIDE BRANCHES;  Surgeon: Elam Dutch, MD;  Location: [place];  Service: Vascular;  Laterality: Left;   KNEE ARTHROSCOPY  02,04   right and left   STERIOD INJECTION  08/01/2011   Procedure: STEROID INJECTION;  Surgeon: Cammie Sickle., MD;  Location: [place];  Service: Orthopedics;  Laterality: Left;  cmc left thumb   TRIGGER  FINGER RELEASE  2006   rt thumb   TRIGGER FINGER RELEASE  08/01/2011   Procedure: RELEASE TRIGGER FINGER/A-1 PULLEY;  Surgeon: Cammie Sickle., MD;  Location: [place];  Service: Orthopedics;  Laterality: Left;  left thumb   YAG LASER APPLICATION  87/86/7672   Procedure: YAG LASER APPLICATION;  Surgeon: Myrtha Mantis., MD;  Location: [place];  Service: Ophthalmology;  Laterality: Left;   Patient Active Problem List   Diagnosis Date Noted   Pain in left hand 06/29/2021   Cerebrovascular disease 05/11/2020   Gout 03/09/2020   Allergic rhinitis 01/25/2020   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   Colon cancer screening 01/25/2020   Gastroesophageal reflux disease without esophagitis 01/25/2020   Hyperlipidemia 01/25/2020   Morbid obesity ([place]) 01/25/2020   Personal history of colonic polyps 01/25/2020   OSA (obstructive sleep apnea) 01/25/2020   Vitamin D deficiency 01/25/2020   Controlled type 2 diabetes mellitus without complication ([place]) 09/47/0962   Chronic kidney disease, stage 4 (severe) ([place]) 06/29/2019   Long term (current) use of insulin ([place]) 06/29/2019   Epidural lipomatosis 03/02/2019   Spondylolisthesis of lumbar region 03/02/2019   Spinal stenosis of lumbar region with neurogenic claudication 03/02/2019   Left wrist tendinitis 09/07/2018   Localized osteoarthritis  of left knee 04/03/2018   Celiac disease 08/21/2016   Abnormal thyroid function test 04/01/2016   Pain in thumb joint with movement of right hand 09/14/2015   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   Moderate major depression, single episode ([place]) 02/02/2010   Diabetic renal disease ([place]) 06/07/2009   Anemia 01/18/2009   Essential hypertension 01/18/2009     THERAPY DIAG:  Chronic pain of right knee  Chronic pain of left knee  Difficulty in walking, not elsewhere classified  Muscle weakness (generalized)  Unsteadiness on feet  Localized edema   PCP: Willey Blade    REFERRING PROVIDER: Newt Minion, MD   REFERRING DIAG: [phone] (ICD-10-CM) - Primary osteoarthritis of left knee   Rationale for Evaluation and Treatment Rehabilitation  ONSET DATE: 10/01/2021   SUBJECTIVE:   SUBJECTIVE STATEMENT:   Nothing new, the other day my doctor had prescribed me a new medicine maybe Olympis? Haven't started it yet. Knee is sore. I've noticed some improvement with therapy, able to get up a little bit better and my arms are getting stronger. Planning TKR but not sure of exact date.  PERTINENT HISTORY: HPI: Patient is a 66 year old woman who presents in follow-up for osteoarthritis left knee.  Patient currently ambulates with a cane and has difficulty with activities of daily living due to knee arthritis.  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    PAIN:  Are you having pain? Yes: NPRS scale: 7/10 currently Pain location: Lt knee  Pain description: dull pain, audible grinding  Aggravating factors: bending knees to sit down, steps, going from sit to stand  Relieving factors: sitting down/getting weight off of LEs   PRECAUTIONS: None  WEIGHT BEARING RESTRICTIONS No  FALLS:  Has patient fallen in last 6 months? Yes. 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  Lives in: House/apartment Stairs: 1 STE no rails, 1 step inside the home  Has following equipment at home: Single point cane and toilet riser with rails   OCCUPATION: retired   PLOF: Independent, Independent with basic ADLs, Independent with gait, and Independent with transfers  [place] for knees not to hurt and be able to walk without a cane, improve balance    OBJECTIVE:    PATIENT SURVEYS:  Eval:  FOTO 44; 10th visit on 11/23/21 54  COGNITION: Overall cognitive status: Within functional limits for tasks assessed     SENSATION: Not tested but reports some numbness in both thighs for past 5-6 years does have hx of back pain/back issues   PALPATION: Medial R knee TTP   LOWER EXTREMITY ROM:  Active ROM Right eval Left eval  Knee flexion A: 114 A: 108  Knee extension A: 3 with heel prop  A: -2 with heel prop    (Blank rows = not tested)  LOWER EXTREMITY MMT:  MMT Right eval Left eval Right 11/07/21 Left 1018/23 Right 11/23/21 Left 11/23/21  Hip flexion 4- 4- 4- 4- 4- 4-  Hip extension        Hip abduction _0 3+  Hip adduction        Hip internal rotation        Hip external rotation  Knee flexion 3+ _0 4+ 4+  Knee extension 4+ 4+ 4+ 4+ 5 4+  Ankle dorsiflexion _1 Ankle plantarflexion        Ankle inversion        Ankle eversion         (Blank rows = not tested)  FUNCTIONAL TESTS:  Eval: Berg Balance Scale: 33/56; 11/23/21 37/56  GAIT: Distance walked: in clinic distances  Assistive device utilized: Single point cane Level of assistance: Modified independence Comments: wide BOS, B LEs externally rotated, antalgic gait pattern, trunk flexed     TODAY'S TREATMENT:  11/23/21  Tests and measures for 10th visit note, appropriate education on goals and POC  Bridges with red TB x10 Supine clams red TB x12   11/19/21 NuStep L6 x 6 min UE/LE Bil LAQ 3# 3X5 Bil marching X10 bil Seated chair push up x 10 reps Seated hip add ball squeez3 5 sec X10 Seated hip abd clams green X10 Standing hamstring curl X 10 bilat Standing hip abduction x 10 reps bil Standing hip extension x 10 reps bil  -Vasopnuematic device X 10 min, medium compression, 34 deg to bilat knees   11/16/21 TherEx NuStep L6 x 6 min UE/LE Seated chair push up x 10 reps Bil LAQ 2x10; 3# Seated marching 3# 2x10 bil  Gait Training Negotiated curb x 3 backwards with  RW; min to mod cues initially progressing to supervision only.       PATIENT EDUCATION:  Education details: HEP, exam findings, POC  Person educated: Patient Education method: Explanation, Demonstration, and Handouts Education comprehension: verbalized understanding and returned demonstration   HOME EXERCISE PROGRAM: Access Code: [email or web address] URL: [email or web address] Date: 11/02/2021 Prepared by: Faustino Congress  Exercises - Sitting Knee Extension with Resistance  - 2 x daily - 7 x weekly - 1 sets - 10 reps - 2 hold - Seated Hamstring Curls with Resistance  - 2 x daily - 7 x weekly - 1 sets - 10 reps - 2 hold - Seated Hip Abduction with Resistance  - 2 x daily - 7 x weekly - 1 sets - 10 reps - 2 hold - Long Sitting Quad Set  - 3 x daily - 7 x weekly - 1 sets - 10 reps - 5 sec hold - Supine Heel Slide  - 1 x daily - 7 x weekly - 1 sets - 10 reps - Seated Chair Push Ups  - 2-3 x daily - 7 x weekly - 1 sets - 5-10 reps  ASSESSMENT:  CLINICAL IMPRESSION:  Cerra arrives doing well today, we took objectives measures/reviewed goals for progress note. Making progress with objective measures, however progress in functional situations out of clinic seems limited. In agreement to finish out current POC and DC to advanced HEP on 11/17- will make sure she has good independent program so she is fully ready for upcoming TKR procedure.   OBJECTIVE IMPAIRMENTS Abnormal gait, decreased balance, decreased knowledge of use of DME, decreased mobility, difficulty walking, decreased ROM, decreased strength, decreased safety awareness, hypomobility, obesity, and pain.   ACTIVITY LIMITATIONS standing, squatting, stairs, transfers, bed mobility, locomotion level, and caring for others  PARTICIPATION LIMITATIONS: cleaning, laundry, shopping, community activity, and yard work  PERSONAL FACTORS Age, Behavior pattern, Fitness, Past/current experiences, Social background, and Time since onset  of injury/illness/exacerbation are also affecting patient's functional outcome.   REHAB POTENTIAL: Good  CLINICAL DECISION MAKING: Stable/uncomplicated  EVALUATION COMPLEXITY: Low   GOALS: Goals  reviewed with patient? Yes  SHORT TERM GOALS: Target date: 11/09/2021  Will be compliant with appropriate progressive HEP  Baseline: Goal status: MET 11/07/21  2.  Pain in B knees to be no more than 6/10  Baseline:  Goal status: 11/03 ONGOING "has maybe gotten a little worse"   3.  Will be able to name 3 way to reduce fall risk at home and in the community  Baseline:  Goal status:  11/03 ONGOING able to name some ways but  only 1/3    LONG TERM GOALS: Target date: 12/07/2021   MMT to improve by at least 1 grade in all weak groups  Baseline:  Goal status: 11/03 ONGOING improving   2.  Will score at least 43 on the Berg to show improved balance/reduced fall risk  Baseline:  Goal status: 11/03 ONGOING 37/56  3.  Pain in B knees to be no more than 4/10 at worst  Baseline:  Goal status: 11/03 ONGOING   4.  Will be able to tolerate trips to the grocery store without increased pain in order to improve community access  Baseline:  Goal status: 11/03 DEFERRED family is not letting her to go grocery store right now/they are shopping for her   5.  Will be compliant with gym based HEP to help maintain readiness for potential surgery  Baseline:  Goal status: 11/03 ONGOING    PLAN: PT FREQUENCY: 2x/week  PT DURATION: 8 weeks  PLANNED INTERVENTIONS: Therapeutic exercises, Therapeutic activity, Neuromuscular re-education, Balance training, Gait training, Patient/Family education, Self Care, Joint mobilization, DME instructions, Dry Needling, Electrical stimulation, Cryotherapy, Moist heat, Taping, Ultrasound, Ionotophoresis 76m/ml Dexamethasone, Manual therapy, and Re-evaluation  PLAN FOR NEXT SESSION: continue strength/balance, work on step navigation with RW; DC to advanced HEP 11/17  per pt request    Tirsa Gail U PT DPT PN2  11/23/2021, 11:44 AM

## 2021-11-23 NOTE — Therapy (Unsigned)
OUTPATIENT PHYSICAL THERAPY TREATMENT NOTE   Patient Name: Chelsey Lee MRN: [id number] DOB:02-Sep-1955, 66 y.o., female Today's Date: 11/26/2021  END OF SESSION:   PT End of Session - 11/26/21 1218     Visit Number 11    Number of Visits 17    Date for PT Re-Evaluation 12/07/21    Authorization Type Medicare and BCBS    Authorization Time Period 10/12/21 to 12/07/21    Progress Note Due on Visit 20    PT Start Time 1145    PT Stop Time 1230    PT Time Calculation (min) 45 min    Activity Tolerance Patient tolerated treatment well    Behavior During Therapy [facility] for tasks assessed/performed                 Past Medical History:  Diagnosis Date   Arthritis    Chronic kidney disease    stage 4   Diabetes mellitus    GERD (gastroesophageal reflux disease)    Hyperlipemia    Hypertension    Seasonal allergies    Sleep apnea    Past Surgical History:  Procedure Laterality Date   AV FISTULA PLACEMENT Left 04/10/2020   Procedure: LEFT ARM ARTERIOVENOUS (AV) FISTULA CREATION;  Surgeon: Elam Dutch, MD;  Location: [place];  Service: Vascular;  Laterality: Left;   CAPSULOTOMY  01/13/2012   Procedure: MINOR CAPSULOTOMY;  Surgeon: Myrtha Mantis., MD;  Location: [place];  Service: Ophthalmology;  Laterality: Left;   DORSAL COMPARTMENT RELEASE  2009   rt    FISTULA SUPERFICIALIZATION Left 05/29/2020   Procedure: LEFT UPPER ARM FISTULA SUPERFICIALIZATION WITH LIGATION OF MULTIPLE SIDE BRANCHES;  Surgeon: Elam Dutch, MD;  Location: [place];  Service: Vascular;  Laterality: Left;   KNEE ARTHROSCOPY  02,04   right and left   STERIOD INJECTION  08/01/2011   Procedure: STEROID INJECTION;  Surgeon: Cammie Sickle., MD;  Location: [place];  Service: Orthopedics;  Laterality: Left;  cmc left thumb   TRIGGER FINGER RELEASE  2006   rt thumb   TRIGGER FINGER RELEASE  08/01/2011   Procedure: RELEASE TRIGGER FINGER/A-1 PULLEY;  Surgeon: Cammie Sickle., MD;  Location: [place];  Service: Orthopedics;  Laterality: Left;  left thumb   YAG LASER APPLICATION  80/99/8338   Procedure: YAG LASER APPLICATION;  Surgeon: Myrtha Mantis., MD;  Location: [place];  Service: Ophthalmology;  Laterality: Left;   Patient Active Problem List   Diagnosis Date Noted   Pain in left hand 06/29/2021   Cerebrovascular disease 05/11/2020   Gout 03/09/2020   Allergic rhinitis 01/25/2020   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   Colon cancer screening 01/25/2020   Gastroesophageal reflux disease without esophagitis 01/25/2020   Hyperlipidemia 01/25/2020   Morbid obesity ([place]) 01/25/2020   Personal history of colonic polyps 01/25/2020   OSA (obstructive sleep apnea) 01/25/2020   Vitamin D deficiency 01/25/2020   Controlled type 2 diabetes mellitus without complication ([place]) 25/05/3974   Chronic kidney disease, stage 4 (severe) ([place]) 06/29/2019   Long term (current) use of insulin ([place]) 06/29/2019   Epidural lipomatosis 03/02/2019   Spondylolisthesis of lumbar region 03/02/2019   Spinal stenosis of lumbar region with neurogenic claudication 03/02/2019   Left wrist tendinitis 09/07/2018   Localized osteoarthritis of left knee 04/03/2018   Celiac disease 08/21/2016   Abnormal thyroid function test 04/01/2016   Pain in thumb joint with movement  of right hand 09/14/2015   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   Moderate major depression, single episode ([place]) 02/02/2010   Diabetic renal disease ([place]) 06/07/2009   Anemia 01/18/2009   Essential hypertension 01/18/2009     THERAPY DIAG:  Chronic pain of right knee  Difficulty in walking, not elsewhere classified  Chronic pain of left knee  Muscle weakness (generalized)  Localized edema  Unsteadiness on feet   PCP: Willey Blade   REFERRING PROVIDER: Newt Minion, MD   REFERRING DIAG: [phone] (ICD-10-CM) - Primary osteoarthritis of left knee   Rationale for  Evaluation and Treatment Rehabilitation  ONSET DATE: 10/01/2021   SUBJECTIVE:   SUBJECTIVE STATEMENT: She is having more pain in the left knee  PERTINENT HISTORY: HPI: Patient is a 66 year old woman who presents in follow-up for osteoarthritis left knee.  Patient currently ambulates with a cane and has difficulty with activities of daily living due to knee arthritis.  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    PAIN:  Are you having pain? Yes: NPRS scale: 7/10 currently Pain location: Lt knee  Pain description: dull pain, audible grinding  Aggravating factors: bending knees to sit down, steps, going from sit to stand  Relieving factors: sitting down/getting weight off of LEs   PRECAUTIONS: None  WEIGHT BEARING RESTRICTIONS No  FALLS:  Has patient fallen in last 6 months? Yes. 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  Lives in: House/apartment Stairs: 1 STE no rails, 1 step inside the home  Has following equipment at home: Single point cane and toilet riser with rails   OCCUPATION: retired   PLOF: Independent, Independent with basic ADLs, Independent with gait, and Independent with transfers  [place] for knees not to hurt and be able to walk without a cane, improve balance    OBJECTIVE:    PATIENT SURVEYS:  Eval: FOTO 44 11/26/2021: 54.5576% COGNITION: Overall cognitive status: Within functional limits for tasks assessed     SENSATION: Not tested but reports some numbness in both thighs for past 5-6 years does have hx of back pain/back issues   PALPATION: Medial R knee TTP   LOWER EXTREMITY ROM:  Active ROM Right eval Left eval R/L 11/26/2021  Knee flexion A: 114 A:  108 108/105  Knee extension A: 3 with heel prop  A: -2 with heel prop  3/0   (Blank rows = not tested)  LOWER EXTREMITY MMT:  MMT Right eval Left eval Right 11/07/21 Left 1018/23 R/L 11/26/2021  Hip flexion 4- 4- 4- 4- 4-/4-  Hip extension       Hip abduction 3 3     Hip adduction       Hip internal rotation       Hip external rotation       Knee flexion 3+ _0 4+/4+  Knee extension 4+ 4+ 4+ 4+ 4+/4+  Ankle dorsiflexion 5 5     Ankle plantarflexion       Ankle inversion       Ankle eversion        (Blank rows = not tested)  FUNCTIONAL TESTS:  Eval: Berg Balance Scale: 33/56  GAIT: Distance walked: in clinic distances  Assistive device utilized: Single  point cane Level of assistance: Modified independence Comments: wide BOS, B LEs externally rotated, antalgic gait pattern, trunk flexed     TODAY'S TREATMENT: 11/26/2021 NuStep L6 x 6 min UE/LE Bil LAQ 3# 3X5 Bil marching X10 bil Seated chair push up x 10 reps Seated hip add ball squeeze 5 sec X10 Seated hip abd clams green X15 Standing hamstring curl X 10 bilat Standing hip abduction x 10 reps bil Standing hip extension x 10 reps bil  -Vasopnuematic device X 10 min, medium compression, 34 deg to L knees  11/19/21 NuStep L6 x 6 min UE/LE Bil LAQ 3# 3X5 Bil marching X10 bil Seated chair push up x 10 reps Seated hip add ball squeez3 5 sec X10 Seated hip abd clams green X10 Standing hamstring curl X 10 bilat Standing hip abduction x 10 reps bil Standing hip extension x 10 reps bil  -Vasopnuematic device X 10 min, medium compression, 34 deg to bilat knees   11/16/21 TherEx NuStep L6 x 6 min UE/LE Seated chair push up x 10 reps Bil LAQ 2x10; 3# Seated marching 3# 2x10 bil  Gait Training Negotiated curb x 3 backwards with RW; min to mod cues initially progressing to supervision only.    PATIENT EDUCATION:  Education details: HEP, exam findings, POC  Person educated: Patient Education method:  Explanation, Demonstration, and Handouts Education comprehension: verbalized understanding and returned demonstration   HOME EXERCISE PROGRAM: Access Code: [email or web address] URL: [email or web address] Date: 11/02/2021 Prepared by: Faustino Congress  Exercises - Sitting Knee Extension with Resistance  - 2 x daily - 7 x weekly - 1 sets - 10 reps - 2 hold - Seated Hamstring Curls with Resistance  - 2 x daily - 7 x weekly - 1 sets - 10 reps - 2 hold - Seated Hip Abduction with Resistance  - 2 x daily - 7 x weekly - 1 sets - 10 reps - 2 hold - Long Sitting Quad Set  - 3 x daily - 7 x weekly - 1 sets - 10 reps - 5 sec hold - Supine Heel Slide  - 1 x daily - 7 x weekly - 1 sets - 10 reps - Seated Chair Push Ups  - 2-3 x daily - 7 x weekly - 1 sets - 5-10 reps  ASSESSMENT:  CLINICAL IMPRESSION: CATALINA SALASAR has progressed fair with therapy. While pt has not made much progress with her functional mobility, ROM or strength, she has been able to maintain her current level of function. She is reporting less notable pain in her L knee with functional movements. Pt reports that she is seeking out a TKA due to continued pain and stiffness present in her knee that is resulting in lifestyle changes and functional limitations She has been made aware of the procedure and recovery process. Please see GOALS section for progress on short term and long term goals established at evaluation.  I recommend D/C home with HEP at the end of her POC; pt agrees with plan.  OBJECTIVE IMPAIRMENTS Abnormal gait, decreased balance, decreased knowledge of use of DME, decreased mobility, difficulty walking, decreased ROM, decreased strength, decreased safety awareness, hypomobility, obesity, and pain.   ACTIVITY LIMITATIONS standing, squatting, stairs, transfers, bed mobility, locomotion level, and caring for others  PARTICIPATION LIMITATIONS: cleaning, laundry, shopping, community activity, and yard work  PERSONAL  FACTORS Age, Behavior pattern, Fitness, Past/current experiences, Social background, and Time since onset of injury/illness/exacerbation are also affecting patient's functional outcome.   REHAB POTENTIAL: Good  CLINICAL DECISION MAKING: Stable/uncomplicated  EVALUATION COMPLEXITY: Low   GOALS: Goals reviewed with patient? Yes  SHORT TERM GOALS: Target date: 11/09/2021  Will be compliant with appropriate progressive HEP  Baseline: Goal status: MET 11/07/21  2.  Pain in B knees to be no more than 6/10  Baseline:  Goal status: MET 3/10 11/26/2021  3.  Will be able to name 3 way to reduce fall risk at home and in the community  Baseline:  Goal status: MET 11/26/2021   LONG TERM GOALS: Target date: 12/07/2021   MMT to improve by at least 1 grade in all weak groups  Baseline:  Goal status: ongoing  2.  Will score at least 43 on the Berg to show improved balance/reduced fall risk  Baseline:  Goal status: ongoing  3.  Pain in B knees to be no more than 4/10 at worst  Baseline:  Goal status: MET 04/10 is worst 11/26/2021  4.  Will be able to tolerate trips to the grocery store without increased pain in order to improve community access  Baseline:  Goal status: ongoing  5.  Will be compliant with gym based HEP to help maintain readiness for potential surgery  Baseline:  Goal status: ongoing   PLAN: PT FREQUENCY: 2x/week  PT DURATION: 8 weeks  PLANNED INTERVENTIONS: Therapeutic exercises, Therapeutic activity, Neuromuscular re-education, Balance training, Gait training, Patient/Family education, Self Care, Joint mobilization, DME instructions, Dry Needling, Electrical stimulation, Cryotherapy, Moist heat, Taping, Ultrasound, Ionotophoresis 72m/ml Dexamethasone, Manual therapy, and Re-evaluation  PLAN FOR NEXT SESSION: She will need 10th visit progress note, did the vaso help any last time?   SRudi HeapPT, DPT 11/26/21  12:21 PM

## 2021-11-26 ENCOUNTER — Ambulatory Visit (INDEPENDENT_AMBULATORY_CARE_PROVIDER_SITE_OTHER): Payer: Medicare Other | Admitting: Physical Therapy

## 2021-11-26 DIAGNOSIS — M6281 Muscle weakness (generalized): Secondary | ICD-10-CM | POA: Diagnosis not present

## 2021-11-26 DIAGNOSIS — M25561 Pain in right knee: Secondary | ICD-10-CM | POA: Diagnosis not present

## 2021-11-26 DIAGNOSIS — G8929 Other chronic pain: Secondary | ICD-10-CM

## 2021-11-26 DIAGNOSIS — R2681 Unsteadiness on feet: Secondary | ICD-10-CM

## 2021-11-26 DIAGNOSIS — R600 Localized edema: Secondary | ICD-10-CM

## 2021-11-26 DIAGNOSIS — M25562 Pain in left knee: Secondary | ICD-10-CM | POA: Diagnosis not present

## 2021-11-26 DIAGNOSIS — R262 Difficulty in walking, not elsewhere classified: Secondary | ICD-10-CM | POA: Diagnosis not present

## 2021-11-27 ENCOUNTER — Telehealth: Payer: Self-pay | Admitting: Internal Medicine

## 2021-11-27 ENCOUNTER — Other Ambulatory Visit (HOSPITAL_COMMUNITY): Payer: Self-pay

## 2021-11-27 DIAGNOSIS — G4734 Idiopathic sleep related nonobstructive alveolar hypoventilation: Secondary | ICD-10-CM

## 2021-11-27 NOTE — Telephone Encounter (Signed)
Called and spoke to sister and she states that her sister is starting to have memory loss and brain fog. This started about 2 weeks ago.   Her dialysis doctor is checking on their end. But sister is wanting to check with [place].   Patients sister is worried about the mask and the fitting?  She is wanting to know about her sister needing oxygen as well with her cpap.  Please advise sir

## 2021-11-28 ENCOUNTER — Other Ambulatory Visit (HOSPITAL_COMMUNITY): Payer: Self-pay

## 2021-11-28 MED ORDER — OZEMPIC (0.25 OR 0.5 MG/DOSE) 2 MG/3ML ~~LOC~~ SOPN
0.2500 mg | PEN_INJECTOR | SUBCUTANEOUS | 0 refills | Status: DC
  Filled 2021-11-28: qty 3, 28d supply, fill #0
  Filled 2021-12-03: qty 3, 56d supply, fill #0

## 2021-11-28 NOTE — Telephone Encounter (Signed)
Called and spoke with sister Valerie Salts and went over the recommendations from Dr Annamaria Boots. She states that she understands and that she is thankful for the overnight oxygen test order. She states they have appointment with PCP next week to determine other issues that can be going on. Nothing further needed

## 2021-11-28 NOTE — Telephone Encounter (Signed)
CPAP download shows excellent use and control of sleep apnea. We will check oxygen with overnight oximetry as ordered. They should discuss their concerns with her primary doctor.

## 2021-11-28 NOTE — Telephone Encounter (Signed)
Pulled download for Dr Annamaria Boots, will place on desk for him to look at. Order placed for ONO on room air as ordered.   Please advise on cpap download sir

## 2021-11-28 NOTE — Telephone Encounter (Signed)
Please get a current CPAP download. Please order overnight oximetry on CPAP with room air for dx nocturnal hypoxemia

## 2021-11-29 NOTE — Progress Notes (Signed)
I called and left a message for Chelsey Lee to return my call to discuss scheduling.  Has she gotten clearance from PCP, Dr. Karlton Lemon yet?

## 2021-11-29 NOTE — Progress Notes (Signed)
I called and left a message for Chelsey Lee to return my call to discuss.  Does she have clearance from PCP, Dr. Karlton Lemon?

## 2021-11-30 ENCOUNTER — Ambulatory Visit (INDEPENDENT_AMBULATORY_CARE_PROVIDER_SITE_OTHER): Payer: Medicare Other | Admitting: Physical Therapy

## 2021-11-30 DIAGNOSIS — M6281 Muscle weakness (generalized): Secondary | ICD-10-CM | POA: Diagnosis not present

## 2021-11-30 DIAGNOSIS — M25561 Pain in right knee: Secondary | ICD-10-CM

## 2021-11-30 DIAGNOSIS — R262 Difficulty in walking, not elsewhere classified: Secondary | ICD-10-CM | POA: Diagnosis not present

## 2021-11-30 DIAGNOSIS — M25562 Pain in left knee: Secondary | ICD-10-CM | POA: Diagnosis not present

## 2021-11-30 DIAGNOSIS — G8929 Other chronic pain: Secondary | ICD-10-CM

## 2021-11-30 DIAGNOSIS — R600 Localized edema: Secondary | ICD-10-CM

## 2021-11-30 NOTE — Therapy (Signed)
OUTPATIENT PHYSICAL THERAPY TREATMENT NOTE   Patient Name: Chelsey Lee MRN: [id number] DOB:[birth year], 66 y.o., female Today's Date: 11/30/2021  END OF SESSION:   PT End of Session - 11/30/21 1123     Visit Number 12    Number of Visits 17    Date for PT Re-Evaluation 12/07/21    Authorization Type Medicare and BCBS    Authorization Time Period 10/12/21 to 12/07/21    Progress Note Due on Visit 20    PT Start Time 1102    PT Stop Time 1147    PT Time Calculation (min) 45 min    Activity Tolerance Patient tolerated treatment well    Behavior During Therapy [facility] for tasks assessed/performed                  Past Medical History:  Diagnosis Date   Arthritis    Chronic kidney disease    stage 4   Diabetes mellitus    GERD (gastroesophageal reflux disease)    Hyperlipemia    Hypertension    Seasonal allergies    Sleep apnea    Past Surgical History:  Procedure Laterality Date   AV FISTULA PLACEMENT Left 04/10/2020   Procedure: LEFT ARM ARTERIOVENOUS (AV) FISTULA CREATION;  Surgeon: Elam Dutch, MD;  Location: [place];  Service: Vascular;  Laterality: Left;   CAPSULOTOMY  01/13/2012   Procedure: MINOR CAPSULOTOMY;  Surgeon: Myrtha Mantis., MD;  Location: [place];  Service: Ophthalmology;  Laterality: Left;   DORSAL COMPARTMENT RELEASE  2009   rt    FISTULA SUPERFICIALIZATION Left 05/29/2020   Procedure: LEFT UPPER ARM FISTULA SUPERFICIALIZATION WITH LIGATION OF MULTIPLE SIDE BRANCHES;  Surgeon: Elam Dutch, MD;  Location: [place];  Service: Vascular;  Laterality: Left;   KNEE ARTHROSCOPY  02,04   right and left   STERIOD INJECTION  08/01/2011   Procedure: STEROID INJECTION;  Surgeon: Cammie Sickle., MD;  Location: [place];  Service: Orthopedics;  Laterality: Left;  cmc left thumb   TRIGGER FINGER RELEASE  2006   rt thumb   TRIGGER FINGER RELEASE  08/01/2011   Procedure: RELEASE TRIGGER FINGER/A-1 PULLEY;  Surgeon: Cammie Sickle., MD;  Location: [place];  Service: Orthopedics;  Laterality: Left;  left thumb   YAG LASER APPLICATION  46/56/8127   Procedure: YAG LASER APPLICATION;  Surgeon: Myrtha Mantis., MD;  Location: [place];  Service: Ophthalmology;  Laterality: Left;   Patient Active Problem List   Diagnosis Date Noted   Pain in left hand 06/29/2021   Cerebrovascular disease 05/11/2020   Gout 03/09/2020   Allergic rhinitis 01/25/2020   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   Colon cancer screening 01/25/2020   Gastroesophageal reflux disease without esophagitis 01/25/2020   Hyperlipidemia 01/25/2020   Morbid obesity ([place]) 01/25/2020   Personal history of colonic polyps 01/25/2020   OSA (obstructive sleep apnea) 01/25/2020   Vitamin D deficiency 01/25/2020   Controlled type 2 diabetes mellitus without complication ([place]) 51/70/0174   Chronic kidney disease, stage 4 (severe) ([place]) 06/29/2019   Long term (current) use of insulin ([place]) 06/29/2019   Epidural lipomatosis 03/02/2019   Spondylolisthesis of lumbar region 03/02/2019   Spinal stenosis of lumbar region with neurogenic claudication 03/02/2019   Left wrist tendinitis 09/07/2018   Localized osteoarthritis of left knee 04/03/2018   Celiac disease 08/21/2016   Abnormal thyroid function test 04/01/2016   Pain in thumb joint with  movement of right hand 09/14/2015   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   Moderate major depression, single episode ([place]) 02/02/2010   Diabetic renal disease ([place]) 06/07/2009   Anemia 01/18/2009   Essential hypertension 01/18/2009     THERAPY DIAG:  Chronic pain of right knee  Difficulty in walking, not elsewhere classified  Chronic pain of left knee  Muscle weakness (generalized)  Localized edema   PCP: Willey Blade   REFERRING PROVIDER: Newt Minion, MD   REFERRING DIAG: [phone] (ICD-10-CM) - Primary osteoarthritis of left knee   Rationale for Evaluation and Treatment  Rehabilitation  ONSET DATE: 10/01/2021   SUBJECTIVE:   SUBJECTIVE STATEMENT: She is having more pain in the left knee after the weather has changed. She has not heard anything about when surgery will be scheduled but says she will call about this.  PERTINENT HISTORY: HPI: Patient is a 66 year old woman who presents in follow-up for osteoarthritis left knee.  Patient currently ambulates with a cane and has difficulty with activities of daily living due to knee arthritis.  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    PAIN:  Are you having pain? Yes: NPRS scale: 9/10 currently Pain location: Lt knee  Pain description: dull pain, audible grinding  Aggravating factors: bending knees to sit down, steps, going from sit to stand  Relieving factors: sitting down/getting weight off of LEs   PRECAUTIONS: None  WEIGHT BEARING RESTRICTIONS No  FALLS:  Has patient fallen in last 6 months? Yes. 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  Lives in: House/apartment Stairs: 1 STE no rails, 1 step inside the home  Has following equipment at home: Single point cane and toilet riser with rails   OCCUPATION: retired   PLOF: Independent, [place] with basic ADLs, Independent with gait, and Independent with transfers  [place] for knees not to hurt and be able to walk without a cane, improve balance    OBJECTIVE:    PATIENT SURVEYS:  Eval: FOTO 44 11/26/2021: 54.5576% COGNITION: Overall cognitive status: Within functional limits for tasks assessed     SENSATION: Not tested but reports some numbness in both thighs for past 5-6 years does have hx of back pain/back issues   PALPATION: Medial R knee TTP    LOWER EXTREMITY ROM:  Active ROM Right eval Left eval R/L 11/26/2021  Knee flexion A: 114 A: 108 108/105  Knee extension A: 3 with heel prop  A: -2 with heel prop  3/0   (Blank rows = not tested)  LOWER EXTREMITY MMT:  MMT Right eval Left eval Right 11/07/21 Left 1018/23 R/L 11/26/2021  Hip flexion 4- 4- 4- 4- 4-/4-  Hip extension       Hip abduction 3 3     Hip adduction       Hip internal rotation       Hip external rotation       Knee flexion 3+ _0 4+/4+  Knee extension 4+ 4+ 4+ 4+ 4+/4+  Ankle dorsiflexion 5 5     Ankle plantarflexion       Ankle inversion       Ankle eversion        (Blank rows = not tested)  FUNCTIONAL TESTS:  Eval: Berg Balance Scale: 33/56  GAIT: Distance walked: in clinic distances  Assistive device utilized: Single point cane Level of assistance: Modified independence Comments: wide BOS, B LEs externally rotated, antalgic gait pattern, trunk flexed     TODAY'S TREATMENT: 11/30/2021 NuStep L6 x 6 min UE/LE Bil LAQ 3# 2X5 Bil marching X5 bil Seated chair push up x 5 reps Seated knee flexion AAROM stretch 5 sec X 5 nilat Standing hamstring curl X 5 bilat Standing hip abduction x 5 reps bil Standing hip extension x 5 reps bil  -Vasopnuematic device X 10 min, medium compression, 34 deg to bilat knees  11/26/2021 NuStep L6 x 6 min UE/LE Bil LAQ 3# 3X5 Bil marching X10 bil Seated chair push up x 10 reps Seated hip add ball squeeze 5 sec X10 Seated hip abd clams green X15 Standing hamstring curl X 10 bilat Standing hip abduction x 10 reps bil Standing hip extension x 10 reps bil  -Vasopnuematic device X 10 min, medium compression, 34 deg to L knees  PATIENT EDUCATION:  Education details: HEP, exam findings, POC  Person educated: Patient Education method: Explanation, Demonstration, and Handouts Education comprehension: verbalized understanding and returned demonstration   HOME EXERCISE PROGRAM: Access Code:  [email or web address] URL: [email or web address] Date: 11/02/2021 Prepared by: Faustino Congress  Exercises - Sitting Knee Extension with Resistance  - 2 x daily - 7 x weekly - 1 sets - 10 reps - 2 hold - Seated Hamstring Curls with Resistance  - 2 x daily - 7 x weekly - 1 sets - 10 reps - 2 hold - Seated Hip Abduction with Resistance  - 2 x daily - 7 x weekly - 1 sets - 10 reps - 2 hold - Long Sitting Quad Set  - 3 x daily - 7 x weekly - 1 sets - 10 reps - 5 sec hold - Supine Heel Slide  - 1 x daily - 7 x weekly - 1 sets - 10 reps - Seated Chair Push Ups  - 2-3 x daily - 7 x weekly - 1 sets - 5-10 reps  ASSESSMENT:  CLINICAL IMPRESSION: She was more limited today by pain, so reduced her reps some to better accomodate her needs and provided vaspnuematic at the end at her request. She will call to coordinate when her TKA will get scheduled.   OBJECTIVE IMPAIRMENTS Abnormal gait, decreased balance, decreased knowledge of use of DME, decreased mobility, difficulty walking, decreased ROM, decreased strength, decreased safety awareness, hypomobility, obesity, and pain.   ACTIVITY LIMITATIONS standing, squatting, stairs, transfers, bed mobility, locomotion level, and caring for others  PARTICIPATION LIMITATIONS: cleaning, laundry, shopping, community activity, and yard work  PERSONAL FACTORS Age, Behavior pattern, Fitness, Past/current experiences, Social background, and Time since onset of injury/illness/exacerbation are also affecting patient's functional outcome.   REHAB POTENTIAL: Good  CLINICAL DECISION MAKING: Stable/uncomplicated  EVALUATION COMPLEXITY: Low   GOALS: Goals reviewed with patient? Yes  SHORT TERM GOALS: Target date: 11/09/2021  Will be compliant with appropriate progressive HEP  Baseline: Goal status: MET 11/07/21  2.  Pain in B knees to be no more than 6/10  Baseline:  Goal status: MET 3/10 11/26/2021  3.  Will be able to name 3 way to reduce fall risk  at home and in the community  Baseline:  Goal status: MET 11/26/2021   LONG TERM GOALS: Target date: 12/07/2021   MMT to improve by at least 1 grade in all weak groups  Baseline:  Goal  status: ongoing  2.  Will score at least 43 on the Berg to show improved balance/reduced fall risk  Baseline:  Goal status: ongoing  3.  Pain in B knees to be no more than 4/10 at worst  Baseline:  Goal status: MET 04/10 is worst 11/26/2021  4.  Will be able to tolerate trips to the grocery store without increased pain in order to improve community access  Baseline:  Goal status: ongoing  5.  Will be compliant with gym based HEP to help maintain readiness for potential surgery  Baseline:  Goal status: ongoing   PLAN: PT FREQUENCY: 2x/week  PT DURATION: 8 weeks  PLANNED INTERVENTIONS: Therapeutic exercises, Therapeutic activity, Neuromuscular re-education, Balance training, Gait training, Patient/Family education, Self Care, Joint mobilization, DME instructions, Dry Needling, Electrical stimulation, Cryotherapy, Moist heat, Taping, Ultrasound, Ionotophoresis 27m/ml Dexamethasone, Manual therapy, and Re-evaluation  PLAN FOR NEXT SESSION: strength as tolerated  BElsie Ra PT, DPT 11/30/21 11:33 AM

## 2021-12-03 ENCOUNTER — Ambulatory Visit (INDEPENDENT_AMBULATORY_CARE_PROVIDER_SITE_OTHER): Payer: Medicare Other | Admitting: Physical Therapy

## 2021-12-03 ENCOUNTER — Other Ambulatory Visit (HOSPITAL_COMMUNITY): Payer: Self-pay

## 2021-12-03 ENCOUNTER — Encounter: Payer: Self-pay | Admitting: Physical Therapy

## 2021-12-03 DIAGNOSIS — M6281 Muscle weakness (generalized): Secondary | ICD-10-CM | POA: Diagnosis not present

## 2021-12-03 DIAGNOSIS — R600 Localized edema: Secondary | ICD-10-CM

## 2021-12-03 DIAGNOSIS — M25562 Pain in left knee: Secondary | ICD-10-CM

## 2021-12-03 DIAGNOSIS — G8929 Other chronic pain: Secondary | ICD-10-CM

## 2021-12-03 DIAGNOSIS — R2681 Unsteadiness on feet: Secondary | ICD-10-CM

## 2021-12-03 DIAGNOSIS — R262 Difficulty in walking, not elsewhere classified: Secondary | ICD-10-CM | POA: Diagnosis not present

## 2021-12-03 DIAGNOSIS — M25561 Pain in right knee: Secondary | ICD-10-CM | POA: Diagnosis not present

## 2021-12-03 NOTE — Therapy (Signed)
OUTPATIENT PHYSICAL THERAPY TREATMENT NOTE   Patient Name: Chelsey Lee MRN: [id number] DOB:1955/09/05, 66 y.o., female Today's Date: 12/03/2021  END OF SESSION:   PT End of Session - 12/03/21 1142     Visit Number 13    Number of Visits 17    Date for PT Re-Evaluation 12/07/21    Authorization Type Medicare and BCBS    Authorization Time Period 10/12/21 to 12/07/21    Progress Note Due on Visit 20    PT Start Time 1136    PT Stop Time 1227    PT Time Calculation (min) 51 min    Activity Tolerance Patient tolerated treatment well    Behavior During Therapy [facility] for tasks assessed/performed                   Past Medical History:  Diagnosis Date   Arthritis    Chronic kidney disease    stage 4   Diabetes mellitus    GERD (gastroesophageal reflux disease)    Hyperlipemia    Hypertension    Seasonal allergies    Sleep apnea    Past Surgical History:  Procedure Laterality Date   AV FISTULA PLACEMENT Left 04/10/2020   Procedure: LEFT ARM ARTERIOVENOUS (AV) FISTULA CREATION;  Surgeon: Elam Dutch, MD;  Location: [place];  Service: Vascular;  Laterality: Left;   CAPSULOTOMY  01/13/2012   Procedure: MINOR CAPSULOTOMY;  Surgeon: Myrtha Mantis., MD;  Location: [place];  Service: Ophthalmology;  Laterality: Left;   DORSAL COMPARTMENT RELEASE  2009   rt    FISTULA SUPERFICIALIZATION Left 05/29/2020   Procedure: LEFT UPPER ARM FISTULA SUPERFICIALIZATION WITH LIGATION OF MULTIPLE SIDE BRANCHES;  Surgeon: Elam Dutch, MD;  Location: [place];  Service: Vascular;  Laterality: Left;   KNEE ARTHROSCOPY  02,04   right and left   STERIOD INJECTION  08/01/2011   Procedure: STEROID INJECTION;  Surgeon: Cammie Sickle., MD;  Location: [place];  Service: Orthopedics;  Laterality: Left;  cmc left thumb   TRIGGER FINGER RELEASE  2006   rt thumb   TRIGGER FINGER RELEASE  08/01/2011   Procedure: RELEASE TRIGGER FINGER/A-1 PULLEY;  Surgeon: Cammie Sickle., MD;  Location: [place];  Service: Orthopedics;  Laterality: Left;  left thumb   YAG LASER APPLICATION  29/52/8413   Procedure: YAG LASER APPLICATION;  Surgeon: Myrtha Mantis., MD;  Location: [place];  Service: Ophthalmology;  Laterality: Left;   Patient Active Problem List   Diagnosis Date Noted   Pain in left hand 06/29/2021   Cerebrovascular disease 05/11/2020   Gout 03/09/2020   Allergic rhinitis 01/25/2020   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   Colon cancer screening 01/25/2020   Gastroesophageal reflux disease without esophagitis 01/25/2020   Hyperlipidemia 01/25/2020   Morbid obesity ([place]) 01/25/2020   Personal history of colonic polyps 01/25/2020   OSA (obstructive sleep apnea) 01/25/2020   Vitamin D deficiency 01/25/2020   Controlled type 2 diabetes mellitus without complication ([place]) 24/40/1027   Chronic kidney disease, stage 4 (severe) ([place]) 06/29/2019   Long term (current) use of insulin ([place]) 06/29/2019   Epidural lipomatosis 03/02/2019   Spondylolisthesis of lumbar region 03/02/2019   Spinal stenosis of lumbar region with neurogenic claudication 03/02/2019   Left wrist tendinitis 09/07/2018   Localized osteoarthritis of left knee 04/03/2018   Celiac disease 08/21/2016   Abnormal thyroid function test 04/01/2016   Pain in thumb joint  with movement of right hand 09/14/2015   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   Moderate major depression, single episode ([place]) 02/02/2010   Diabetic renal disease ([place]) 06/07/2009   Anemia 01/18/2009   Essential hypertension 01/18/2009     THERAPY DIAG:  Chronic pain of right knee  Difficulty in walking, not elsewhere classified  Chronic pain of left knee  Muscle weakness (generalized)  Localized edema  Unsteadiness on feet   PCP: Willey Blade   REFERRING PROVIDER: Newt Minion, MD   REFERRING DIAG: [phone] (ICD-10-CM) - Primary osteoarthritis of left knee   Rationale for  Evaluation and Treatment Rehabilitation  ONSET DATE: 10/01/2021   SUBJECTIVE:   SUBJECTIVE STATEMENT: doing okay, she's still having a lot of pain    PERTINENT HISTORY: HPI: Patient is a 66 year old woman who presents in follow-up for osteoarthritis left knee.  Patient currently ambulates with a cane and has difficulty with activities of daily living due to knee arthritis.  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    PAIN:  Are you having pain? Yes: NPRS scale: 8/10 currently Pain location: Lt knee  Pain description: dull pain, audible grinding  Aggravating factors: bending knees to sit down, steps, going from sit to stand  Relieving factors: sitting down/getting weight off of LEs   PRECAUTIONS: None  WEIGHT BEARING RESTRICTIONS No  FALLS:  Has patient fallen in last 6 months? Yes. 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  Lives in: House/apartment Stairs: 1 STE no rails, 1 step inside the home  Has following equipment at home: Single point cane and toilet riser with rails   OCCUPATION: retired   PLOF: Independent, Independent with basic ADLs, Independent with gait, and Independent with transfers  [place] for knees not to hurt and be able to walk without a cane, improve balance    OBJECTIVE:    PATIENT SURVEYS:  Eval: FOTO 44 11/26/2021: 54.5576%  COGNITION: Overall cognitive status: Within functional limits for tasks assessed     SENSATION: Not tested but reports some numbness in both thighs for past 5-6 years does have hx of back pain/back issues   PALPATION: Medial R knee TTP   LOWER EXTREMITY ROM:  Active ROM Right eval Left eval R/L 11/26/2021  Knee flexion A:  114 A: 108 108/105  Knee extension A: 3 with heel prop  A: -2 with heel prop  3/0   (Blank rows = not tested)  LOWER EXTREMITY MMT:  MMT Right eval Left eval Right 11/07/21 Left 1018/23 R/L 11/26/2021 Rt/Lt 12/03/21  Hip flexion 4- 4- 4- 4- 4-/4- 4/4-  Hip extension        Hip abduction 3 3    4/4  Hip adduction        Hip internal rotation        Hip external rotation        Knee flexion 3+ _0 4+/4+ 4+/4+  Knee extension 4+ 4+ 4+ 4+ 4+/4+ 4+/4+  Ankle dorsiflexion 5 5      Ankle plantarflexion        Ankle inversion        Ankle eversion         (Blank rows = not tested)  FUNCTIONAL  TESTS:  Eval: Berg Balance Scale: 33/56  GAIT: Distance walked: in clinic distances  Assistive device utilized: Single point cane Level of assistance: Modified independence Comments: wide BOS, B LEs externally rotated, antalgic gait pattern, trunk flexed     TODAY'S TREATMENT: 12/03/21 TherEx NuStep L5 x 8 min UE/LE Bil LAQ 3# 2x10 Bil seated marching 3# 2x10 Sit to/from stand from elevated surface with min UE support (hands on thighs) x 5 reps Bil quad sets x10 reps AA heel slides x 10 reps; Lt  Modalities Vasopnuematic device X 10 min, medium compression, 34 deg to Lt knee  11/30/2021 NuStep L6 x 6 min UE/LE Bil LAQ 3# 2X5 Bil marching X5 bil Seated chair push up x 5 reps Seated knee flexion AAROM stretch 5 sec X 5 nilat Standing hamstring curl X 5 bilat Standing hip abduction x 5 reps bil Standing hip extension x 5 reps bil  -Vasopnuematic device X 10 min, medium compression, 34 deg to bilat knees  11/26/2021 NuStep L6 x 6 min UE/LE Bil LAQ 3# 3X5 Bil marching X10 bil Seated chair push up x 10 reps Seated hip add ball squeeze 5 sec X10 Seated hip abd clams green X15 Standing hamstring curl X 10 bilat Standing hip abduction x 10 reps bil Standing hip extension x 10 reps bil  -Vasopnuematic device X 10 min, medium compression, 34 deg to L knees  PATIENT  EDUCATION:  Education details: HEP, exam findings, POC  Person educated: Patient Education method: Explanation, Demonstration, and Handouts Education comprehension: verbalized understanding and returned demonstration   HOME EXERCISE PROGRAM: Access Code: [email or web address] URL: [email or web address] Date: 12/03/2021 Prepared by: Faustino Congress  Exercises - Sitting Knee Extension with Resistance  - 2 x daily - 7 x weekly - 1 sets - 10 reps - 2 hold - Seated Hamstring Curls with Resistance  - 2 x daily - 7 x weekly - 1 sets - 10 reps - 2 hold - Seated Hip Abduction with Resistance  - 2 x daily - 7 x weekly - 1 sets - 10 reps - 2 hold - Long Sitting Quad Set  - 3 x daily - 7 x weekly - 1 sets - 10 reps - 5 sec hold - Supine Heel Slide with Strap  - 3 x daily - 7 x weekly - 1 sets - 10 reps - Seated Chair Push Ups  - 2-3 x daily - 7 x weekly - 1 sets - 5-10 reps - Tricep Dip on Counter  - 1 x daily - 7 x weekly - 2-3 sets - 10 reps  ASSESSMENT:  CLINICAL IMPRESSION: Plan for d/c this week and pt to continue working on HEP up to surgery.  Tolerated session well today without increase in pain.  OBJECTIVE IMPAIRMENTS Abnormal gait, decreased balance, decreased knowledge of use of DME, decreased mobility, difficulty walking, decreased ROM, decreased strength, decreased safety awareness, hypomobility, obesity, and pain.   ACTIVITY LIMITATIONS standing, squatting, stairs, transfers, bed mobility, locomotion level, and caring for others  PARTICIPATION LIMITATIONS: cleaning, laundry, shopping, community activity, and yard work  PERSONAL FACTORS Age, Behavior pattern, Fitness, Past/current experiences, Social background, and Time since onset of injury/illness/exacerbation are also affecting patient's functional outcome.   REHAB POTENTIAL: Good  CLINICAL DECISION MAKING: Stable/uncomplicated  EVALUATION COMPLEXITY: Low   GOALS: Goals reviewed with patient? Yes  SHORT TERM  GOALS: Target date: 11/09/2021  Will be compliant with appropriate progressive HEP  Baseline: Goal status: MET 11/07/21  2.  Pain in B knees  to be no more than 6/10  Baseline:  Goal status: MET 3/10 11/26/2021  3.  Will be able to name 3 way to reduce fall risk at home and in the community  Baseline:  Goal status: MET 11/26/2021   LONG TERM GOALS: Target date: 12/07/2021   MMT to improve by at least 1 grade in all weak groups  Baseline:  Goal status: Partially met 12/03/21  2.  Will score at least 43 on the Berg to show improved balance/reduced fall risk  Baseline:  Goal status: ongoing  3.  Pain in B knees to be no more than 4/10 at worst  Baseline:  Goal status: MET 04/10 is worst 11/26/2021  4.  Will be able to tolerate trips to the grocery store without increased pain in order to improve community access  Baseline:  Goal status: Not met 12/03/21  5.  Will be compliant with gym based HEP to help maintain readiness for potential surgery  Baseline:  Goal status: ongoing   PLAN: PT FREQUENCY: 2x/week  PT DURATION: 8 weeks  PLANNED INTERVENTIONS: Therapeutic exercises, Therapeutic activity, Neuromuscular re-education, Balance training, Gait training, Patient/Family education, Self Care, Joint mobilization, DME instructions, Dry Needling, Electrical stimulation, Cryotherapy, Moist heat, Taping, Ultrasound, Ionotophoresis 75m/ml Dexamethasone, Manual therapy, and Re-evaluation  PLAN FOR NEXT SESSION: check final goals (HEP and BERG), d/c and wrap up any final pre surgery concerns  SLaureen Abrahams PT, DPT 12/03/21 12:19 PM

## 2021-12-04 ENCOUNTER — Other Ambulatory Visit (HOSPITAL_COMMUNITY): Payer: Self-pay

## 2021-12-05 ENCOUNTER — Other Ambulatory Visit (HOSPITAL_COMMUNITY): Payer: Self-pay

## 2021-12-05 ENCOUNTER — Ambulatory Visit (INDEPENDENT_AMBULATORY_CARE_PROVIDER_SITE_OTHER): Payer: Medicare Other | Admitting: Podiatry

## 2021-12-05 ENCOUNTER — Telehealth: Payer: Self-pay | Admitting: Orthopedic Surgery

## 2021-12-05 ENCOUNTER — Encounter: Payer: Self-pay | Admitting: Podiatry

## 2021-12-05 DIAGNOSIS — B351 Tinea unguium: Secondary | ICD-10-CM | POA: Diagnosis not present

## 2021-12-05 DIAGNOSIS — M79674 Pain in right toe(s): Secondary | ICD-10-CM | POA: Diagnosis not present

## 2021-12-05 DIAGNOSIS — M79675 Pain in left toe(s): Secondary | ICD-10-CM | POA: Diagnosis not present

## 2021-12-05 MED ORDER — AMMONIUM LACTATE 12 % EX LOTN: 1.0000 | TOPICAL_LOTION | CUTANEOUS | 0 refills | Status: AC | PRN

## 2021-12-05 MED ORDER — OZEMPIC (0.25 OR 0.5 MG/DOSE) 2 MG/3ML ~~LOC~~ SOPN
0.2500 mg | PEN_INJECTOR | SUBCUTANEOUS | 0 refills | Status: DC
  Filled 2021-12-05 (×2): qty 3, 28d supply, fill #0
  Filled 2021-12-07: qty 3, 56d supply, fill #0

## 2021-12-05 NOTE — Telephone Encounter (Signed)
Patient called needing Rx refilled Hydrocodone. The number to contact patient is [phone]

## 2021-12-05 NOTE — Progress Notes (Signed)
  Subjective:  Patient ID: Chelsey Lee, female    DOB: 03-13-1955,  MRN: [id number]  Chief Complaint  Patient presents with   Nail Problem    Nail trim    66 y.o. female returns for the above complaint.  Patient presents with thickened elongated dystrophic toenails x10 mild pain on  Sleep ambulation or pressure.  She has not been doing.  She would like for me to do it.  Denies any other acute complaints she is a diabetic.  Her sugars are controlled.  Objective:  There were no vitals filed for this visit. Podiatric Exam: Vascular: dorsalis pedis and posterior tibial pulses are palpable bilateral. Capillary return is immediate. Temperature gradient is WNL. Skin turgor WNL  Sensorium: Normal Semmes Weinstein monofilament test. Normal tactile sensation bilaterally. Nail Exam: Pt has thick disfigured discolored nails with subungual debris noted bilateral entire nail hallux through fifth toenails.  Pain on palpation to the nails. Ulcer Exam: There is no evidence of ulcer or pre-ulcerative changes or infection. Orthopedic Exam: Muscle tone and strength are WNL. No limitations in general ROM. No crepitus or effusions noted.  Skin: No Porokeratosis. No infection or ulcers    Assessment & Plan:   1. Pain due to onychomycosis of toenails of both feet     Patient was evaluated and treated and all questions answered.  Onychomycosis with pain  -Nails palliatively debrided as below. -Educated on self-care  Procedure: Nail Debridement Rationale: pain  Type of Debridement: manual, sharp debridement. Instrumentation: Nail nipper, rotary burr. Number of Nails: 10  Procedures and Treatment: Consent by patient was obtained for treatment procedures. The patient understood the discussion of treatment and procedures well. All questions were answered thoroughly reviewed. Debridement of mycotic and hypertrophic toenails, 1 through 5 bilateral and clearing of subungual debris. No ulceration, no  infection noted.  Return Visit-Office Procedure: Patient instructed to return to the office for a follow up visit 3 months for continued evaluation and treatment.  Boneta Lucks, DPM    Return in about 3 months (around 03/07/2022) for Routine Foot Care.

## 2021-12-06 ENCOUNTER — Telehealth: Payer: Self-pay | Admitting: Internal Medicine

## 2021-12-06 NOTE — Telephone Encounter (Addendum)
I called & spoke to pt's sister.  She is calling about ono order.  I told her order was placed on 11/8 and was documented today that was received.  She wanted to know why was taking so long.  I told her I would call [facility] and check on order and would give her their # so she would have it as well.  She did not want their #.  She states she is her caregiver and wants them to call her when ready to schedule the ono.  I called [facility] & spoke to Terri.  She states order was pulled last night.  I gave her sister's name & phone # and asked that they call her when ready to schedule.  Terri said she would make a note of it.  Nothing further needed.

## 2021-12-06 NOTE — Telephone Encounter (Signed)
Pt has been discussing total knee replacement but this has not been scheduled yet. She would like a refill of her hydrocodone that was filled in September. Please advise.

## 2021-12-07 ENCOUNTER — Ambulatory Visit (INDEPENDENT_AMBULATORY_CARE_PROVIDER_SITE_OTHER): Payer: Medicare Other | Admitting: Physical Therapy

## 2021-12-07 ENCOUNTER — Encounter: Payer: Self-pay | Admitting: Physical Therapy

## 2021-12-07 ENCOUNTER — Other Ambulatory Visit (HOSPITAL_COMMUNITY): Payer: Self-pay

## 2021-12-07 ENCOUNTER — Encounter: Payer: Self-pay | Admitting: Internal Medicine

## 2021-12-07 DIAGNOSIS — M25561 Pain in right knee: Secondary | ICD-10-CM | POA: Diagnosis not present

## 2021-12-07 DIAGNOSIS — M6281 Muscle weakness (generalized): Secondary | ICD-10-CM | POA: Diagnosis not present

## 2021-12-07 DIAGNOSIS — M25562 Pain in left knee: Secondary | ICD-10-CM

## 2021-12-07 DIAGNOSIS — G8929 Other chronic pain: Secondary | ICD-10-CM

## 2021-12-07 DIAGNOSIS — R262 Difficulty in walking, not elsewhere classified: Secondary | ICD-10-CM | POA: Diagnosis not present

## 2021-12-07 DIAGNOSIS — R600 Localized edema: Secondary | ICD-10-CM

## 2021-12-07 DIAGNOSIS — R2681 Unsteadiness on feet: Secondary | ICD-10-CM

## 2021-12-07 NOTE — Therapy (Signed)
OUTPATIENT PHYSICAL THERAPY TREATMENT NOTE/Discharge PHYSICAL THERAPY DISCHARGE SUMMARY  Visits from Start of Care: 14  Current functional level related to goals / functional outcomes: See below   Remaining deficits: See below   Education / Equipment: HEP  Plan:  Patient goals were partially met. Patient is being discharged due to maximizing her functional potential preoperatavely before she will have knee surgery.      Patient Name: Chelsey Lee MRN: [id number] DOB:1956/01/10, 66 y.o., female Today's Date: 12/07/2021  END OF SESSION:   PT End of Session - 12/07/21 1107     Visit Number 14    Number of Visits 17    Date for PT Re-Evaluation 12/07/21    Authorization Type Medicare and BCBS    Authorization Time Period 10/12/21 to 12/07/21    Progress Note Due on Visit 20    PT Start Time 1100    PT Stop Time 1140    PT Time Calculation (min) 40 min    Activity Tolerance Patient tolerated treatment well    Behavior During Therapy Chelsey Lee for tasks assessed/performed                   Past Medical History:  Diagnosis Date   Arthritis    Chronic kidney disease    stage 4   Diabetes mellitus    GERD (gastroesophageal reflux disease)    Hyperlipemia    Hypertension    Seasonal allergies    Sleep apnea    Past Surgical History:  Procedure Laterality Date   AV FISTULA PLACEMENT Left 04/10/2020   Procedure: LEFT ARM ARTERIOVENOUS (AV) FISTULA CREATION;  Surgeon: Chelsey Dutch, MD;  Location: [place];  Service: Vascular;  Laterality: Left;   CAPSULOTOMY  01/13/2012   Procedure: MINOR CAPSULOTOMY;  Surgeon: Chelsey Mantis., MD;  Location: [place];  Service: Ophthalmology;  Laterality: Left;   DORSAL COMPARTMENT RELEASE  2009   rt    FISTULA SUPERFICIALIZATION Left 05/29/2020   Procedure: LEFT UPPER ARM FISTULA SUPERFICIALIZATION WITH LIGATION OF MULTIPLE SIDE BRANCHES;  Surgeon: Chelsey Dutch, MD;  Location: [place];  Service: Vascular;  Laterality:  Left;   KNEE ARTHROSCOPY  02,04   right and left   STERIOD INJECTION  08/01/2011   Procedure: STEROID INJECTION;  Surgeon: Chelsey Lee., MD;  Location: [place];  Service: Orthopedics;  Laterality: Left;  cmc left thumb   TRIGGER FINGER RELEASE  2006   rt thumb   TRIGGER FINGER RELEASE  08/01/2011   Procedure: RELEASE TRIGGER FINGER/A-1 PULLEY;  Surgeon: Chelsey Lee., MD;  Location: [place];  Service: Orthopedics;  Laterality: Left;  left thumb   YAG LASER APPLICATION  89/38/1017   Procedure: YAG LASER APPLICATION;  Surgeon: Chelsey Mantis., MD;  Location: [place];  Service: Ophthalmology;  Laterality: Left;   Patient Active Problem List   Diagnosis Date Noted   Pain in left hand 06/29/2021   Cerebrovascular disease 05/11/2020   Gout 03/09/2020   Allergic rhinitis 01/25/2020   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   Colon cancer screening 01/25/2020   Gastroesophageal reflux disease without esophagitis 01/25/2020   Hyperlipidemia 01/25/2020   Morbid obesity ([place]) 01/25/2020   Personal history of colonic polyps 01/25/2020   OSA (obstructive sleep apnea) 01/25/2020   Vitamin D deficiency 01/25/2020   Controlled type 2 diabetes mellitus without complication ([place]) 51/02/5850   Chronic kidney disease, stage 4 (severe) ([place]) 06/29/2019  Long term (current) use of insulin ([place]) 06/29/2019   Epidural lipomatosis 03/02/2019   Spondylolisthesis of lumbar region 03/02/2019   Spinal stenosis of lumbar region with neurogenic claudication 03/02/2019   Left wrist tendinitis 09/07/2018   Localized osteoarthritis of left knee 04/03/2018   Celiac disease 08/21/2016   Abnormal thyroid function test 04/01/2016   Pain in thumb joint with movement of right hand 09/14/2015   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   Moderate major depression, single episode ([place]) 02/02/2010   Diabetic renal disease ([place]) 06/07/2009   Anemia 01/18/2009   Essential  hypertension 01/18/2009     THERAPY DIAG:  Chronic pain of right knee  Difficulty in walking, not elsewhere classified  Chronic pain of left knee  Muscle weakness (generalized)  Localized edema  Unsteadiness on feet   PCP: Chelsey Lee   REFERRING PROVIDER: Newt Minion, MD   REFERRING DIAG: [phone] (ICD-10-CM) - Primary osteoarthritis of left knee   Rationale for Evaluation and Treatment Rehabilitation  ONSET DATE: 10/01/2021   SUBJECTIVE:   SUBJECTIVE STATEMENT: She has 4/10 knee pain overall she plans to have elected TKA and feels ready to DC from PT today  PERTINENT HISTORY: HPI: Patient is a 66 year old woman who presents in follow-up for osteoarthritis left knee.  Patient currently ambulates with a cane and has difficulty with activities of daily living due to knee arthritis.  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    PRECAUTIONS: None  WEIGHT BEARING RESTRICTIONS No  FALLS:  Has patient fallen in last 6 months? Yes. 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  Lives in: House/apartment Stairs: 1 STE no rails, 1 step inside the home  Has following equipment at home: Single point cane and toilet riser with rails   OCCUPATION: retired   PLOF: Independent, [place] with basic ADLs, Independent with gait, and Independent with transfers  [place] for knees not to hurt and be able to walk without a cane, improve balance    OBJECTIVE:    PATIENT SURVEYS:  Eval: FOTO 44 11/26/2021: 54.5576% 12/07/21: FOTO improved to 55% and met goal    COGNITION: Overall cognitive status: Within functional limits for tasks assessed     SENSATION: Not tested but reports  some numbness in both thighs for past 5-6 years does have hx of back pain/back issues   PALPATION: Medial R knee TTP   LOWER EXTREMITY ROM:  Active ROM Right eval Left eval R/L 11/26/2021  Knee flexion A: 114 A: 108 108/105  Knee extension A: 3 with heel prop  A: -2 with heel prop  3/0   (Blank rows = not tested)  LOWER EXTREMITY MMT:  MMT Right eval Left eval Right 11/07/21 Left 1018/23 R/L 11/26/2021 Rt/Lt 12/03/21  Hip flexion 4- 4- 4- 4- 4-/4- 4/4-  Hip extension        Hip abduction 3 3    4/4  Hip adduction        Hip internal rotation        Hip external rotation        Knee flexion 3+ _0 4+/4+ 4+/4+  Knee extension 4+ 4+ 4+ 4+ 4+/4+ 4+/4+  Ankle dorsiflexion 5 5  Ankle plantarflexion        Ankle inversion        Ankle eversion         (Blank rows = not tested)  FUNCTIONAL TESTS:  Eval: Berg Balance Scale: 33/56  GAIT: Distance walked: in clinic distances  Assistive device utilized: Single point cane Level of assistance: Modified independence Comments: wide BOS, B LEs externally rotated, antalgic gait pattern, trunk flexed     TODAY'S TREATMENT: 12/07/21 -Nu step L5 X 5 min UE/LE -HEP review and performed 5 reps of each exercise listed below with cues and demo for technique and understanding -FOTO update and goals updated see below for details -Vasopnuematic device X 10 min medium compression to bilat knees  [facility] PT Assessment - 12/07/21 0001       Berg Balance Test   Sit to Stand Able to stand  independently using hands    Standing Unsupported Able to stand safely 2 minutes    Sitting with Back Unsupported but Feet Supported on Floor or Stool Able to sit safely and securely 2 minutes    Stand to Sit Controls descent by using hands    Transfers Able to transfer safely, minor use of hands    Standing Unsupported with Eyes Closed Able to stand 10 seconds with supervision    Standing Unsupported with Feet Together Able to place feet  together independently and stand for 1 minute with supervision    From Standing, Reach Forward with Outstretched Arm Can reach confidently >25 cm (10")    From Standing Position, Pick up Object from Floor Able to pick up shoe, needs supervision    From Standing Position, Turn to Look Behind Over each Shoulder Looks behind one side only/other side shows less weight shift    Turn 360 Degrees Able to turn 360 degrees safely but slowly    Standing Unsupported, Alternately Place Feet on Step/Stool Able to complete 4 steps without aid or supervision    Standing Unsupported, One Foot in Front Needs help to step but can hold 15 seconds    Standing on One Leg Tries to lift leg/unable to hold 3 seconds but remains standing independently    Total Score 40              PATIENT EDUCATION:  Education details: HEP, exam findings, POC  Person educated: Patient Education method: Explanation, Demonstration, and Handouts Education comprehension: verbalized understanding and returned demonstration   HOME EXERCISE PROGRAM: Access Code: [email or web address] URL: [email or web address] Date: 12/03/2021 Prepared by: Faustino Congress  Exercises - Sitting Knee Extension with Resistance  - 2 x daily - 7 x weekly - 1 sets - 10 reps - 2 hold - Seated Hamstring Curls with Resistance  - 2 x daily - 7 x weekly - 1 sets - 10 reps - 2 hold - Seated Hip Abduction with Resistance  - 2 x daily - 7 x weekly - 1 sets - 10 reps - 2 hold - Long Sitting Quad Set  - 3 x daily - 7 x weekly - 1 sets - 10 reps - 5 sec hold - Supine Heel Slide with Strap  - 3 x daily - 7 x weekly - 1 sets - 10 reps - Seated Chair Push Ups  - 2-3 x daily - 7 x weekly - 1 sets - 5-10 reps - Tricep Dip on Counter  - 1 x daily - 7 x weekly - 2-3 sets - 10 reps  ASSESSMENT:  CLINICAL IMPRESSION: DC today  as she has maximixed her pre op potential with PT and she will follow up with MD staff about scheduling TKA. She had no further questions  about her HEP to continue with in the mean time.   OBJECTIVE IMPAIRMENTS Abnormal gait, decreased balance, decreased knowledge of use of DME, decreased mobility, difficulty walking, decreased ROM, decreased strength, decreased safety awareness, hypomobility, obesity, and pain.   ACTIVITY LIMITATIONS standing, squatting, stairs, transfers, bed mobility, locomotion level, and caring for others  PARTICIPATION LIMITATIONS: cleaning, laundry, shopping, community activity, and yard work  PERSONAL FACTORS Age, Behavior pattern, Fitness, Past/current experiences, Social background, and Time since onset of injury/illness/exacerbation are also affecting patient's functional outcome.   REHAB POTENTIAL: Good  CLINICAL DECISION MAKING: Stable/uncomplicated  EVALUATION COMPLEXITY: Low   GOALS: Goals reviewed with patient? Yes  SHORT TERM GOALS: Target date: 11/09/2021  Will be compliant with appropriate progressive HEP  Baseline: Goal status: MET 11/07/21  2.  Pain in B knees to be no more than 6/10  Baseline:  Goal status: MET 3/10 11/26/2021  3.  Will be able to name 3 way to reduce fall risk at home and in the community  Baseline:  Goal status: MET 11/26/2021   LONG TERM GOALS: Target date: 12/07/2021   MMT to improve by at least 1 grade in all weak groups  Baseline:  Goal status: Partially met 12/07/21  2.  Will score at least 43 on the Berg to show improved balance/reduced fall risk  Baseline:  Goal status: partially met, improve to 40 on 12/07/21  3.  Pain in B knees to be no more than 4/10 at worst  Baseline:  Goal status: MET 12/07/21  4.  Will be able to tolerate trips to the grocery store without increased pain in order to improve community access  Baseline:  Goal status: not met, has improved but still with pain 12/07/21  5.  Will be compliant with gym based HEP to help maintain readiness for potential surgery  Baseline:  Goal status: MET 12/07/21   PLAN: PT  FREQUENCY: 2x/week  PT DURATION: 8 weeks  PLANNED INTERVENTIONS: Therapeutic exercises, Therapeutic activity, Neuromuscular re-education, Balance training, Gait training, Patient/Family education, Self Care, Joint mobilization, DME instructions, Dry Needling, Electrical stimulation, Cryotherapy, Moist heat, Taping, Ultrasound, Ionotophoresis 46m/ml Dexamethasone, Manual therapy, and Re-evaluation  PLAN FOR NEXT SESSION: DC today   BElsie Ra PT, DPT 12/07/21 11:09 AM

## 2021-12-11 ENCOUNTER — Telehealth: Payer: Self-pay | Admitting: Orthopedic Surgery

## 2021-12-11 MED ORDER — HYDROCODONE-ACETAMINOPHEN 5-325 MG PO TABS: 1.0000 | ORAL_TABLET | Freq: Three times a day (TID) | ORAL | 0 refills | Status: DC | PRN

## 2021-12-11 NOTE — Telephone Encounter (Signed)
I called and advised pt that rx was sent to pharm today.

## 2021-12-11 NOTE — Telephone Encounter (Signed)
Patient called in stating she needs her pain medication hydrocodone refill, please advise

## 2021-12-11 NOTE — Telephone Encounter (Signed)
Rf sent

## 2021-12-17 ENCOUNTER — Telehealth: Payer: Self-pay | Admitting: Internal Medicine

## 2021-12-17 NOTE — Telephone Encounter (Signed)
Called [facility] but was on hold for over 10 mins without speaking to anyone. Will call back later.

## 2022-01-01 ENCOUNTER — Telehealth: Payer: Self-pay | Admitting: Internal Medicine

## 2022-01-01 NOTE — Telephone Encounter (Signed)
Sister called to request the test results from an home oxygen test that the patient had.  Please advise and call to discuss w/sister at [phone]

## 2022-01-01 NOTE — Telephone Encounter (Signed)
Called [facility] and asked for them to fax over the ONO results for this patient. We are needing results before we can discuss with patient.

## 2022-01-08 NOTE — Telephone Encounter (Signed)
PT care taker Chelsey Lee calling again. Advised we called [facility] for test results on this encounter. I asked her if she called [employer] to confirm we had called but she said that's not her job to be a 3rd party. She is not going to be on hold forever "and all that mess". Please call her to advise. PT is running out of O2. Setting as priority due to this. TY.

## 2022-01-08 NOTE — Telephone Encounter (Signed)
Spoke to Sister of Chelsey Lee and informed her that I spoke to someone from [place] about her sister's ONO and I was told it will be faxed asap. I will f/up with pt's sister once we get the results of her ono. Nothing further needed at this time.

## 2022-01-09 NOTE — Telephone Encounter (Signed)
See encounter from 12/8. Will close this encounter.

## 2022-01-15 NOTE — Telephone Encounter (Signed)
Overnight oximetry on room air with CPAP showed good oxygen levels. She does not need to wear oxygen at night.

## 2022-01-15 NOTE — Telephone Encounter (Signed)
Called and spoke with pt's sister Valerie Salts letting her know the results of pt's ONO and she verbalized understanding. Nothing further needed.

## 2022-01-15 NOTE — Telephone Encounter (Signed)
Dr. Annamaria Boots, please advise if you ever received ONO results on pt.

## 2022-01-24 ENCOUNTER — Other Ambulatory Visit (HOSPITAL_COMMUNITY): Payer: Self-pay

## 2022-01-24 ENCOUNTER — Telehealth: Payer: Self-pay | Admitting: Orthopedic Surgery

## 2022-01-24 MED ORDER — OZEMPIC (0.25 OR 0.5 MG/DOSE) 2 MG/3ML ~~LOC~~ SOPN
0.2500 mg | PEN_INJECTOR | SUBCUTANEOUS | 0 refills | Status: DC
  Filled 2022-01-24: qty 3, 56d supply, fill #0

## 2022-01-24 NOTE — Telephone Encounter (Signed)
SW pt's sister. She said when she called she got her questions answered about the date and time of the appt and surgery.

## 2022-01-24 NOTE — Telephone Encounter (Signed)
Patient sister/caretaker has a few question prior to preop 01/12 please call at  [facility])  [phone]) [phone] [facility]

## 2022-01-31 ENCOUNTER — Telehealth: Payer: Self-pay | Admitting: Orthopedic Surgery

## 2022-01-31 ENCOUNTER — Other Ambulatory Visit (HOSPITAL_COMMUNITY): Payer: Self-pay

## 2022-01-31 NOTE — Telephone Encounter (Signed)
I called and sw pt's sister and she said that the pt had an appt with her PCP yesterday and that they did labs and "her numbers are high" she was not able to tell me what was elevated but tht the PCP was concerned that they did not rec a clearance request for the pt to have her total joint that is sch on 1*19/2024. I advised that I would send this message to surgical scheduling and mark the message urgent as she has a pre op appt tomorrow. Cb#[phone]

## 2022-01-31 NOTE — Telephone Encounter (Signed)
I called and left a message for [facility] regarding Chelsey Lee.  We have a clearance that was sent to [place] from Dr. Karlton Lemon on 08/20/2021.  It states that she is cleared for surgery from a medical standpoint. I will discuss this with her when she returns my call.

## 2022-01-31 NOTE — Pre-Procedure Instructions (Signed)
Surgical Instructions    Your procedure is scheduled on Friday, February 08, 2022 at 7:30 AM.  Report to [facility] Main Entrance "A" at 5:30 A.M., then check in with the Admitting office.  Call this number if you have problems the morning of surgery:  [phone]   If you have any questions prior to your surgery date call [phone]: Open Monday-Friday 8am-4pm  *If you experience any cold or flu symptoms such as cough, fever, chills, shortness of breath, etc. between now and your scheduled surgery, please notify us.*    Remember:  Do not eat after midnight the night before your surgery  You may drink clear liquids until 4:30 AM the morning of your surgery.   Clear liquids allowed are: Water, Non-Citrus Juices (without pulp), Carbonated Beverages, Clear Tea, Black Coffee Only (NO MILK, CREAM OR POWDERED CREAMER of any kind), and Gatorade.    Take these medicines the morning of surgery with A SIP OF WATER:  allopurinol (ZYLOPRIM)  atorvastatin (LIPITOR)  buPROPion (WELLBUTRIN SR)  carvedilol (COREG)  escitalopram (LEXAPRO)  fenofibrate  isosorbide mononitrate (IMDUR)  LINZESS  HYDROcodone-acetaminophen (NORCO) - if needed  As of today, STOP taking any Aspirin (unless otherwise instructed by your surgeon) Aleve, Naproxen, Ibuprofen, Motrin, Advil, Goody's, BC's, all herbal medications, fish oil, and all vitamins.  WHAT DO I DO ABOUT MY DIABETES MEDICATION?  Stop taking Dulaglutide (TRULICITY) 7 days prior to surgery. Your last dose will be on 01/31/22.  Stop taking Semaglutide (OZEMPIC) 7 days prior to surgery. Your last dose will be on 01/31/22.  THE NIGHT BEFORE SURGERY, take 28 units of insulin NPH-regular Human (70-30) insulin.  Do NOT take you insulin NPH-regular Human (70-30) the morning of surgery.     The day of surgery, do not take other diabetes injectables, including Byetta (exenatide), Bydureon (exenatide ER), Victoza (liraglutide), Semaglutide (OZEMPIC) or  Trulicity (dulaglutide).   HOW TO MANAGE YOUR DIABETES BEFORE AND AFTER SURGERY  Why is it important to control my blood sugar before and after surgery? Improving blood sugar levels before and after surgery helps healing and can limit problems. A way of improving blood sugar control is eating a healthy diet by:  Eating less sugar and carbohydrates  Increasing activity/exercise  Talking with your doctor about reaching your blood sugar goals High blood sugars (greater than 180 mg/dL) can raise your risk of infections and slow your recovery, so you will need to focus on controlling your diabetes during the weeks before surgery. Make sure that the doctor who takes care of your diabetes knows about your planned surgery including the date and location.  How do I manage my blood sugar before surgery? Check your blood sugar at least 4 times a day, starting 2 days before surgery, to make sure that the level is not too high or low.  Check your blood sugar the morning of your surgery when you wake up and every 2 hours until you get to the Short Stay unit.  If your blood sugar is less than 70 mg/dL, you will need to treat for low blood sugar: Do not take insulin. Treat a low blood sugar (less than 70 mg/dL) with  cup of clear juice (cranberry or apple), 4 glucose tablets, OR glucose gel. Recheck blood sugar in 15 minutes after treatment (to make sure it is greater than 70 mg/dL). If your blood sugar is not greater than 70 mg/dL on recheck, call [phone] for further instructions. Report your blood sugar to the short  stay nurse when you get to Short Stay.  If you are admitted to the hospital after surgery: Your blood sugar will be checked by the staff and you will probably be given insulin after surgery (instead of oral diabetes medicines) to make sure you have good blood sugar levels. The goal for blood sugar control after surgery is 80-180 mg/dL.                      Do NOT Smoke  (Tobacco/Vaping) for 24 hours prior to your procedure.  If you use a CPAP at night, you may bring your mask/headgear for your overnight stay.   Contacts, glasses, piercing's, hearing aid's, dentures or partials may not be worn into surgery, please bring cases for these belongings.    For patients admitted to the hospital, discharge time will be determined by your treatment team.   Patients discharged the day of surgery will not be allowed to drive home, and someone needs to stay with them for 24 hours.  SURGICAL WAITING ROOM VISITATION Patients having surgery or a procedure may have two support people in the waiting area. Visitors may stay in the waiting area during the procedure and switch out with other visitors if needed. Only 1 support person is allowed in the pre-op area with the patient AFTER the patient is prepped. This person cannot be switched out. Children under the age of 71 must have an adult accompany them who is not the patient. If the patient needs to stay at the hospital during part of their recovery, the visitor guidelines for inpatient rooms apply.  Please refer to the [facility] website for the visitor guidelines for Inpatients (after your surgery is over and you are in a regular room).    Special instructions:   - Preparing For Surgery  Before surgery, you can play an important role. Because skin is not sterile, your skin needs to be as free of germs as possible. You can reduce the number of germs on your skin by washing with CHG (chlorahexidine gluconate) Soap before surgery.  CHG is an antiseptic cleaner which kills germs and bonds with the skin to continue killing germs even after washing.    Oral Hygiene is also important to reduce your risk of infection.  Remember - BRUSH YOUR TEETH THE MORNING OF SURGERY WITH YOUR REGULAR TOOTHPASTE  Please do not use if you have an allergy to CHG or antibacterial soaps. If your skin becomes reddened/irritated stop using  the CHG.  Do not shave (including legs and underarms) for at least 48 hours prior to first CHG shower. It is OK to shave your face.  Please follow these instructions carefully.   Shower the NIGHT BEFORE SURGERY and the MORNING OF SURGERY  If you chose to wash your hair, wash your hair first as usual with your normal shampoo.  After you shampoo, rinse your hair and body thoroughly to remove the shampoo.  Use CHG Soap as you would any other liquid soap. You can apply CHG directly to the skin and wash gently with a scrungie or a clean washcloth.   Apply the CHG Soap to your body ONLY FROM THE NECK DOWN.  Do not use on open wounds or open sores. Avoid contact with your eyes, ears, mouth and genitals (private parts). Wash Face and genitals (private parts)  with your normal soap.   Wash thoroughly, paying special attention to the area where your surgery will be performed.  Thoroughly rinse  your body with warm water from the neck down.  DO NOT shower/wash with your normal soap after using and rinsing off the CHG Soap.  Pat yourself dry with a CLEAN TOWEL.  Wear CLEAN PAJAMAS to bed the night before surgery  Place CLEAN SHEETS on your bed the night before your surgery  DO NOT SLEEP WITH PETS.   Day of Surgery: Take a shower with CHG soap. Do not wear jewelry or makeup Do not wear lotions, powders, perfumes/colognes, or deodorant. Do not shave 48 hours prior to surgery. Do not wear nail polish, gel polish, artificial nails, or any other type of covering on natural nails (fingers and toes) If you have artificial nails or gel coating that need to be removed by a nail salon, please have this removed prior to surgery. Artificial nails or gel coating may interfere with anesthesia's ability to adequately monitor your vital signs. Wear Clean/Comfortable clothing the morning of surgery Do not bring valuables to the hospital.  [facility] is not responsible for any belongings or  valuables. Remember to brush your teeth WITH YOUR REGULAR TOOTHPASTE.   Please read over the following fact sheets that you were given.  If you received a COVID test during your pre-op visit  it is requested that you wear a mask when out in public, stay away from anyone that may not be feeling well and notify your surgeon if you develop symptoms. If you have been in contact with anyone that has tested positive in the last 10 days please notify you surgeon.

## 2022-01-31 NOTE — Telephone Encounter (Signed)
Patients sister is her care giver and is very upset and concerned about her A1C is so very high and she has not been cleared by her PCP. She would like for someone please call and answer her question--([name] James)--[phone]

## 2022-02-01 ENCOUNTER — Telehealth: Payer: Self-pay | Admitting: Orthopedic Surgery

## 2022-02-01 ENCOUNTER — Encounter (HOSPITAL_COMMUNITY)
Admission: RE | Admit: 2022-02-01 | Discharge: 2022-02-01 | Disposition: A | Discharge status: Home / Self Care | Payer: Medicare Other | Source: Ambulatory Visit | Attending: Orthopedic Surgery | Admitting: Orthopedic Surgery

## 2022-02-01 ENCOUNTER — Other Ambulatory Visit: Payer: Self-pay

## 2022-02-01 ENCOUNTER — Encounter (HOSPITAL_COMMUNITY): Payer: Self-pay

## 2022-02-01 VITALS — BP 125/52 | HR 69 | Temp 98.1°F | Resp 18 | Ht 66.0 in | Wt 268.0 lb

## 2022-02-01 DIAGNOSIS — Z01818 Encounter for other preprocedural examination: Secondary | ICD-10-CM | POA: Insufficient documentation

## 2022-02-01 DIAGNOSIS — I10 Essential (primary) hypertension: Secondary | ICD-10-CM | POA: Insufficient documentation

## 2022-02-01 DIAGNOSIS — E119 Type 2 diabetes mellitus without complications: Secondary | ICD-10-CM | POA: Insufficient documentation

## 2022-02-01 LAB — GLUCOSE, CAPILLARY: Glucose-Capillary: 121 mg/dL — ABNORMAL HIGH (ref 70–99)

## 2022-02-01 LAB — CBC
HCT: 35.3 % — ABNORMAL LOW (ref 36.0–46.0)
Hemoglobin: 11.1 g/dL — ABNORMAL LOW (ref 12.0–15.0)
MCH: 31.4 pg (ref 26.0–34.0)
MCHC: 31.4 g/dL (ref 30.0–36.0)
MCV: 99.7 fL (ref 80.0–100.0)
Platelets: 221 10*3/uL (ref 150–400)
RBC: 3.54 MIL/uL — ABNORMAL LOW (ref 3.87–5.11)
RDW: 13.4 % (ref 11.5–15.5)
WBC: 6.1 10*3/uL (ref 4.0–10.5)
nRBC: 0 % (ref 0.0–0.2)

## 2022-02-01 LAB — SURGICAL PCR SCREEN
MRSA, PCR: NEGATIVE
Staphylococcus aureus: NEGATIVE

## 2022-02-01 LAB — BASIC METABOLIC PANEL
Anion gap: 12 (ref 5–15)
BUN: 31 mg/dL — ABNORMAL HIGH (ref 8–23)
CO2: 31 mmol/L (ref 22–32)
Calcium: 9.9 mg/dL (ref 8.9–10.3)
Chloride: 95 mmol/L — ABNORMAL LOW (ref 98–111)
Creatinine, Ser: 5.57 mg/dL — ABNORMAL HIGH (ref 0.44–1.00)
GFR, Estimated: 8 mL/min — ABNORMAL LOW (ref >60)
Glucose, Bld: 118 mg/dL — ABNORMAL HIGH (ref 70–99)
Potassium: 3.9 mmol/L (ref 3.5–5.1)
Sodium: 138 mmol/L (ref 135–145)

## 2022-02-01 NOTE — Telephone Encounter (Signed)
Patient's sister called. Would like Autumn to call her. [phone]

## 2022-02-01 NOTE — Progress Notes (Signed)
PCP - Dr. Willey Blade Cardiologist - Denies Nephologist: Dr. Lawson Radar  PPM/ICD - Denies  Chest x-ray - N/A EKG - 02/01/22 Stress Test - Denies ECHO - 03/24/14 Cardiac Cath - Years ago  Sleep Study - OSA CPAP - Yes  Fasting Blood Sugar - 130 Checks Blood Sugar __2___ times a day  Last dose of GLP1 agonist-  01/30/22 GLP1 instructions: Stop 7 days prior to surgery. Your last dose will be on 01/31/22  Blood Thinner Instructions: N/A Aspirin Instructions: N/A  ERAS Protcol - Yes PRE-SURGERY Ensure or G2- No  COVID TEST- N/A   Anesthesia review: Yes, dialysis  Patient denies shortness of breath, fever, cough and chest pain at PAT appointment   All instructions explained to the patient, with a verbal understanding of the material. Patient agrees to go over the instructions while at home for a better understanding. Patient also instructed to self quarantine after being tested for COVID-19. The opportunity to ask questions was provided.

## 2022-02-04 ENCOUNTER — Telehealth: Payer: Self-pay | Admitting: Orthopedic Surgery

## 2022-02-04 ENCOUNTER — Encounter (HOSPITAL_COMMUNITY): Payer: Self-pay | Admitting: Orthopedic Surgery

## 2022-02-04 NOTE — Telephone Encounter (Signed)
I see that you called and lm on vm for the pt's sister about medical clearance from her PCP. Pt' sister left message return call. Sch for a totla knee replacement

## 2022-02-04 NOTE — Telephone Encounter (Signed)
Patient's sister Starleen Arms called asked for a call back concerning surgery clearance for the patient. Valerie Salts advised she just want to know what is needed to clear patient for surgery? The number to contact Valerie Salts is [phone]

## 2022-02-05 NOTE — Telephone Encounter (Signed)
Chelsey Lee stopped by the office today to discuss [place] surgery.  I advised and showed [name] the clearance received by Dr. Roland Earl office on 08/14/21.  She asked that I send that back to Dr. Roland Earl office because they did not have record of clearing her and stated no one sent a request.

## 2022-02-07 ENCOUNTER — Encounter (HOSPITAL_COMMUNITY): Payer: Self-pay | Admitting: Orthopedic Surgery

## 2022-02-07 NOTE — Anesthesia Preprocedure Evaluation (Addendum)
Anesthesia Evaluation  Patient identified by MRN, date of birth, ID band Patient awake    Reviewed: Allergy & Precautions, NPO status , Patient's Chart, lab work & pertinent test results, reviewed documented beta blocker date and time   Airway Mallampati: I  TM Distance: >3 FB Neck ROM: Full    Dental no notable dental hx. (+) Dental Advisory Given   Pulmonary sleep apnea and Continuous Positive Airway Pressure Ventilation    Pulmonary exam normal breath sounds clear to auscultation       Cardiovascular hypertension, Pt. on medications and Pt. on home beta blockers Normal cardiovascular exam Rhythm:Regular Rate:Normal  EKG 02/01/22 NSR, normal   Neuro/Psych  PSYCHIATRIC DISORDERS  Depression    negative neurological ROS     GI/Hepatic Neg liver ROS,GERD  Medicated and Controlled,,  Endo/Other  diabetes, Well Controlled, Type 2, Insulin Dependent, Oral Hypoglycemic Agents  Morbid obesityHyperlipidemia  Renal/GU ESRF and DialysisRenal diseaseDialysis T-Th-Sa  negative genitourinary   Musculoskeletal  (+) Arthritis , Osteoarthritis,  OA left knee   Abdominal   Peds  Hematology  (+) Blood dyscrasia, anemia   Anesthesia Other Findings   Reproductive/Obstetrics                              Anesthesia Physical Anesthesia Plan  ASA: 4  Anesthesia Plan: Spinal   Post-op Pain Management: Minimal or no pain anticipated   Induction: Intravenous  PONV Risk Score and Plan: 3 and Treatment may vary due to age or medical condition, Propofol infusion and Ondansetron  Airway Management Planned: Natural Airway and Simple Face Mask  Additional Equipment: None  Intra-op Plan:   Post-operative Plan:   Informed Consent: I have reviewed the patients History and Physical, chart, labs and discussed the procedure including the risks, benefits and alternatives for the proposed anesthesia with the patient  or authorized representative who has indicated his/her understanding and acceptance.     Dental advisory given  Plan Discussed with: Anesthesiologist and CRNA  Anesthesia Plan Comments:          Anesthesia Quick Evaluation

## 2022-02-08 ENCOUNTER — Encounter (HOSPITAL_COMMUNITY): Payer: Self-pay | Admitting: Orthopedic Surgery

## 2022-02-08 ENCOUNTER — Ambulatory Visit (HOSPITAL_COMMUNITY): Payer: Medicare Other | Admitting: Physician Assistant

## 2022-02-08 ENCOUNTER — Other Ambulatory Visit: Payer: Self-pay

## 2022-02-08 ENCOUNTER — Encounter (HOSPITAL_COMMUNITY)
Admission: RE | Disposition: A | Discharge status: Skilled Nursing Facility | Payer: Self-pay | Source: Ambulatory Visit | Attending: Orthopedic Surgery

## 2022-02-08 ENCOUNTER — Inpatient Hospital Stay (HOSPITAL_COMMUNITY)
Admission: RE | Admit: 2022-02-08 | Discharge: 2022-02-13 | DRG: 469 | Disposition: A | Discharge status: Skilled Nursing Facility | Payer: Medicare Other | Source: Ambulatory Visit | Attending: Orthopedic Surgery | Admitting: Orthopedic Surgery

## 2022-02-08 DIAGNOSIS — E119 Type 2 diabetes mellitus without complications: Secondary | ICD-10-CM | POA: Diagnosis present

## 2022-02-08 DIAGNOSIS — N186 End stage renal disease: Secondary | ICD-10-CM | POA: Diagnosis not present

## 2022-02-08 DIAGNOSIS — Z992 Dependence on renal dialysis: Secondary | ICD-10-CM

## 2022-02-08 DIAGNOSIS — Z794 Long term (current) use of insulin: Secondary | ICD-10-CM

## 2022-02-08 DIAGNOSIS — M109 Gout, unspecified: Secondary | ICD-10-CM | POA: Diagnosis present

## 2022-02-08 DIAGNOSIS — M179 Osteoarthritis of knee, unspecified: Secondary | ICD-10-CM | POA: Diagnosis present

## 2022-02-08 DIAGNOSIS — Z7951 Long term (current) use of inhaled steroids: Secondary | ICD-10-CM

## 2022-02-08 DIAGNOSIS — E1122 Type 2 diabetes mellitus with diabetic chronic kidney disease: Secondary | ICD-10-CM

## 2022-02-08 DIAGNOSIS — Z96652 Presence of left artificial knee joint: Principal | ICD-10-CM

## 2022-02-08 DIAGNOSIS — G4733 Obstructive sleep apnea (adult) (pediatric): Secondary | ICD-10-CM | POA: Diagnosis present

## 2022-02-08 DIAGNOSIS — N2581 Secondary hyperparathyroidism of renal origin: Secondary | ICD-10-CM | POA: Diagnosis present

## 2022-02-08 DIAGNOSIS — I120 Hypertensive chronic kidney disease with stage 5 chronic kidney disease or end stage renal disease: Secondary | ICD-10-CM | POA: Diagnosis not present

## 2022-02-08 DIAGNOSIS — Z7984 Long term (current) use of oral hypoglycemic drugs: Secondary | ICD-10-CM

## 2022-02-08 DIAGNOSIS — D631 Anemia in chronic kidney disease: Secondary | ICD-10-CM

## 2022-02-08 DIAGNOSIS — Z6841 Body Mass Index (BMI) 40.0 and over, adult: Secondary | ICD-10-CM

## 2022-02-08 DIAGNOSIS — M1712 Unilateral primary osteoarthritis, left knee: Secondary | ICD-10-CM | POA: Diagnosis not present

## 2022-02-08 DIAGNOSIS — J309 Allergic rhinitis, unspecified: Secondary | ICD-10-CM | POA: Diagnosis present

## 2022-02-08 DIAGNOSIS — E785 Hyperlipidemia, unspecified: Secondary | ICD-10-CM | POA: Diagnosis present

## 2022-02-08 DIAGNOSIS — E559 Vitamin D deficiency, unspecified: Secondary | ICD-10-CM | POA: Diagnosis present

## 2022-02-08 DIAGNOSIS — K219 Gastro-esophageal reflux disease without esophagitis: Secondary | ICD-10-CM | POA: Diagnosis present

## 2022-02-08 DIAGNOSIS — Z79899 Other long term (current) drug therapy: Secondary | ICD-10-CM

## 2022-02-08 HISTORY — PX: TOTAL KNEE ARTHROPLASTY: SHX125

## 2022-02-08 LAB — HEPATITIS B SURFACE ANTIGEN
Hepatitis B Surface Ag: NONREACTIVE
Hepatitis B Surface Ag: NONREACTIVE

## 2022-02-08 LAB — POCT I-STAT, CHEM 8
BUN: 36 mg/dL — ABNORMAL HIGH (ref 8–23)
Calcium, Ion: 1.18 mmol/L (ref 1.15–1.40)
Chloride: 96 mmol/L — ABNORMAL LOW (ref 98–111)
Creatinine, Ser: 5.5 mg/dL — ABNORMAL HIGH (ref 0.44–1.00)
Glucose, Bld: 68 mg/dL — ABNORMAL LOW (ref 70–99)
HCT: 31 % — ABNORMAL LOW (ref 36.0–46.0)
Hemoglobin: 10.5 g/dL — ABNORMAL LOW (ref 12.0–15.0)
Potassium: 3.9 mmol/L (ref 3.5–5.1)
Sodium: 141 mmol/L (ref 135–145)
TCO2: 31 mmol/L (ref 22–32)

## 2022-02-08 LAB — GLUCOSE, CAPILLARY
Glucose-Capillary: 74 mg/dL (ref 70–99)
Glucose-Capillary: 108 mg/dL — ABNORMAL HIGH (ref 70–99)
Glucose-Capillary: 66 mg/dL — ABNORMAL LOW (ref 70–99)
Glucose-Capillary: 95 mg/dL (ref 70–99)
Glucose-Capillary: 137 mg/dL — ABNORMAL HIGH (ref 70–99)
Glucose-Capillary: 165 mg/dL — ABNORMAL HIGH (ref 70–99)

## 2022-02-08 LAB — HEMOGLOBIN A1C
Hgb A1c MFr Bld: 7.3 % — ABNORMAL HIGH (ref 4.8–5.6)
Mean Plasma Glucose: 162.81 mg/dL

## 2022-02-08 LAB — HEPATITIS B CORE ANTIBODY, IGM: Hep B C IgM: NONREACTIVE

## 2022-02-08 SURGERY — ARTHROPLASTY, KNEE, TOTAL
Anesthesia: Spinal | Site: Knee | Laterality: Left

## 2022-02-08 MED ORDER — FENOFIBRATE 160 MG PO TABS
160.0000 mg | ORAL_TABLET | Freq: Every day | ORAL | Status: DC
  Administered 2022-02-09 – 2022-02-13 (×5): 160 mg via ORAL
  Filled 2022-02-08 (×5): qty 1

## 2022-02-08 MED ORDER — MENTHOL 3 MG MT LOZG: 1.0000 | LOZENGE | OROMUCOSAL | Status: DC | PRN

## 2022-02-08 MED ORDER — SODIUM CHLORIDE 0.9 % IV SOLN: INTRAVENOUS | Status: DC

## 2022-02-08 MED ORDER — 0.9 % SODIUM CHLORIDE (POUR BTL) OPTIME
TOPICAL | Status: DC | PRN
  Administered 2022-02-08: 1000 mL

## 2022-02-08 MED ORDER — MIDAZOLAM HCL 2 MG/2ML IJ SOLN
INTRAMUSCULAR | Status: AC
  Filled 2022-02-08: qty 2

## 2022-02-08 MED ORDER — FERRIC CITRATE 1 GM 210 MG(FE) PO TABS
420.0000 mg | ORAL_TABLET | Freq: Three times a day (TID) | ORAL | Status: DC
  Administered 2022-02-08 – 2022-02-13 (×13): 420 mg via ORAL
  Filled 2022-02-08 (×14): qty 2

## 2022-02-08 MED ORDER — METOCLOPRAMIDE HCL 5 MG PO TABS: 5.0000 mg | ORAL_TABLET | Freq: Three times a day (TID) | ORAL | Status: DC | PRN

## 2022-02-08 MED ORDER — MIDAZOLAM HCL 2 MG/2ML IJ SOLN
INTRAMUSCULAR | Status: DC | PRN
  Administered 2022-02-08 (×2): 1 mg via INTRAVENOUS

## 2022-02-08 MED ORDER — METOCLOPRAMIDE HCL 5 MG/ML IJ SOLN: 5.0000 mg | Freq: Three times a day (TID) | INTRAMUSCULAR | Status: DC | PRN

## 2022-02-08 MED ORDER — METHOCARBAMOL 1000 MG/10ML IJ SOLN: 500.0000 mg | Freq: Four times a day (QID) | INTRAVENOUS | Status: DC | PRN

## 2022-02-08 MED ORDER — FENTANYL CITRATE (PF) 250 MCG/5ML IJ SOLN
INTRAMUSCULAR | Status: AC
  Filled 2022-02-08: qty 5

## 2022-02-08 MED ORDER — OXYCODONE HCL 5 MG PO TABS
5.0000 mg | ORAL_TABLET | ORAL | Status: DC | PRN
  Administered 2022-02-08 – 2022-02-13 (×4): 5 mg via ORAL
  Filled 2022-02-08 (×4): qty 1

## 2022-02-08 MED ORDER — ONDANSETRON HCL 4 MG/2ML IJ SOLN: 4.0000 mg | Freq: Once | INTRAMUSCULAR | Status: DC | PRN

## 2022-02-08 MED ORDER — LIDOCAINE 2% (20 MG/ML) 5 ML SYRINGE
INTRAMUSCULAR | Status: AC
  Filled 2022-02-08: qty 5

## 2022-02-08 MED ORDER — DEXTROSE 50 % IV SOLN: 12.5000 g | INTRAVENOUS | Status: AC

## 2022-02-08 MED ORDER — FENTANYL CITRATE (PF) 250 MCG/5ML IJ SOLN
INTRAMUSCULAR | Status: DC | PRN
  Administered 2022-02-08: 50 ug via INTRAVENOUS

## 2022-02-08 MED ORDER — PHENOL 1.4 % MT LIQD: 1.0000 | OROMUCOSAL | Status: DC | PRN

## 2022-02-08 MED ORDER — TRANEXAMIC ACID 1000 MG/10ML IV SOLN
2000.0000 mg | Freq: Once | INTRAVENOUS | Status: AC
  Administered 2022-02-08: 2000 mg via TOPICAL
  Filled 2022-02-08: qty 20

## 2022-02-08 MED ORDER — PROPOFOL 500 MG/50ML IV EMUL
INTRAVENOUS | Status: DC | PRN
  Administered 2022-02-08: 25 ug/kg/min via INTRAVENOUS

## 2022-02-08 MED ORDER — ZOLPIDEM TARTRATE 5 MG PO TABS
5.0000 mg | ORAL_TABLET | Freq: Every evening | ORAL | Status: DC | PRN
  Administered 2022-02-09: 5 mg via ORAL
  Filled 2022-02-08: qty 1

## 2022-02-08 MED ORDER — INSULIN ASPART 100 UNIT/ML IJ SOLN
4.0000 [IU] | Freq: Three times a day (TID) | INTRAMUSCULAR | Status: DC
  Administered 2022-02-09 – 2022-02-13 (×12): 4 [IU] via SUBCUTANEOUS

## 2022-02-08 MED ORDER — CHLORHEXIDINE GLUCONATE 0.12 % MT SOLN
15.0000 mL | Freq: Once | OROMUCOSAL | Status: AC
  Administered 2022-02-08: 15 mL via OROMUCOSAL
  Filled 2022-02-08: qty 15

## 2022-02-08 MED ORDER — BUPIVACAINE IN DEXTROSE 0.75-8.25 % IT SOLN
INTRATHECAL | Status: DC | PRN
  Administered 2022-02-08: 1.7 mL via INTRATHECAL

## 2022-02-08 MED ORDER — ATORVASTATIN CALCIUM 10 MG PO TABS
20.0000 mg | ORAL_TABLET | Freq: Every day | ORAL | Status: DC
  Administered 2022-02-09 – 2022-02-13 (×5): 20 mg via ORAL
  Filled 2022-02-08 (×5): qty 2

## 2022-02-08 MED ORDER — OXYCODONE HCL 5 MG/5ML PO SOLN: 5.0000 mg | Freq: Once | ORAL | Status: DC | PRN

## 2022-02-08 MED ORDER — FERRIC CITRATE 1 GM 210 MG(FE) PO TABS: 210.0000 mg | ORAL_TABLET | ORAL | Status: DC

## 2022-02-08 MED ORDER — DEXTROSE 50 % IV SOLN
INTRAVENOUS | Status: AC
  Administered 2022-02-08: 12.5 g via INTRAVENOUS
  Filled 2022-02-08: qty 50

## 2022-02-08 MED ORDER — PROPOFOL 10 MG/ML IV BOLUS
INTRAVENOUS | Status: AC
  Filled 2022-02-08: qty 20

## 2022-02-08 MED ORDER — PROPOFOL 10 MG/ML IV BOLUS
INTRAVENOUS | Status: DC | PRN
  Administered 2022-02-08 (×2): 10 mg via INTRAVENOUS

## 2022-02-08 MED ORDER — METHOCARBAMOL 500 MG PO TABS
500.0000 mg | ORAL_TABLET | Freq: Four times a day (QID) | ORAL | Status: DC | PRN
  Administered 2022-02-08 – 2022-02-13 (×6): 500 mg via ORAL
  Filled 2022-02-08 (×6): qty 1

## 2022-02-08 MED ORDER — LACTATED RINGERS IV SOLN: INTRAVENOUS | Status: DC

## 2022-02-08 MED ORDER — DEXAMETHASONE SODIUM PHOSPHATE 10 MG/ML IJ SOLN
INTRAMUSCULAR | Status: AC
  Filled 2022-02-08: qty 1

## 2022-02-08 MED ORDER — CEFAZOLIN IN SODIUM CHLORIDE 3-0.9 GM/100ML-% IV SOLN
3.0000 g | INTRAVENOUS | Status: AC
  Administered 2022-02-08: 3 g via INTRAVENOUS
  Filled 2022-02-08: qty 100

## 2022-02-08 MED ORDER — BUPROPION HCL ER (SR) 150 MG PO TB12
150.0000 mg | ORAL_TABLET | Freq: Two times a day (BID) | ORAL | Status: DC
  Administered 2022-02-08 – 2022-02-13 (×9): 150 mg via ORAL
  Filled 2022-02-08 (×9): qty 1

## 2022-02-08 MED ORDER — ISOSORBIDE MONONITRATE ER 30 MG PO TB24
30.0000 mg | ORAL_TABLET | Freq: Every day | ORAL | Status: DC
  Administered 2022-02-09 – 2022-02-13 (×5): 30 mg via ORAL
  Filled 2022-02-08 (×5): qty 1

## 2022-02-08 MED ORDER — SODIUM CHLORIDE 0.9 % IR SOLN
Status: DC | PRN
  Administered 2022-02-08: 3000 mL

## 2022-02-08 MED ORDER — POVIDONE-IODINE 10 % EX SWAB
2.0000 | Freq: Once | CUTANEOUS | Status: AC
  Administered 2022-02-08: 2 via TOPICAL

## 2022-02-08 MED ORDER — DEXTROSE 50 % IV SOLN
12.5000 mL | Freq: Once | INTRAVENOUS | Status: AC
  Administered 2022-02-08: 12.5 mL via INTRAVENOUS

## 2022-02-08 MED ORDER — INSULIN ASPART 100 UNIT/ML IJ SOLN
0.0000 [IU] | Freq: Three times a day (TID) | INTRAMUSCULAR | Status: DC
  Administered 2022-02-09: 3 [IU] via SUBCUTANEOUS
  Administered 2022-02-09: 5 [IU] via SUBCUTANEOUS
  Administered 2022-02-10 – 2022-02-13 (×8): 3 [IU] via SUBCUTANEOUS
  Administered 2022-02-13: 2 [IU] via SUBCUTANEOUS

## 2022-02-08 MED ORDER — ESCITALOPRAM OXALATE 10 MG PO TABS
10.0000 mg | ORAL_TABLET | Freq: Every day | ORAL | Status: DC
  Administered 2022-02-09 – 2022-02-13 (×5): 10 mg via ORAL
  Filled 2022-02-08 (×5): qty 1

## 2022-02-08 MED ORDER — SEVELAMER CARBONATE 800 MG PO TABS
1600.0000 mg | ORAL_TABLET | Freq: Three times a day (TID) | ORAL | Status: DC
  Administered 2022-02-09 – 2022-02-13 (×12): 1600 mg via ORAL
  Filled 2022-02-08 (×13): qty 2

## 2022-02-08 MED ORDER — HYDROMORPHONE HCL 1 MG/ML IJ SOLN: 0.2500 mg | INTRAMUSCULAR | Status: DC | PRN

## 2022-02-08 MED ORDER — ACETAMINOPHEN 10 MG/ML IV SOLN
INTRAVENOUS | Status: AC
  Filled 2022-02-08: qty 100

## 2022-02-08 MED ORDER — CHLORHEXIDINE GLUCONATE CLOTH 2 % EX PADS
6.0000 | MEDICATED_PAD | Freq: Every day | CUTANEOUS | Status: DC
  Administered 2022-02-09 – 2022-02-13 (×4): 6 via TOPICAL

## 2022-02-08 MED ORDER — OXYCODONE HCL 5 MG PO TABS: 5.0000 mg | ORAL_TABLET | Freq: Once | ORAL | Status: DC | PRN

## 2022-02-08 MED ORDER — ONDANSETRON HCL 4 MG PO TABS: 4.0000 mg | ORAL_TABLET | Freq: Four times a day (QID) | ORAL | Status: DC | PRN

## 2022-02-08 MED ORDER — DOCUSATE SODIUM 100 MG PO CAPS
100.0000 mg | ORAL_CAPSULE | Freq: Two times a day (BID) | ORAL | Status: DC
  Administered 2022-02-09 – 2022-02-13 (×7): 100 mg via ORAL
  Filled 2022-02-08 (×9): qty 1

## 2022-02-08 MED ORDER — ORAL CARE MOUTH RINSE: 15.0000 mL | Freq: Once | OROMUCOSAL | Status: AC

## 2022-02-08 MED ORDER — HYDROMORPHONE HCL 1 MG/ML IJ SOLN
0.5000 mg | INTRAMUSCULAR | Status: DC | PRN
  Administered 2022-02-08 – 2022-02-10 (×7): 1 mg via INTRAVENOUS
  Filled 2022-02-08 (×8): qty 1

## 2022-02-08 MED ORDER — TRANEXAMIC ACID-NACL 1000-0.7 MG/100ML-% IV SOLN
1000.0000 mg | INTRAVENOUS | Status: AC
  Administered 2022-02-08: 1000 mg via INTRAVENOUS
  Filled 2022-02-08: qty 100

## 2022-02-08 MED ORDER — ONDANSETRON HCL 4 MG/2ML IJ SOLN
INTRAMUSCULAR | Status: AC
  Filled 2022-02-08: qty 2

## 2022-02-08 MED ORDER — DEXTROSE 50 % IV SOLN
INTRAVENOUS | Status: AC
  Filled 2022-02-08: qty 50

## 2022-02-08 MED ORDER — ALLOPURINOL 100 MG PO TABS: 100.0000 mg | ORAL_TABLET | Freq: Every day | ORAL | Status: DC

## 2022-02-08 MED ORDER — ALLOPURINOL 100 MG PO TABS
100.0000 mg | ORAL_TABLET | Freq: Every day | ORAL | Status: DC
  Administered 2022-02-09 – 2022-02-13 (×5): 100 mg via ORAL
  Filled 2022-02-08 (×4): qty 1

## 2022-02-08 MED ORDER — FUROSEMIDE 40 MG PO TABS
80.0000 mg | ORAL_TABLET | ORAL | Status: DC
  Administered 2022-02-10 – 2022-02-13 (×3): 80 mg via ORAL
  Filled 2022-02-08 (×3): qty 2

## 2022-02-08 MED ORDER — ONDANSETRON HCL 4 MG/2ML IJ SOLN
INTRAMUSCULAR | Status: DC | PRN
  Administered 2022-02-08: 4 mg via INTRAVENOUS

## 2022-02-08 MED ORDER — ASPIRIN 325 MG PO TBEC
325.0000 mg | DELAYED_RELEASE_TABLET | Freq: Every day | ORAL | Status: DC
  Administered 2022-02-09 – 2022-02-13 (×5): 325 mg via ORAL
  Filled 2022-02-08 (×5): qty 1

## 2022-02-08 MED ORDER — BUPIVACAINE HCL (PF) 0.5 % IJ SOLN
INTRAMUSCULAR | Status: DC | PRN
  Administered 2022-02-08: 20 mL via PERINEURAL

## 2022-02-08 MED ORDER — MAGNESIUM CITRATE PO SOLN: 1.0000 | Freq: Once | ORAL | Status: DC | PRN

## 2022-02-08 MED ORDER — HYDROMORPHONE HCL 1 MG/ML IJ SOLN
0.5000 mg | INTRAMUSCULAR | Status: DC | PRN
  Administered 2022-02-08: 1 mg via INTRAVENOUS
  Filled 2022-02-08: qty 1

## 2022-02-08 MED ORDER — CEFAZOLIN SODIUM-DEXTROSE 2-4 GM/100ML-% IV SOLN: 2.0000 g | INTRAVENOUS | Status: DC

## 2022-02-08 MED ORDER — LINACLOTIDE 145 MCG PO CAPS
145.0000 ug | ORAL_CAPSULE | Freq: Every day | ORAL | Status: DC
  Administered 2022-02-09 – 2022-02-11 (×3): 145 ug via ORAL
  Filled 2022-02-08 (×5): qty 1

## 2022-02-08 MED ORDER — CEFAZOLIN SODIUM-DEXTROSE 1-4 GM/50ML-% IV SOLN
1.0000 g | INTRAVENOUS | Status: AC
  Administered 2022-02-09: 1 g via INTRAVENOUS
  Filled 2022-02-08: qty 50

## 2022-02-08 MED ORDER — ONDANSETRON HCL 4 MG/2ML IJ SOLN: 4.0000 mg | Freq: Four times a day (QID) | INTRAMUSCULAR | Status: DC | PRN

## 2022-02-08 MED ORDER — OXYCODONE HCL 5 MG PO TABS
10.0000 mg | ORAL_TABLET | ORAL | Status: DC | PRN
  Administered 2022-02-08: 10 mg via ORAL
  Administered 2022-02-09 – 2022-02-10 (×3): 15 mg via ORAL
  Administered 2022-02-11: 10 mg via ORAL
  Administered 2022-02-11: 15 mg via ORAL
  Filled 2022-02-08 (×2): qty 3
  Filled 2022-02-08 (×2): qty 2
  Filled 2022-02-08 (×2): qty 3

## 2022-02-08 MED ORDER — FERRIC CITRATE 1 GM 210 MG(FE) PO TABS
210.0000 mg | ORAL_TABLET | Freq: Two times a day (BID) | ORAL | Status: DC | PRN
  Filled 2022-02-08: qty 1

## 2022-02-08 MED ORDER — ACETAMINOPHEN 325 MG PO TABS: 325.0000 mg | ORAL_TABLET | Freq: Four times a day (QID) | ORAL | Status: DC | PRN

## 2022-02-08 MED ORDER — BISACODYL 10 MG RE SUPP: 10.0000 mg | Freq: Every day | RECTAL | Status: DC | PRN

## 2022-02-08 MED ORDER — POLYETHYLENE GLYCOL 3350 17 G PO PACK
17.0000 g | PACK | Freq: Every day | ORAL | Status: DC | PRN
  Administered 2022-02-10: 17 g via ORAL
  Filled 2022-02-08: qty 1

## 2022-02-08 MED ORDER — ACETAMINOPHEN 10 MG/ML IV SOLN
INTRAVENOUS | Status: DC | PRN
  Administered 2022-02-08: 1000 mg via INTRAVENOUS

## 2022-02-08 MED ORDER — PHENYLEPHRINE HCL-NACL 20-0.9 MG/250ML-% IV SOLN
INTRAVENOUS | Status: DC | PRN
  Administered 2022-02-08: 20 ug/min via INTRAVENOUS

## 2022-02-08 MED ORDER — BUPIVACAINE LIPOSOME 1.3 % IJ SUSP
INTRAMUSCULAR | Status: DC | PRN
  Administered 2022-02-08: 10 mL via PERINEURAL

## 2022-02-08 MED ORDER — CARVEDILOL 25 MG PO TABS
25.0000 mg | ORAL_TABLET | Freq: Two times a day (BID) | ORAL | Status: DC
  Administered 2022-02-09 – 2022-02-13 (×7): 25 mg via ORAL
  Filled 2022-02-08 (×7): qty 1

## 2022-02-08 SURGICAL SUPPLY — 61 items
BAG COUNTER SPONGE SURGICOUNT (BAG) ×2 IMPLANT
BAG SPNG CNTER NS LX DISP (BAG) ×1
BLADE SAGITTAL 25.0X1.19X90 (BLADE) ×2 IMPLANT
BLADE SAW SGTL 13X75X1.27 (BLADE) ×2 IMPLANT
BLADE SURG 10 STRL SS (BLADE) IMPLANT
BLADE SURG 21 STRL SS (BLADE) ×4 IMPLANT
BNDG CMPR 5X6 CHSV STRCH STRL (GAUZE/BANDAGES/DRESSINGS) ×1
BNDG COHESIVE 6X5 TAN ST LF (GAUZE/BANDAGES/DRESSINGS) IMPLANT
BNDG COHESIVE 6X5 TAN STRL LF (GAUZE/BANDAGES/DRESSINGS) ×4 IMPLANT
BNDG GAUZE DERMACEA FLUFF 4 (GAUZE/BANDAGES/DRESSINGS) ×2 IMPLANT
BNDG GZE DERMACEA 4 6PLY (GAUZE/BANDAGES/DRESSINGS)
BOWL SMART MIX CTS (DISPOSABLE) ×2 IMPLANT
BSPLAT TIB 5D E CMNT STM LT (Knees) ×1 IMPLANT
CEMENT BONE R 1X40 (Cement) ×4 IMPLANT
COMP FEM CMT CR PERS SZ8 LT (Joint) ×1 IMPLANT
COMPONENT FEM CMT CR PRS SZ8LT (Joint) IMPLANT
COOLER ICEMAN CLASSIC (MISCELLANEOUS) ×2 IMPLANT
COVER SURGICAL LIGHT HANDLE (MISCELLANEOUS) ×2 IMPLANT
CUFF TOURN SGL QUICK 34 (TOURNIQUET CUFF) ×1
CUFF TOURN SGL QUICK 42 (TOURNIQUET CUFF) IMPLANT
CUFF TRNQT CYL 34X4.125X (TOURNIQUET CUFF) ×2 IMPLANT
DRAPE EXTREMITY T 121X128X90 (DISPOSABLE) ×2 IMPLANT
DRAPE HALF SHEET 40X57 (DRAPES) ×4 IMPLANT
DRAPE U-SHAPE 47X51 STRL (DRAPES) ×2 IMPLANT
DRSG ADAPTIC 3X8 NADH LF (GAUZE/BANDAGES/DRESSINGS) ×2 IMPLANT
DURAPREP 26ML APPLICATOR (WOUND CARE) ×2 IMPLANT
ELECT REM PT RETURN 9FT ADLT (ELECTROSURGICAL) ×1
ELECTRODE REM PT RTRN 9FT ADLT (ELECTROSURGICAL) ×2 IMPLANT
FACESHIELD WRAPAROUND (MASK) IMPLANT
FACESHIELD WRAPAROUND OR TEAM (MASK) ×2 IMPLANT
GAUZE PAD ABD 8X10 STRL (GAUZE/BANDAGES/DRESSINGS) ×2 IMPLANT
GAUZE SPONGE 4X4 12PLY STRL (GAUZE/BANDAGES/DRESSINGS) ×2 IMPLANT
GLOVE BIOGEL PI IND STRL 9 (GLOVE) ×2 IMPLANT
GLOVE SURG ORTHO 9.0 STRL STRW (GLOVE) ×2 IMPLANT
GOWN STRL REUS W/ TWL XL LVL3 (GOWN DISPOSABLE) ×4 IMPLANT
GOWN STRL REUS W/TWL XL LVL3 (GOWN DISPOSABLE) ×2
HANDPIECE INTERPULSE COAX TIP (DISPOSABLE) ×1
HDLS TROCR DRIL PIN KNEE 75 (PIN) ×1
KIT BASIN OR (CUSTOM PROCEDURE TRAY) ×2 IMPLANT
KIT TURNOVER KIT B (KITS) ×2 IMPLANT
MANIFOLD NEPTUNE II (INSTRUMENTS) ×2 IMPLANT
NS IRRIG 1000ML POUR BTL (IV SOLUTION) ×2 IMPLANT
PACK TOTAL JOINT (CUSTOM PROCEDURE TRAY) ×2 IMPLANT
PAD ARMBOARD 7.5X6 YLW CONV (MISCELLANEOUS) ×2 IMPLANT
PAD COLD SHLDR WRAP-ON (PAD) ×2 IMPLANT
PIN DRILL HDLS TROCAR 75 4PK (PIN) IMPLANT
SCREW FEMALE HEX FIX 25X2.5 (ORTHOPEDIC DISPOSABLE SUPPLIES) IMPLANT
SET HNDPC FAN SPRY TIP SCT (DISPOSABLE) ×2 IMPLANT
STAPLER VISISTAT 35W (STAPLE) ×2 IMPLANT
STEM POLY PAT PLY 32M KNEE (Knees) IMPLANT
STEM TIBIA 5 DEG SZ E L KNEE (Knees) IMPLANT
STEM TIBIAL 10 8-11 EF POLY LT (Joint) IMPLANT
SUCTION FRAZIER HANDLE 10FR (MISCELLANEOUS)
SUCTION TUBE FRAZIER 10FR DISP (MISCELLANEOUS) IMPLANT
SUT VIC AB 0 CT1 27 (SUTURE) ×2
SUT VIC AB 0 CT1 27XBRD ANBCTR (SUTURE) ×2 IMPLANT
SUT VIC AB 1 CTX 36 (SUTURE) ×1
SUT VIC AB 1 CTX36XBRD ANBCTR (SUTURE) IMPLANT
TIBIA STEM 5 DEG SZ E L KNEE (Knees) ×1 IMPLANT
TOWEL GREEN STERILE (TOWEL DISPOSABLE) ×2 IMPLANT
TOWEL GREEN STERILE FF (TOWEL DISPOSABLE) ×2 IMPLANT

## 2022-02-08 NOTE — Consult Note (Signed)
[facility] Renal Consultation Note    Indication for Consultation:  Management of ESRD/hemodialysis; anemia, hypertension/volume and secondary hyperparathyroidism  ZOX:WRUEAVW, [name], MD  HPI: Chelsey Lee is a 67 y.o. female with ESRD on HD TTS at [facility].  Past medical history significant for OSA, HTN, CVD, Allergic Rhinitis, Celiac Disease, GERD, DM2, Goiter, Epidural Lipomatosis,Gout and Osteoarthritis.  Patient presented to the hospital today for elective total left knee arthroplasty.  Seen and examined at bedside post surgery.  Reports tolerating procedure well and starting to feel some pain.  Otherwise she is doing well.  Dialysis has been going well and she has not missed a treatment.  Denies CP, SOB, dizziness, weakness, fever, chills, abdominal pain and n/v/d.  Patient admitted for observation post surgery.   Past Medical History:  Diagnosis Date   Arthritis    Diabetes mellitus    ESRD on HD    T Th Sa   GERD (gastroesophageal reflux disease)    Hyperlipemia    Hypertension    Seasonal allergies    Sleep apnea    Past Surgical History:  Procedure Laterality Date   AV FISTULA PLACEMENT Left 04/10/2020   Procedure: LEFT ARM ARTERIOVENOUS (AV) FISTULA CREATION;  Surgeon: Elam Dutch, MD;  Location: [place];  Service: Vascular;  Laterality: Left;   CAPSULOTOMY  01/13/2012   Procedure: MINOR CAPSULOTOMY;  Surgeon: Myrtha Mantis., MD;  Location: [place];  Service: Ophthalmology;  Laterality: Left;   DORSAL COMPARTMENT RELEASE  01/22/2007   rt    FISTULA SUPERFICIALIZATION Left 05/29/2020   Procedure: LEFT UPPER ARM FISTULA SUPERFICIALIZATION WITH LIGATION OF MULTIPLE SIDE BRANCHES;  Surgeon: Elam Dutch, MD;  Location: [place];  Service: Vascular;  Laterality: Left;   KNEE ARTHROSCOPY  02/21/2002   right and left   STERIOD INJECTION  08/01/2011   Procedure: STEROID INJECTION;  Surgeon: Cammie Sickle., MD;  Location: [place];  Service: Orthopedics;  Laterality: Left;  [facility] left thumb   TONSILLECTOMY     TRIGGER FINGER RELEASE  01/22/2004   rt thumb   TRIGGER FINGER RELEASE  08/01/2011   Procedure: RELEASE TRIGGER FINGER/A-1 PULLEY;  Surgeon: Cammie Sickle., MD;  Location: [place];  Service: Orthopedics;  Laterality: Left;  left thumb   YAG LASER APPLICATION  09/81/1914   Procedure: YAG LASER APPLICATION;  Surgeon: Myrtha Mantis., MD;  Location: [place];  Service: Ophthalmology;  Laterality: Left;   History reviewed. No pertinent family history. Social History:  reports that she has never smoked. She has never used smokeless tobacco. She reports that she does not drink alcohol and does not use drugs. No Known Allergies Prior to Admission medications   Medication Sig Start Date End Date Taking? Authorizing Provider  allopurinol (ZYLOPRIM) 100 MG tablet Take 1 tablet (100 mg total) by mouth daily. 10/10/20  Yes Dondra Prader R, NP  ammonium lactate (AMLACTIN) 12 % lotion Apply 1 Application topically as needed for dry skin. 12/05/21  Yes Felipa Furnace, DPM  atorvastatin (LIPITOR) 20 MG tablet Take 20 mg by mouth daily.   Yes [provider]  AURYXIA 1 GM 210 MG(Fe) tablet Take 210-420 mg by mouth See admin instructions. 420 mg three times daily with meals, and 210 mg twice daily with snacks   Yes [provider]  B Complex-C-Folic Acid (DIALYVITE TABLET) TABS Take 1 tablet by mouth daily.   Yes [provider]  buPROPion (WELLBUTRIN SR) 150 MG 12 hr tablet Take 150 mg by mouth 2 (two) times daily. 02/18/20  Yes [provider]  carvedilol (COREG) 25 MG tablet Take 25 mg by mouth 2 (two) times daily with a meal.   Yes [provider]  CHELATED MAGNESIUM PO Take 2 tablets by mouth every evening.   Yes [provider]  Continuous Blood Gluc Sensor (FREESTYLE LIBRE 2 SENSOR) MISC  05/12/20  Yes [provider]   escitalopram (LEXAPRO) 10 MG tablet Take 10 mg by mouth daily.   Yes [provider]  fenofibrate 160 MG tablet Take 160 mg by mouth daily.   Yes [provider]  FREESTYLE LITE test strip SMARTSIG:Via Meter 2-3 Times Daily 03/29/20  Yes [provider]  furosemide (LASIX) 80 MG tablet Take 80 mg by mouth See admin instructions. On Non- Dialysis Days, Sun, Mon, Wed, and Fri   Yes [provider]  glucose blood (ONETOUCH ULTRA) test strip  02/01/19  Yes [provider]  HYDROcodone-acetaminophen (NORCO) 5-325 MG tablet Take 1 tablet by mouth 3 (three) times daily as needed for moderate pain. 12/11/21  Yes Suzan Slick, NP  insulin NPH-regular Human (70-30) 100 UNIT/ML injection Inject 40-50 Units into the skin See admin instructions. Inject 50 units into the skin in the morning and 40 units at night   Yes [provider]  isosorbide mononitrate (IMDUR) 30 MG 24 hr tablet Take 30 mg by mouth daily.   Yes [provider]  LINZESS 145 MCG CAPS capsule Take 145 mcg by mouth daily.   Yes [provider]  Semaglutide,0.25 or 0.'5MG'$ /DOS, (OZEMPIC, 0.25 OR 0.5 MG/DOSE,) 2 MG/3ML SOPN Inject 0.25 mg into the skin once a week. 01/23/22  Yes   sevelamer carbonate (RENVELA) 800 MG tablet Take 1,600 mg by mouth 3 (three) times daily with meals. 07/11/21  Yes [provider]  Dulaglutide (TRULICITY) 1.5 YH/0.6CB SOPN Inject 1.5 mg into the skin once a week.    [provider]  metolazone (ZAROXOLYN) 2.5 MG tablet Take 2.5 mg by mouth once a week.    [provider]   Current Facility-Administered Medications  Medication Dose Route Frequency Provider Last Rate Last Admin   0.9 %  sodium chloride infusion   Intravenous Continuous Newt Minion, MD   Stopped at 02/08/22 1300   [START ON 02/09/2022] acetaminophen (TYLENOL) tablet 325-650 mg  325-650 mg Oral Q6H PRN Newt Minion, MD       [START ON 02/09/2022] allopurinol  (ZYLOPRIM) tablet 100 mg  100 mg Oral Daily Newt Minion, MD       [START ON 02/09/2022] aspirin EC tablet 325 mg  325 mg Oral Q breakfast Newt Minion, MD       [START ON 02/09/2022] atorvastatin (LIPITOR) tablet 20 mg  20 mg Oral Daily Newt Minion, MD       bisacodyl (DULCOLAX) suppository 10 mg  10 mg Rectal Daily PRN Newt Minion, MD       buPROPion [facility] SR) 12 hr tablet 150 mg  150 mg Oral BID Newt Minion, MD       [START ON 02/09/2022] carvedilol (COREG) tablet 25 mg  25 mg Oral BID WC Newt Minion, MD       [START ON 02/09/2022] ceFAZolin (ANCEF) IVPB 1 g/50 mL premix  1 g Intravenous Q24H Wendee Beavers, RPH       docusate sodium (COLACE) capsule  100 mg  100 mg Oral BID Newt Minion, MD       [START ON 02/09/2022] escitalopram (LEXAPRO) tablet 10 mg  10 mg Oral Daily Newt Minion, MD       [START ON 02/09/2022] fenofibrate tablet 160 mg  160 mg Oral Daily Newt Minion, MD       ferric citrate (AURYXIA) tablet 420 mg  420 mg Oral TID WC Newt Minion, MD   420 mg at 02/08/22 1328   And   ferric citrate (AURYXIA) tablet 210 mg  210 mg Oral BID PRN Newt Minion, MD       [START ON 02/10/2022] furosemide (LASIX) tablet 80 mg  80 mg Oral Once per day on Sun Mon Wed Fri Newt Minion, MD       HYDROmorphone (DILAUDID) injection 0.5-1 mg  0.5-1 mg Intravenous Q4H PRN Newt Minion, MD   1 mg at 02/08/22 1420   insulin aspart (novoLOG) injection 0-15 Units  0-15 Units Subcutaneous TID WC Newt Minion, MD       insulin aspart (novoLOG) injection 4 Units  4 Units Subcutaneous TID WC Newt Minion, MD       [START ON 02/09/2022] isosorbide mononitrate (IMDUR) 24 hr tablet 30 mg  30 mg Oral Daily Newt Minion, MD       [START ON 02/09/2022] linaclotide (LINZESS) capsule 145 mcg  145 mcg Oral QAC breakfast Newt Minion, MD       magnesium citrate solution 1 Bottle  1 Bottle Oral Once PRN Newt Minion, MD       menthol-cetylpyridinium (CEPACOL) lozenge 3 mg  1 lozenge  Oral PRN Newt Minion, MD       Or   phenol (CHLORASEPTIC) mouth spray 1 spray  1 spray Mouth/Throat PRN Newt Minion, MD       methocarbamol (ROBAXIN) tablet 500 mg  500 mg Oral Q6H PRN Newt Minion, MD       Or   methocarbamol (ROBAXIN) 500 mg in dextrose 5 % 50 mL IVPB  500 mg Intravenous Q6H PRN Newt Minion, MD       metoCLOPramide (REGLAN) tablet 5-10 mg  5-10 mg Oral Q8H PRN Newt Minion, MD       Or   metoCLOPramide (REGLAN) injection 5-10 mg  5-10 mg Intravenous Q8H PRN Newt Minion, MD       ondansetron [facility]) tablet 4 mg  4 mg Oral Q6H PRN Newt Minion, MD       Or   ondansetron [facility]) injection 4 mg  4 mg Intravenous Q6H PRN Newt Minion, MD       oxyCODONE (Oxy IR/ROXICODONE) immediate release tablet 10-15 mg  10-15 mg Oral Q4H PRN Newt Minion, MD       oxyCODONE (Oxy IR/ROXICODONE) immediate release tablet 5-10 mg  5-10 mg Oral Q4H PRN Newt Minion, MD   5 mg at 02/08/22 1330   polyethylene glycol (MIRALAX / GLYCOLAX) packet 17 g  17 g Oral Daily PRN Newt Minion, MD       sevelamer carbonate (RENVELA) tablet 1,600 mg  1,600 mg Oral TID WC Newt Minion, MD       zolpidem (AMBIEN) tablet 5 mg  5 mg Oral QHS PRN Newt Minion, MD       Labs: Basic Metabolic Panel: Recent Labs  Lab 02/08/22 0620  NA 141  K  3.9  CL 96*  GLUCOSE 68*  BUN 36*  CREATININE 5.50*   CBC: Recent Labs  Lab 02/08/22 0620  HGB 10.5*  HCT 31.0*    CBG: Recent Labs  Lab 02/08/22 0556 02/08/22 0707 02/08/22 0933 02/08/22 0956  GLUCAP 74 108* 66* 95   Studies/Results: No results found.  ROS: All others negative except those listed in HPI.  Physical Exam: Vitals:   02/08/22 1145 02/08/22 1200 02/08/22 1215 02/08/22 1234  BP: (!) 117/46 (!) 119/50 133/60 (!) 119/50  Pulse: 63 64 79 66  Resp: '17 14 17 17  '$ Temp:   97.6 F (36.4 C) (!) 97.5 F (36.4 C)  TempSrc:    Oral  SpO2: 96% 95% 92% 100%  Weight:      Height:         General: WDWN female in  NAD Head: NCAT sclera not icteric MMM Neck: Supple.  Lungs: CTA bilaterally. No wheeze, rales or rhonchi. Breathing is unlabored. Heart: RRR. No murmur, rubs or gallops.  Abdomen: soft, nontender, +BS, no guarding, no rebound tenderness Lower extremities:L leg wrapped, trace edema b/l Neuro: AAOx3. Moves all extremities spontaneously. Psych:  Responds to questions appropriately with a normal affect. Dialysis Access: LU AVF +b/t  Dialysis Orders:   TTS - [place]  4hrs, BFR 350, DFR 600,  EDW 122.6kg, 2K/ 2.5Ca  Access: LU AVF - 16G needles  Heparin 3200 Hectorol 58mg IV qHD   Assessment/Plan:  Osteoarthritis - s/p L TKR today  ESRD -  HD tomorrow per regular schedule  Hypertension/volume  - BP well controlled.   Anemia of CKD - hgb 10.5. Not currently on ESA, follow labs.   Secondary Hyperparathyroidism -  Check Ca and phos.  Continue VDRA and binders _ auryxia 2 AC.   Nutrition - Renal diet w/fluid restrictions.  DMT2 - per primary  LJen Mow PA-C [facility] 02/08/2022, 2:40 PM

## 2022-02-08 NOTE — Anesthesia Postprocedure Evaluation (Signed)
Anesthesia Post Note  Patient: Chelsey Lee  Procedure(s) Performed: LEFT TOTAL KNEE ARTHROPLASTY (Left: Knee)     Patient location during evaluation: PACU Anesthesia Type: Spinal Level of consciousness: oriented and awake and alert Pain management: pain level controlled Vital Signs Assessment: post-procedure vital signs reviewed and stable Respiratory status: spontaneous breathing, respiratory function stable and nonlabored ventilation Cardiovascular status: blood pressure returned to baseline and stable Postop Assessment: no headache, no backache, no apparent nausea or vomiting, spinal receding and patient able to bend at knees Anesthetic complications: no   No notable events documented.  Last Vitals:  Vitals:   02/08/22 0945 02/08/22 1000  BP: (!) 130/56 (!) 130/55  Pulse: 76 76  Resp: 14 10  Temp:    SpO2: 96% 94%    Last Pain:  Vitals:   02/08/22 1000  TempSrc:   PainSc: 0-No pain                 Keilee Denman A.

## 2022-02-08 NOTE — Op Note (Addendum)
DATE OF SURGERY:  02/08/2022  TIME: 9:56 AM  PATIENT NAME:  Chelsey Lee    AGE: 67 y.o.    PRE-OPERATIVE DIAGNOSIS:  Osteoarthritis Left Knee  POST-OPERATIVE DIAGNOSIS:  Osteoarthritis Left Knee  PROCEDURE:  Procedure(s): LEFT TOTAL KNEE ARTHROPLASTY  SURGEON: Meridee Score  ASSISTANT: Franz Dell  OPERATIVE IMPLANTS: Zimmer Persona.  Femur size 8, Tibia size E, Patella size 32 3-peg oval button, with a 10 mm polyethylene insert medial congruent.  '@ENCIMAGES'$ @    PREOPERATIVE INDICATIONS:   Chelsey Lee is a 67 y.o. year old female with end stage degenerative arthritis of the knee who failed conservative treatment and elected for Total Knee Arthroplasty.   The risks, benefits, and alternatives were discussed at length including but not limited to the risks of infection, bleeding, nerve injury, stiffness, blood clots, the need for revision surgery, cardiopulmonary complications, among others, and they were willing to proceed.  OPERATIVE DESCRIPTION:  The patient was brought to the operative room and placed in a supine position.  General anesthesia was administered.  IV antibiotics were given.  The lower extremity was prepped and draped in the usual sterile fashion.  Chelsey Lee was used to cover all exposed skin. Time out was performed.    Anterior quadriceps tendon splitting approach was performed.  The patella was everted and osteophytes were removed.  The anterior horn of the medial and lateral meniscus was removed.   The distal femur was opened with the drill and the intramedullary distal femoral cutting jig was utilized, set at 5 degrees valgus resecting 9 mm off the distal femur.  Care was taken to protect the collateral ligaments.  Then the extramedullary tibial cutting jig was utilized set for 3 degree posterior slope.  Care was taken during the cut to protect the medial and collateral ligaments.  The proximal tibia was removed along with the posterior horns of the  menisci.  The PCL was sacrificed.    The extensor gap was measured and was approximately 10 mm.    The distal femoral sizing jig was applied, taking care to avoid notching.  Then the 4-in-1 cutting jig was applied and the anterior and posterior femur was cut, along with the chamfer cuts.  All posterior osteophytes were removed.  The flexion gap was then measured and was symmetric with the extension gap.  The distal femoral preparation using the appropriate jig to prepare the box.  The patella was then measured, and cut with the saw.    The proximal tibia sized and prepared accordingly with the reamer and the punch, and then all components were trialed with the poly insert.  The knee was found to have stable balance and full motion.  The knee was irrigated with normal saline and the knee was soaked with TXA.  The above named components were then cemented into place and all excess cement was removed.  The final polyethylene component was in place during cementation.  The knee was kept in extension until the cement hardened.  The knee was then taken through a range of motion and the patella tracked well and the knee irrigated copiously and the parapatellar and subcutaneous tissue closed with vicryl, and skin closed with staples..  A sterile dressing was applied and patient  was taken to the PACU in stable  condition.  There were no complications.  Total tourniquet time was 34 minutes.

## 2022-02-08 NOTE — Anesthesia Procedure Notes (Signed)
Spinal  Patient location during procedure: OR Start time: 02/08/2022 7:39 AM End time: 02/08/2022 7:42 AM Reason for block: surgical anesthesia Staffing Performed: anesthesiologist  Anesthesiologist: Josephine Igo, MD Performed by: Josephine Igo, MD Authorized by: Josephine Igo, MD   Preanesthetic Checklist Completed: patient identified, IV checked, site marked, risks and benefits discussed, surgical consent, monitors and equipment checked, pre-op evaluation and timeout performed Spinal Block Patient position: sitting Prep: DuraPrep and site prepped and draped Patient monitoring: heart rate, cardiac monitor, continuous pulse ox and blood pressure Approach: midline Location: L3-4 Injection technique: single-shot Needle Needle type: Pencan  Needle gauge: 24 G Needle length: 9 cm Needle insertion depth: 7 cm Assessment Sensory level: T6 Events: CSF return Additional Notes Patient tolerated procedure well. Adequate sensory level.

## 2022-02-08 NOTE — H&P (Signed)
TOTAL KNEE ADMISSION H&P  Patient is being admitted for left total knee arthroplasty.  Subjective:  Chief Complaint:left knee pain.  HPI: Chelsey Lee, 67 y.o. female, has a history of pain and functional disability in the left knee due to arthritis and has failed non-surgical conservative treatments for greater than 12 weeks to includeNSAID's and/or analgesics, corticosteriod injections, flexibility and strengthening excercises, weight reduction as appropriate, and activity modification.  Onset of symptoms was gradual, starting 8 years ago with gradually worsening course since that time. The patient noted no past surgery on the left knee(s).  Patient currently rates pain in the left knee(s) at 8 out of 10 with activity. Patient has night pain, worsening of pain with activity and weight bearing, pain that interferes with activities of daily living, pain with passive range of motion, crepitus, and joint swelling.  Patient has evidence of subchondral cysts, subchondral sclerosis, periarticular osteophytes, joint subluxation, and joint space narrowing by imaging studies. This patient has had avascular necrosis of the knee. There is no active infection.  Patient Active Problem List   Diagnosis Date Noted   Pain in left hand 06/29/2021   Cerebrovascular disease 05/11/2020   Gout 03/09/2020   Allergic rhinitis 01/25/2020   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   Colon cancer screening 01/25/2020   Gastroesophageal reflux disease without esophagitis 01/25/2020   Hyperlipidemia 01/25/2020   Morbid obesity ([place]) 01/25/2020   Personal history of colonic polyps 01/25/2020   OSA (obstructive sleep apnea) 01/25/2020   Vitamin D deficiency 01/25/2020   Controlled type 2 diabetes mellitus without complication ([place]) 01/60/1093   Chronic kidney disease, stage 4 (severe) ([place]) 06/29/2019   Long term (current) use of insulin ([place]) 06/29/2019   Epidural lipomatosis 03/02/2019   Spondylolisthesis  of lumbar region 03/02/2019   Spinal stenosis of lumbar region with neurogenic claudication 03/02/2019   Left wrist tendinitis 09/07/2018   Localized osteoarthritis of left knee 04/03/2018   Celiac disease 08/21/2016   Abnormal thyroid function test 04/01/2016   Pain in thumb joint with movement of right hand 09/14/2015   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   Moderate major depression, single episode ([place]) 02/02/2010   Diabetic renal disease ([place]) 06/07/2009   Anemia 01/18/2009   Essential hypertension 01/18/2009   Past Medical History:  Diagnosis Date   Arthritis    Diabetes mellitus    ESRD on HD    T Th Sa   GERD (gastroesophageal reflux disease)    Hyperlipemia    Hypertension    Seasonal allergies    Sleep apnea     Past Surgical History:  Procedure Laterality Date   AV FISTULA PLACEMENT Left 04/10/2020   Procedure: LEFT ARM ARTERIOVENOUS (AV) FISTULA CREATION;  Surgeon: Elam Dutch, MD;  Location: [facility];  Service: Vascular;  Laterality: Left;   CAPSULOTOMY  01/13/2012   Procedure: MINOR CAPSULOTOMY;  Surgeon: Myrtha Mantis., MD;  Location: [facility];  Service: Ophthalmology;  Laterality: Left;   DORSAL COMPARTMENT RELEASE  01/22/2007   rt    FISTULA SUPERFICIALIZATION Left 05/29/2020   Procedure: LEFT UPPER ARM FISTULA SUPERFICIALIZATION WITH LIGATION OF MULTIPLE SIDE BRANCHES;  Surgeon: Elam Dutch, MD;  Location: [facility];  Service: Vascular;  Laterality: Left;   KNEE ARTHROSCOPY  02/21/2002   right and left   STERIOD INJECTION  08/01/2011   Procedure: STEROID INJECTION;  Surgeon: Cammie Sickle., MD;  Location: [facility];  Service: Orthopedics;  Laterality: Left;  cmc left thumb  TONSILLECTOMY     TRIGGER FINGER RELEASE  01/22/2004   rt thumb   TRIGGER FINGER RELEASE  08/01/2011   Procedure: RELEASE TRIGGER FINGER/A-1 PULLEY;  Surgeon: Cammie Sickle., MD;  Location: [facility];  Service: Orthopedics;  Laterality: Left;   left thumb   YAG LASER APPLICATION  16/10/9602   Procedure: YAG LASER APPLICATION;  Surgeon: Myrtha Mantis., MD;  Location: [facility];  Service: Ophthalmology;  Laterality: Left;    Current Facility-Administered Medications  Medication Dose Route Frequency Provider Last Rate Last Admin   0.9 %  sodium chloride infusion   Intravenous Continuous Josephine Igo, MD 10 mL/hr at 02/08/22 0647 New Bag at 02/08/22 0647   ceFAZolin (ANCEF) IVPB 3g/100 mL premix  3 g Intravenous On Call to OR Newt Minion, MD       tranexamic acid (CYKLOKAPRON) IVPB 1,000 mg  1,000 mg Intravenous To OR Suzan Slick, NP       No Known Allergies  Social History   Tobacco Use   Smoking status: Never   Smokeless tobacco: Never  Substance Use Topics   Alcohol use: No    History reviewed. No pertinent family history.   Review of Systems  All other systems reviewed and are negative.   Objective:  Physical Exam  Vital signs in last 24 hours: Temp:  [98 F (36.7 C)] 98 F (36.7 C) (01/19 0600) Pulse Rate:  [65] 65 (01/19 0600) Resp:  [18] 18 (01/19 0600) BP: (109)/(45) 109/45 (01/19 0600) SpO2:  [99 %] 99 % (01/19 0600) Weight:  [117.9 kg] 117.9 kg (01/19 0600)  Labs:   Estimated body mass index is 41.97 kg/m as calculated from the following:   Height as of this encounter: '5\' 6"'$  (1.676 m).   Weight as of this encounter: 117.9 kg.   Imaging Review Plain radiographs demonstrate moderate degenerative joint disease of the left knee(s). The overall alignment ismild valgus. The bone quality appears to be adequate for age and reported activity level.      Assessment/Plan:  End stage arthritis, left knee   The patient history, physical examination, clinical judgment of the provider and imaging studies are consistent with end stage degenerative joint disease of the left knee(s) and total knee arthroplasty is deemed medically necessary. The treatment options including medical management,  injection therapy arthroscopy and arthroplasty were discussed at length. The risks and benefits of total knee arthroplasty were presented and reviewed. The risks due to aseptic loosening, infection, stiffness, patella tracking problems, thromboembolic complications and other imponderables were discussed. The patient acknowledged the explanation, agreed to proceed with the plan and consent was signed. Patient is being admitted for inpatient treatment for surgery, pain control, PT, OT, prophylactic antibiotics, VTE prophylaxis, progressive ambulation and ADL's and discharge planning. The patient is planning to be discharged home with home health services     Patient's anticipated LOS is less than 2 midnights, meeting these requirements: - Younger than 57 - Lives within 1 hour of care - Has a competent adult at home to recover with post-op recover - NO history of  - Chronic pain requiring opiods  - Diabetes  - Coronary Artery Disease  - Heart failure  - Heart attack  - Stroke  - DVT/VTE  - Cardiac arrhythmia  - Respiratory Failure/COPD  - Renal failure  - Anemia  - Advanced Liver disease

## 2022-02-08 NOTE — Transfer of Care (Signed)
Immediate Anesthesia Transfer of Care Note  Patient: Chelsey Lee  Procedure(s) Performed: LEFT TOTAL KNEE ARTHROPLASTY (Left: Knee)  Patient Location: PACU  Anesthesia Type:Spinal  Level of Consciousness: awake, alert , and oriented  Airway & Oxygen Therapy: Patient Spontanous Breathing  Post-op Assessment: Report given to RN and Post -op Vital signs reviewed and stable  Post vital signs: Reviewed and stable  Last Vitals:  Vitals Value Taken Time  BP 110/50 02/08/22 0927  Temp 97.9   Pulse 82 02/08/22 0929  Resp 13 02/08/22 0929  SpO2 97 % 02/08/22 0929  Vitals shown include unvalidated device data.  Last Pain:  Vitals:   02/08/22 0655  TempSrc:   PainSc: 0-No pain      Patients Stated Pain Goal: 3 (49/44/96 7591)  Complications: No notable events documented.

## 2022-02-08 NOTE — Progress Notes (Addendum)
PHARMACY NOTE:  ANTIMICROBIAL RENAL DOSAGE ADJUSTMENT  Current antimicrobial regimen includes a mismatch between antimicrobial dosage and estimated renal function.  As per policy approved by the Pharmacy & Therapeutics and Medical Executive Committees, the antimicrobial dosage will be adjusted accordingly.  Current antimicrobial dosage:  cefazolin 2g IV q6h x 2 doses .  Received cefazolin 3 g IV pre op dose on 02/08/22.   Indication: surgical prophylaxis   Renal Function: ESRD- on HD every TTS  Estimated Creatinine Clearance: 13.3 mL/min (A) (by C-G formula based on SCr of 5.5 mg/dL (H)). '[x]'$      On intermittent HD, scheduled: TTS '[]'$      On CRRT    Antimicrobial dosage has been changed to:  Cefazolin 1g IV q24h x1 dose for 24 hour post op coverage.   Thank you for allowing pharmacy to be a part of this patient's care.  Nicole Cella, RPh Clinical Pharmacist  02/08/2022 12:51 PM

## 2022-02-08 NOTE — Discharge Instructions (Signed)

## 2022-02-08 NOTE — Anesthesia Procedure Notes (Signed)
Anesthesia Regional Block: Adductor canal block   Pre-Anesthetic Checklist: , timeout performed,  Correct Patient, Correct Site, Correct Laterality,  Correct Procedure, Correct Position, site marked,  Risks and benefits discussed,  Surgical consent,  Pre-op evaluation,  At surgeon's request and post-op pain management  Laterality: Left  Prep: chloraprep       Needles:  Injection technique: Single-shot  Needle Type: Echogenic Stimulator Needle     Needle Length: 10cm  Needle Gauge: 21   Needle insertion depth: 8 cm   Additional Needles:   Procedures:,,,, ultrasound used (permanent image in chart),,    Narrative:  Start time: 02/08/2022 7:15 AM End time: 02/08/2022 7:20 AM Injection made incrementally with aspirations every 5 mL.  Performed by: Personally  Anesthesiologist: Josephine Igo, MD  Additional Notes: Timeout performed. Patient sedated. Relevant anatomy ID'd using [place]. Incremental 2-49m injection of LA with frequent aspiration. Patient tolerated procedure well.

## 2022-02-08 NOTE — Evaluation (Signed)
Physical Therapy Evaluation Patient Details Name: Chelsey Lee MRN: [id number] DOB: 02-Jan-1956 Today's Date: 02/08/2022  History of Present Illness  Pt is 67 yo female s/p L TKA on 02/08/22.  Pt with hx including but not limited to gout, ESRD on HD, GERD, HLD, obesity, OSA, DM2, spondylolisthesis, spinal stenosis, celiac disease, HTN, anemia, arthritis  Clinical Impression  Pt is s/p TKA resulting in the deficits listed below (see PT Problem List). 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 She is able to ambulate short community distances using her mother's RW but reports it is too narrow.  Pt reports will have intermittent assist from sister at d/c and they have someone coming into assist during her recovery.  Today, pt limited by severe pain.  Pt was tearful but motivated and wanted to try to do what she could.  She was able to sit on EOB with min A for transfer but unable to progress to standing due to pain.  Pt will benefit from acute PT services, based on clinical judgment suspect pt may need increased therapy/time prior to being able to go home.  Pt will benefit from skilled PT to increase their independence and safety with mobility to allow discharge to the venue listed below.         Recommendations for follow up therapy are one component of a multi-disciplinary discharge planning process, led by the attending physician.  Recommendations may be updated based on patient status, additional functional criteria and insurance authorization.  Follow Up Recommendations Follow physician's recommendations for discharge plan and follow up therapies      Assistance Recommended at Discharge Frequent or constant Supervision/Assistance  Patient can return home with the following  A lot of help with walking and/or transfers;A lot of help with bathing/dressing/bathroom;Assistance with cooking/housework;Help with stairs or ramp for entrance    Equipment Recommendations Rolling walker (2 wheels)  (pt request wider RW -may need bariatric)  Recommendations for Other Services       Functional Status Assessment Patient has had a recent decline in their functional status and demonstrates the ability to make significant improvements in function in a reasonable and predictable amount of time.     Precautions / Restrictions Precautions Precautions: Fall Restrictions Weight Bearing Restrictions: Yes LLE Weight Bearing: Weight bearing as tolerated      Mobility  Bed Mobility Overal bed mobility: Needs Assistance Bed Mobility: Supine to Sit, Sit to Supine     Supine to sit: Min assist, HOB elevated Sit to supine: Min assist, HOB elevated   General bed mobility comments: Assist for L LE; cues and increased time    Transfers                   General transfer comment: deferred due to pain, did lateral scoot toward HOB with min guarding L LE    Ambulation/Gait                  Stairs            Wheelchair Mobility    Modified Rankin (Stroke Patients Only)       Balance Overall balance assessment: Needs assistance Sitting-balance support: No upper extremity supported Sitting balance-Leahy Scale: Good         Standing balance comment: deferred due to pain                             Pertinent Vitals/Pain Pain Assessment  Pain Assessment: 0-10 Pain Score: 10-Worst pain ever ("100") Pain Location: L knee Pain Descriptors / Indicators: Sharp Pain Intervention(s): Limited activity within patient's tolerance, Monitored during session, Premedicated before session, Repositioned, Ice applied    Home Living Family/patient expects to be discharged to:: Private residence Living Arrangements: Parent (pt takes care of her mother) Available Help at Discharge: Family;Available PRN/intermittently;Personal care attendant (Reports sister checks on frequently but they have hired someone to assist during recovery) Type of Home: House Home Access:  Stairs to enter Entrance Stairs-Rails: None Technical brewer of Steps: 1 then 1   Home Layout: One level Home Equipment: Shower seat;BSC/3in1 Additional Comments: Pt does not have RW of her own    Prior Function Prior Level of Function : Independent/Modified Independent             Mobility Comments: Reports ambulating with her mother's walker lately due to knee pain; limited to household or short community ambulation ADLs Comments: Pt reports independent with ADLS and IADLs; helps to take care of her Mother; does not drive     Hand Dominance        Extremity/Trunk Assessment   Upper Extremity Assessment Upper Extremity Assessment: Overall WFL for tasks assessed    Lower Extremity Assessment Lower Extremity Assessment: LLE deficits/detail;RLE deficits/detail RLE Deficits / Details: ROM WFL; MMT 5/5; does reports also needs R TKA LLE Deficits / Details: ROM: knee ~0 to 65 degrees, hip and ankle WFL; MMT: hip and knee 3/5, ankle 5/5 LLE Sensation: WNL    Cervical / Trunk Assessment Cervical / Trunk Assessment: Normal  Communication   Communication: No difficulties  Cognition Arousal/Alertness: Awake/alert Behavior During Therapy: WFL for tasks assessed/performed Overall Cognitive Status: Within Functional Limits for tasks assessed                                 General Comments: Pt in pain and tearful at times but motivated        General Comments General comments (skin integrity, edema, etc.): Pt tearful but motivated. She sat EOB and wanted to work on knee flexion - did 3 reps as tolerated with assist.  Returned to supine and applied ice.  Legs of bed locked straight but did partially trendlenberg bed to help elevate feet.  Also, pt extremely painful and crying.  Did place pillow under L calf and heel cord but not under knee.  Reports some help to elevate, but no significant change.  Requesting pain meds RN notified.    Exercises      Assessment/Plan    PT Assessment Patient needs continued PT services  PT Problem List         PT Treatment Interventions DME instruction;Therapeutic exercise;Gait training;Balance training;Stair training;Modalities;Functional mobility training;Therapeutic activities;Patient/family education    PT Goals (Current goals can be found in the Care Plan section)  Acute Rehab PT Goals Patient Stated Goal: return home; decrease pain PT Goal Formulation: With patient Time For Goal Achievement: 02/22/22 Potential to Achieve Goals: Good    Frequency 7X/week     Co-evaluation               AM-PAC PT "6 Clicks" Mobility  Outcome Measure Help needed turning from your back to your side while in a flat bed without using bedrails?: A Little Help needed moving from lying on your back to sitting on the side of a flat bed without using bedrails?: A Little Help needed moving  to and from a bed to a chair (including a wheelchair)?: A Lot Help needed standing up from a chair using your arms (e.g., wheelchair or bedside chair)?: A Lot Help needed to walk in hospital room?: Total Help needed climbing 3-5 steps with a railing? : Total 6 Click Score: 12    End of Session Equipment Utilized During Treatment: Gait belt Activity Tolerance: Patient tolerated treatment well Patient left: in bed;with call bell/phone within reach;with bed alarm set   PT Visit Diagnosis: Other abnormalities of gait and mobility (R26.89);Muscle weakness (generalized) (M62.81);Pain Pain - Right/Left: Left Pain - part of body: Knee    Time: 4239-5320 PT Time Calculation (min) (ACUTE ONLY): 25 min   Charges:   PT Evaluation $PT Eval Low Complexity: 1 Low PT Treatments $Therapeutic Activity: 8-22 mins        Abran Richard, PT Acute Rehab [facility] [phone]   Karlton Lemon 02/08/2022, 5:18 PM

## 2022-02-09 DIAGNOSIS — Z79899 Other long term (current) drug therapy: Secondary | ICD-10-CM | POA: Diagnosis not present

## 2022-02-09 DIAGNOSIS — Z794 Long term (current) use of insulin: Secondary | ICD-10-CM | POA: Diagnosis not present

## 2022-02-09 DIAGNOSIS — E559 Vitamin D deficiency, unspecified: Secondary | ICD-10-CM | POA: Diagnosis present

## 2022-02-09 DIAGNOSIS — G4733 Obstructive sleep apnea (adult) (pediatric): Secondary | ICD-10-CM | POA: Diagnosis present

## 2022-02-09 DIAGNOSIS — Z7984 Long term (current) use of oral hypoglycemic drugs: Secondary | ICD-10-CM | POA: Diagnosis not present

## 2022-02-09 DIAGNOSIS — J309 Allergic rhinitis, unspecified: Secondary | ICD-10-CM | POA: Diagnosis present

## 2022-02-09 DIAGNOSIS — D631 Anemia in chronic kidney disease: Secondary | ICD-10-CM | POA: Diagnosis present

## 2022-02-09 DIAGNOSIS — E1122 Type 2 diabetes mellitus with diabetic chronic kidney disease: Secondary | ICD-10-CM | POA: Diagnosis present

## 2022-02-09 DIAGNOSIS — N186 End stage renal disease: Secondary | ICD-10-CM | POA: Diagnosis present

## 2022-02-09 DIAGNOSIS — M1712 Unilateral primary osteoarthritis, left knee: Secondary | ICD-10-CM | POA: Diagnosis present

## 2022-02-09 DIAGNOSIS — K219 Gastro-esophageal reflux disease without esophagitis: Secondary | ICD-10-CM | POA: Diagnosis present

## 2022-02-09 DIAGNOSIS — Z6841 Body Mass Index (BMI) 40.0 and over, adult: Secondary | ICD-10-CM | POA: Diagnosis not present

## 2022-02-09 DIAGNOSIS — I120 Hypertensive chronic kidney disease with stage 5 chronic kidney disease or end stage renal disease: Secondary | ICD-10-CM | POA: Diagnosis present

## 2022-02-09 DIAGNOSIS — N2581 Secondary hyperparathyroidism of renal origin: Secondary | ICD-10-CM | POA: Diagnosis present

## 2022-02-09 DIAGNOSIS — M109 Gout, unspecified: Secondary | ICD-10-CM | POA: Diagnosis present

## 2022-02-09 DIAGNOSIS — E785 Hyperlipidemia, unspecified: Secondary | ICD-10-CM | POA: Diagnosis present

## 2022-02-09 DIAGNOSIS — M179 Osteoarthritis of knee, unspecified: Secondary | ICD-10-CM | POA: Diagnosis present

## 2022-02-09 DIAGNOSIS — Z992 Dependence on renal dialysis: Secondary | ICD-10-CM | POA: Diagnosis not present

## 2022-02-09 DIAGNOSIS — E119 Type 2 diabetes mellitus without complications: Secondary | ICD-10-CM | POA: Diagnosis present

## 2022-02-09 DIAGNOSIS — Z7951 Long term (current) use of inhaled steroids: Secondary | ICD-10-CM | POA: Diagnosis not present

## 2022-02-09 LAB — GLUCOSE, CAPILLARY
Glucose-Capillary: 207 mg/dL — ABNORMAL HIGH (ref 70–99)
Glucose-Capillary: 190 mg/dL — ABNORMAL HIGH (ref 70–99)

## 2022-02-09 LAB — RENAL FUNCTION PANEL
Albumin: 3.3 g/dL — ABNORMAL LOW (ref 3.5–5.0)
Anion gap: 14 (ref 5–15)
BUN: 52 mg/dL — ABNORMAL HIGH (ref 8–23)
CO2: 27 mmol/L (ref 22–32)
Calcium: 9.2 mg/dL (ref 8.9–10.3)
Chloride: 94 mmol/L — ABNORMAL LOW (ref 98–111)
Creatinine, Ser: 6.45 mg/dL — ABNORMAL HIGH (ref 0.44–1.00)
GFR, Estimated: 7 mL/min — ABNORMAL LOW (ref >60)
Glucose, Bld: 152 mg/dL — ABNORMAL HIGH (ref 70–99)
Phosphorus: 5.9 mg/dL — ABNORMAL HIGH (ref 2.5–4.6)
Potassium: 4.1 mmol/L (ref 3.5–5.1)
Sodium: 135 mmol/L (ref 135–145)

## 2022-02-09 LAB — CBC
HCT: 31.3 % — ABNORMAL LOW (ref 36.0–46.0)
Hemoglobin: 10.1 g/dL — ABNORMAL LOW (ref 12.0–15.0)
MCH: 31.7 pg (ref 26.0–34.0)
MCHC: 32.3 g/dL (ref 30.0–36.0)
MCV: 98.1 fL (ref 80.0–100.0)
Platelets: 197 10*3/uL (ref 150–400)
RBC: 3.19 MIL/uL — ABNORMAL LOW (ref 3.87–5.11)
RDW: 13.6 % (ref 11.5–15.5)
WBC: 8.4 10*3/uL (ref 4.0–10.5)
nRBC: 0 % (ref 0.0–0.2)

## 2022-02-09 LAB — HEPATITIS B SURFACE ANTIBODY, QUANTITATIVE: Hep B S AB Quant (Post): 17.5 m[IU]/mL (ref >9.9)

## 2022-02-09 MED ORDER — PENTAFLUOROPROP-TETRAFLUOROETH EX AERO
INHALATION_SPRAY | CUTANEOUS | Status: AC
  Filled 2022-02-09: qty 30

## 2022-02-09 NOTE — Progress Notes (Signed)
  [facility] Progress Note   Subjective:   Patient seen and examined at bedside. Pain in left knee but otherwise feeling ok.  Denies CP, SOB, abdominal pain and n/v/d. Waiting for breakfast.   Objective Vitals:   02/08/22 1200 02/08/22 1215 02/08/22 1234 02/08/22 1641  BP: (!) 119/50 133/60 (!) 119/50 (!) 123/53  Pulse: 64 79 66 71  Resp: '14 17 17 17  '$ Temp:  97.6 F (36.4 C) (!) 97.5 F (36.4 C) 97.8 F (36.6 C)  TempSrc:   Oral   SpO2: 95% 92% 100% 100%  Weight:      Height:       Physical Exam General:well appearing female in NAD Heart:RRR, soft systolic murmur Lungs:CTAB, nml WOB on RA Abdomen:soft, NTND Extremities:LLE wrapped, no edemao on R Dialysis Access: LU AVF +b/t   Filed Weights   02/08/22 0600  Weight: 117.9 kg    Intake/Output Summary (Last 24 hours) at 02/09/2022 0803 Last data filed at 02/08/2022 0925 Gross per 24 hour  Intake 500 ml  Output 75 ml  Net 425 ml    Additional Objective Labs: Basic Metabolic Panel: Recent Labs  Lab 02/08/22 0620  NA 141  K 3.9  CL 96*  GLUCOSE 68*  BUN 36*  CREATININE 5.50*   Liver Function Tests: No results for input(s): "AST", "ALT", "ALKPHOS", "BILITOT", "PROT", "ALBUMIN" in the last 168 hours. No results for input(s): "LIPASE", "AMYLASE" in the last 168 hours. CBC: Recent Labs  Lab 02/08/22 0620  HGB 10.5*  HCT 31.0*    Medications:  sodium chloride Stopped (02/08/22 1300)    ceFAZolin (ANCEF) IV     methocarbamol (ROBAXIN) IV      allopurinol  100 mg Oral Daily   aspirin EC  325 mg Oral Q breakfast   atorvastatin  20 mg Oral Daily   buPROPion  150 mg Oral BID   carvedilol  25 mg Oral BID WC   Chlorhexidine Gluconate Cloth  6 each Topical Q0600   docusate sodium  100 mg Oral BID   escitalopram  10 mg Oral Daily   fenofibrate  160 mg Oral Daily   ferric citrate  420 mg Oral TID WC   [START ON 02/10/2022] furosemide  80 mg Oral Once per day on Sun Mon Wed Fri   insulin aspart   0-15 Units Subcutaneous TID WC   insulin aspart  4 Units Subcutaneous TID WC   isosorbide mononitrate  30 mg Oral Daily   linaclotide  145 mcg Oral QAC breakfast   sevelamer carbonate  1,600 mg Oral TID WC    Dialysis Orders: [facility] - [facility]  4hrs, BFR 350, DFR 600,  EDW 122.6kg, 2K/ 2.5Ca   Access: LU AVF - 16G needles  Heparin 3200 Hectorol 54mg IV qHD    Assessment/Plan:  Osteoarthritis - s/p L TKR 02/09/22  ESRD -  HD today per regular schedule  Hypertension/volume  - BP well controlled. Does not appear volume overloaded.  UF as tolerated.   Anemia of CKD - hgb 10.5. Not currently on ESA, follow labs.   Secondary Hyperparathyroidism -  Check Ca and phos pre HD.  Continue VDRA and binders - auryxia 2 AC.   Nutrition - Renal diet w/fluid restrictions.  DMT2 - per primary  LJen Mow PA-C [facility] 02/09/2022,8:03 AM  LOS: 0 days

## 2022-02-09 NOTE — Progress Notes (Signed)
Patient ID: Chelsey Lee, female   DOB: [birth year], 67 y.o.   MRN: [id number] Patient progressing slowly with physical therapy.  Anticipate discharge to home on Monday.  Orders placed for evaluation for skilled nursing placement.

## 2022-02-09 NOTE — TOC Progression Note (Signed)
Transition of Care [facility]) - Progression Note    Patient Details  Name: MAZEL VILLELA MRN: [id number] Date of Birth: 07/11/1955  Transition of Care [facility]) CM/SW Contact  Bartholomew Crews, RN Phone Number: [phone] 02/09/2022, 8:29 AM  Clinical Narrative:     Chart reviewed. L TKA yesterday. Patient is orthobundle. HH PT arranged with [facility] via [place]. PT recommendations for wide RW - RNCM to f/u with patient for DME needs.  TOC following for transition needs.   Expected Discharge Plan: [place] Barriers to Discharge: Continued Medical Work up  Expected Discharge Plan and Services                                   HH Arranged: PT [facility] Agency: [place] Date [place]: 02/09/22 Time [place]: [phone] Representative spoke with at [place]: orthobundle   Social Determinants of Health ([place]) Interventions SDOH Screenings   Food Insecurity: No Food Insecurity (02/08/2022)  Housing: Low Risk  (02/08/2022)  Transportation Needs: No Transportation Needs (02/08/2022)  Utilities: Not At Risk (02/08/2022)  Depression (PHQ2-9): Low Risk  (11/08/2019)  Tobacco Use: Low Risk  (02/08/2022)    Readmission Risk Interventions     No data to display

## 2022-02-09 NOTE — Progress Notes (Signed)
O2 applied to maintain Sats >90%

## 2022-02-09 NOTE — TOC Initial Note (Signed)
Transition of Care [facility]) - Initial/Assessment Note    Patient Details  Name: Chelsey Lee MRN: [id number] Date of Birth: 26-Apr-1955  Transition of Care [facility]) CM/SW Contact:    Bartholomew Crews, RN Phone Number: [phone] 02/09/2022, 9:57 AM  Clinical Narrative:                  Spoke with patient at the bedside to discuss post acute transition. Confirmed home address. Attends HD TTS - her sister provides transportation. Patient is usual caregiver for her mom. Discussed recommendations for DME bariatric RW - no preference in provider, referral to [facility] for delivery to the room. TOC following for transition needs.   Expected Discharge Plan: [place] Barriers to Discharge: Continued Medical Work up   Patient Goals and CMS Choice Patient states their goals for this hospitalization and ongoing recovery are:: return home with family support CMS Medicare.gov Compare Post Acute Care list provided to:: Patient        Expected Discharge Plan and Services   Discharge Planning Services: CM Consult                     DME Arranged: Gilford Rile rolling DME Agency: [employer] Date DME Agency Contacted: 02/09/22 Time DME Agency Contacted: [phone] Representative spoke with at DME Agency: Mardene Celeste HH Arranged: PT [place]: [place] Date [place]: 02/09/22 Time [place]: [phone] Representative spoke with at [place]: [place] Arrangements/Services     Patient language and need for interpreter reviewed:: Yes Do you feel safe going back to the place where you live?: Yes      Need for Family Participation in Patient Care: Yes (Comment) Care giver support system in place?: Yes (comment)   Criminal Activity/Legal Involvement Pertinent to Current Situation/Hospitalization: No - Comment as needed  Activities of Daily Living Home Assistive Devices/Equipment: Electric scooter ADL Screening (condition at time of  admission) Patient's cognitive ability adequate to safely complete daily activities?: Yes Is the patient deaf or have difficulty hearing?: No Does the patient have difficulty seeing, even when wearing glasses/contacts?: No Does the patient have difficulty concentrating, remembering, or making decisions?: No Patient able to express need for assistance with ADLs?: Yes Does the patient have difficulty dressing or bathing?: No Independently performs ADLs?: Yes (appropriate for developmental age) (yes, PTA. PT already ordered for post surgical eval) Does the patient have difficulty walking or climbing stairs?: Yes Weakness of Legs: Left Weakness of Arms/Hands: None  Permission Sought/Granted                  Emotional Assessment Appearance:: Appears stated age Attitude/Demeanor/Rapport: Engaged Affect (typically observed): Accepting Orientation: : Oriented to Situation, Oriented to  Time, Oriented to Place, Oriented to Self Alcohol / Substance Use: Not Applicable Psych Involvement: No (comment)  Admission diagnosis:  OA (osteoarthritis) of knee [M17.9] Total knee replacement status, left [Z96.652] Patient Active Problem List   Diagnosis Date Noted   OA (osteoarthritis) of knee 02/08/2022   Total knee replacement status, left 02/08/2022   Pain in left hand 06/29/2021   Cerebrovascular disease 05/11/2020   Gout 03/09/2020   Allergic rhinitis 01/25/2020   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   Colon cancer screening 01/25/2020   Gastroesophageal reflux disease without esophagitis 01/25/2020   Hyperlipidemia 01/25/2020   Morbid obesity ([place]) 01/25/2020   Personal history of colonic polyps 01/25/2020   OSA (obstructive sleep apnea) 01/25/2020   Vitamin D deficiency  01/25/2020   Controlled type 2 diabetes mellitus without complication ([place]) 15/72/6203   Chronic kidney disease, stage 4 (severe) ([place]) 06/29/2019   Long term (current) use of insulin ([place]) 06/29/2019    Epidural lipomatosis 03/02/2019   Spondylolisthesis of lumbar region 03/02/2019   Spinal stenosis of lumbar region with neurogenic claudication 03/02/2019   Left wrist tendinitis 09/07/2018   Unilateral primary osteoarthritis, left knee 04/03/2018   Celiac disease 08/21/2016   Abnormal thyroid function test 04/01/2016   Pain in thumb joint with movement of right hand 09/14/2015   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   Moderate major depression, single episode ([place]) 02/02/2010   Diabetic renal disease ([place]) 06/07/2009   Anemia 01/18/2009   Essential hypertension 01/18/2009   PCP:  Willey Blade, MD Pharmacy:   [facility] [place], [place] - [address] [place] [address] Phone: [phone] Fax: [phone]     Social Determinants of Health (SDOH) Social History: SDOH Screenings   Food Insecurity: No Food Insecurity (02/08/2022)  Housing: Low Risk  (02/08/2022)  Transportation Needs: No Transportation Needs (02/08/2022)  Utilities: Not At Risk (02/08/2022)  Depression (PHQ2-9): Low Risk  (11/08/2019)  Tobacco Use: Low Risk  (02/08/2022)   SDOH Interventions:     Readmission Risk Interventions     No data to display

## 2022-02-09 NOTE — TOC Progression Note (Signed)
Transition of Care [facility]) - Progression Note    Patient Details  Name: Chelsey Lee MRN: [id number] Date of Birth: 12-31-1955  Transition of Care [facility]) CM/SW Contact  Bartholomew Crews, RN Phone Number: [phone] 02/09/2022, 4:48 PM  Clinical Narrative:     Received call from patient's sister, Dyke Maes, to discuss post transition. Valerie Salts stated that patient is in too much pain and limited mobility that she can not provide care for patient at home until she gets some rehab. Reviewed SNFs on [email or web address]. Valerie Salts states that first choice is for [facility] and 2nd choice is for [facility]. Patient receives HD at [facility] FL2 faxed out to local SNFs. Patient will need PASRR - will request CSW assist. TOC following for transition needs.   Expected Discharge Plan: [place] Barriers to Discharge: Continued Medical Work up  Expected Discharge Plan and Services   Discharge Planning Services: CM Consult                     DME Arranged: Gilford Rile rolling DME Agency: [employer] Date DME Agency Contacted: 02/09/22 Time DME Agency Contacted: [phone] Representative spoke with at DME Agency: Dennard Schaumann Arranged: PT [place]: [place] Date [place]: 02/09/22 Time [place]: [phone] Representative spoke with at [place]: [place] Determinants of Health ([place]) Interventions SDOH Screenings   Food Insecurity: No Food Insecurity (02/08/2022)  Housing: Low Risk  (02/08/2022)  Transportation Needs: No Transportation Needs (02/08/2022)  Utilities: Not At Risk (02/08/2022)  Depression (PHQ2-9): Low Risk  (11/08/2019)  Tobacco Use: Low Risk  (02/08/2022)    Readmission Risk Interventions     No data to display

## 2022-02-09 NOTE — NC FL2 (Signed)
[place] LEVEL OF CARE FORM     IDENTIFICATION  Patient Name: ADALIZ DOBIS Birthdate: 1955-07-24 Sex: female Admission Date (Current Location): 02/08/2022  [facility] and [place] Number:  Herbalist and Address:  [facility], [place] [address] [place], [place] [address]      Provider Number: [id number]  Attending Physician Name and Address:  Newt Minion, MD  Relative Name and Phone Number:  Dyke Maes  [phone]    Current Level of Care: Hospital Recommended Level of Care: [place] Prior Approval Number:    Date Approved/Denied:   PASRR Number:    Discharge Plan: SNF    Current Diagnoses: Patient Active Problem List   Diagnosis Date Noted   OA (osteoarthritis) of knee 02/08/2022   Total knee replacement status, left 02/08/2022   Pain in left hand 06/29/2021   Cerebrovascular disease 05/11/2020   Gout 03/09/2020   Allergic rhinitis 01/25/2020   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   Colon cancer screening 01/25/2020   Gastroesophageal reflux disease without esophagitis 01/25/2020   Hyperlipidemia 01/25/2020   Morbid obesity ([place]) 01/25/2020   Personal history of colonic polyps 01/25/2020   OSA (obstructive sleep apnea) 01/25/2020   Vitamin D deficiency 01/25/2020   Controlled type 2 diabetes mellitus without complication ([place]) 41/66/0630   Chronic kidney disease, stage 4 (severe) ([place]) 06/29/2019   Long term (current) use of insulin ([place]) 06/29/2019   Epidural lipomatosis 03/02/2019   Spondylolisthesis of lumbar region 03/02/2019   Spinal stenosis of lumbar region with neurogenic claudication 03/02/2019   Left wrist tendinitis 09/07/2018   Unilateral primary osteoarthritis, left knee 04/03/2018   Celiac disease 08/21/2016   Abnormal thyroid function test 04/01/2016   Pain in thumb joint with movement of right hand 09/14/2015   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   Moderate major depression,  single episode ([place]) 02/02/2010   Diabetic renal disease ([place]) 06/07/2009   Anemia 01/18/2009   Essential hypertension 01/18/2009    Orientation RESPIRATION BLADDER Height & Weight     Self, Time, Situation, Place  Normal Continent Weight: 122.4 kg Height:  '5\' 6"'$  (167.6 cm)  BEHAVIORAL SYMPTOMS/MOOD NEUROLOGICAL BOWEL NUTRITION STATUS      Continent Diet  AMBULATORY STATUS COMMUNICATION OF NEEDS Skin   Extensive Assist Verbally Surgical wounds                       Personal Care Assistance Level of Assistance  Bathing, Feeding, Dressing Bathing Assistance: Limited assistance Feeding assistance: Independent Dressing Assistance: Limited assistance     Functional Limitations Info  Sight, Hearing, Speech Sight Info: Adequate Hearing Info: Adequate Speech Info: Adequate    SPECIAL CARE FACTORS FREQUENCY  PT (By licensed PT), OT (By licensed OT)     PT Frequency: PT at SNF to eval and treat a minimum of 5x/week OT Frequency: OT at SNF to eval and treat a minimum of 5x/week            Contractures Contractures Info: Present    Additional Factors Info  Code Status, Allergies Code Status Info: Full Allergies Info: None Known           Current Medications (02/09/2022):  This is the current hospital active medication list Current Facility-Administered Medications  Medication Dose Route Frequency Provider Last Rate Last Admin   0.9 %  sodium chloride infusion   Intravenous Continuous Newt Minion, MD   Stopped at 02/08/22 1300  acetaminophen (TYLENOL) tablet 325-650 mg  325-650 mg Oral Q6H PRN Newt Minion, MD       allopurinol (ZYLOPRIM) tablet 100 mg  100 mg Oral Daily Newt Minion, MD   100 mg at 02/09/22 7654   aspirin EC tablet 325 mg  325 mg Oral Q breakfast Newt Minion, MD   325 mg at 02/09/22 0925   atorvastatin (LIPITOR) tablet 20 mg  20 mg Oral Daily Newt Minion, MD   20 mg at 02/09/22 6503   bisacodyl (DULCOLAX) suppository 10 mg  10 mg  Rectal Daily PRN Newt Minion, MD       buPROPion [facility] SR) 12 hr tablet 150 mg  150 mg Oral BID Newt Minion, MD   150 mg at 02/09/22 0925   carvedilol (COREG) tablet 25 mg  25 mg Oral BID WC Newt Minion, MD   25 mg at 02/09/22 5465   Chlorhexidine Gluconate Cloth 2 % PADS 6 each  6 each Topical Q0600 Penninger, Ria Comment, PA   6 each at 02/09/22 6812   docusate sodium (COLACE) capsule 100 mg  100 mg Oral BID Newt Minion, MD   100 mg at 02/09/22 0926   escitalopram (LEXAPRO) tablet 10 mg  10 mg Oral Daily Newt Minion, MD   10 mg at 02/09/22 7517   fenofibrate tablet 160 mg  160 mg Oral Daily Newt Minion, MD   160 mg at 02/09/22 0017   ferric citrate (AURYXIA) tablet 420 mg  420 mg Oral TID WC Newt Minion, MD   420 mg at 02/09/22 1207   And   ferric citrate (AURYXIA) tablet 210 mg  210 mg Oral BID PRN Newt Minion, MD       [START ON 02/10/2022] furosemide (LASIX) tablet 80 mg  80 mg Oral Once per day on Sun Mon Wed Fri Newt Minion, MD       HYDROmorphone (DILAUDID) injection 0.5-1 mg  0.5-1 mg Intravenous Q2H PRN Newt Minion, MD   1 mg at 02/09/22 1205   insulin aspart (novoLOG) injection 0-15 Units  0-15 Units Subcutaneous TID WC Newt Minion, MD   3 Units at 02/09/22 1207   insulin aspart (novoLOG) injection 4 Units  4 Units Subcutaneous TID WC Newt Minion, MD   4 Units at 02/09/22 1207   isosorbide mononitrate (IMDUR) 24 hr tablet 30 mg  30 mg Oral Daily Newt Minion, MD   30 mg at 02/09/22 4944   linaclotide (LINZESS) capsule 145 mcg  145 mcg Oral QAC breakfast Newt Minion, MD   145 mcg at 02/09/22 0701   magnesium citrate solution 1 Bottle  1 Bottle Oral Once PRN Newt Minion, MD       menthol-cetylpyridinium (CEPACOL) lozenge 3 mg  1 lozenge Oral PRN Newt Minion, MD       Or   phenol (CHLORASEPTIC) mouth spray 1 spray  1 spray Mouth/Throat PRN Newt Minion, MD       methocarbamol (ROBAXIN) tablet 500 mg  500 mg Oral Q6H PRN Newt Minion, MD    500 mg at 02/09/22 9675   Or   methocarbamol (ROBAXIN) 500 mg in dextrose 5 % 50 mL IVPB  500 mg Intravenous Q6H PRN Newt Minion, MD       metoCLOPramide (REGLAN) tablet 5-10 mg  5-10 mg Oral Q8H PRN Newt Minion, MD  Or   metoCLOPramide (REGLAN) injection 5-10 mg  5-10 mg Intravenous Q8H PRN Newt Minion, MD       ondansetron [facility]) tablet 4 mg  4 mg Oral Q6H PRN Newt Minion, MD       Or   ondansetron [facility]) injection 4 mg  4 mg Intravenous Q6H PRN Newt Minion, MD       oxyCODONE (Oxy IR/ROXICODONE) immediate release tablet 10-15 mg  10-15 mg Oral Q4H PRN Newt Minion, MD   15 mg at 02/09/22 1349   oxyCODONE (Oxy IR/ROXICODONE) immediate release tablet 5-10 mg  5-10 mg Oral Q4H PRN Newt Minion, MD   5 mg at 02/08/22 1330   pentafluoroprop-tetrafluoroeth (GEBAUERS) aerosol            polyethylene glycol (MIRALAX / GLYCOLAX) packet 17 g  17 g Oral Daily PRN Newt Minion, MD       sevelamer carbonate (RENVELA) tablet 1,600 mg  1,600 mg Oral TID WC Newt Minion, MD   1,600 mg at 02/09/22 1208   zolpidem (AMBIEN) tablet 5 mg  5 mg Oral QHS PRN Newt Minion, MD         Discharge Medications: Please see discharge summary for a list of discharge medications.  Relevant Imaging Results:  Relevant Lab Results:   Additional Information HD at [facility] [place] [facility] [place] [id number]  Bartholomew Crews, RN

## 2022-02-09 NOTE — Care Management Obs Status (Addendum)
[place] NOTIFICATION   Patient Details  Name: Chelsey Lee MRN: [id number] Date of Birth: [birth year]   Medicare Observation Status Notification Given:   Yes    Bartholomew Crews, RN 02/09/2022, 9:55 AM

## 2022-02-09 NOTE — Progress Notes (Signed)
PT Cancellation Note  Patient Details Name: CLARISA DANSER MRN: [id number] DOB: 1955/12/12   Cancelled Treatment:    Reason Eval/Treat Not Completed: (P) Patient at procedure or test/unavailable, pt off unit for HD. Will check back as schedule allows to continue with PT POC.  Audry Riles. PTA Acute Rehabilitation Services Office: [place] 02/09/2022, 2:53 PM

## 2022-02-09 NOTE — Progress Notes (Signed)
Pt received in unit w/ stable VS, pt is c/o 10 out of 10 surgical pain, will re assess for prn pain medication. Pt stable to begin tx. Consent obtained at bedside.

## 2022-02-09 NOTE — Progress Notes (Signed)
Physical Therapy Treatment Patient Details Name: Chelsey Lee MRN: [id number] DOB: 1955-09-28 Today's Date: 02/09/2022   History of Present Illness Pt is 67 yo female s/p L TKA on 02/08/22.  Pt with hx including but not limited to gout, ESRD on HD, GERD, HLD, obesity, OSA, DM2, spondylolisthesis, spinal stenosis, celiac disease, HTN, anemia, arthritis    PT Comments    Pt greeted semi-reclined in bed and motivated for session, however continues to be limited by pain despite pre-medication. Pt tearfull throughout mobility, encouraged breathing techniques with good return. Pt able to progress transfers this date completing x2 STS transfers to RW with mod a +2 initially down to mod a of 1. Pt able to shuffle feet along EOB toward [facility], however unable to bear weight through LLE sufficiently to progress gait this session. Plan to continue to progress mobility within pt tolerance in PM session. Pt continues to benefit from skilled PT services to progress toward functional mobility goals.    Recommendations for follow up therapy are one component of a multi-disciplinary discharge planning process, led by the attending physician.  Recommendations may be updated based on patient status, additional functional criteria and insurance authorization.  Follow Up Recommendations  Follow physician's recommendations for discharge plan and follow up therapies     Assistance Recommended at Discharge Frequent or constant Supervision/Assistance  Patient can return home with the following A lot of help with walking and/or transfers;A lot of help with bathing/dressing/bathroom;Assistance with cooking/housework;Help with stairs or ramp for entrance   Equipment Recommendations  Rolling walker (2 wheels) (pt request wider RW -may need bariatric)    Recommendations for Other Services       Precautions / Restrictions Precautions Precautions: Fall Restrictions Weight Bearing Restrictions: Yes LLE Weight Bearing:  Weight bearing as tolerated     Mobility  Bed Mobility Overal bed mobility: Needs Assistance Bed Mobility: Supine to Sit, Sit to Supine     Supine to sit: Min assist, HOB elevated Sit to supine: Min assist, HOB elevated   General bed mobility comments: Assist for L LE; cues and increased time    Transfers Overall transfer level: Needs assistance Equipment used: Rolling walker (2 wheels) Transfers: Sit to/from Stand Sit to Stand: Mod assist, +2 physical assistance, From elevated surface           General transfer comment: mod a +2 for initial stand, down to mod a of 1 on second stand. pt needing cues for hand placement, cues to extend LLE anterior before coming to sit for decreased pain    Ambulation/Gait             Pre-gait activities: able to shuffle feet laterally to R to move toward HOB, marching LLE in standing x5     Stairs             Wheelchair Mobility    Modified Rankin (Stroke Patients Only)       Balance Overall balance assessment: Needs assistance Sitting-balance support: No upper extremity supported Sitting balance-Leahy Scale: Good     Standing balance support: Bilateral upper extremity supported, Reliant on assistive device for balance Standing balance-Leahy Scale: Poor Standing balance comment: heavy reliance on BUE support of RW and external asssit                            Cognition Arousal/Alertness: Awake/alert Behavior During Therapy: WFL for tasks assessed/performed Overall Cognitive Status: Within Functional Limits for tasks assessed  General Comments: Pt in pain and tearful at times but motivated, encouraged breathing techniques throughout, pt prefers to inititate mobility on own count        Exercises      General Comments General comments (skin integrity, edema, etc.): VSS on RA, pt tearful durign mobility however motivated, encouraged breathing techniques  with good return      Pertinent Vitals/Pain Pain Assessment Pain Assessment: Faces Faces Pain Scale: Hurts whole lot Pain Location: L knee Pain Descriptors / Indicators: Sharp Pain Intervention(s): Premedicated before session, Monitored during session, Limited activity within patient's tolerance, Repositioned    Home Living                          Prior Function            PT Goals (current goals can now be found in the care plan section) Acute Rehab PT Goals PT Goal Formulation: With patient Time For Goal Achievement: 02/22/22 Progress towards PT goals: Progressing toward goals (slowly)    Frequency    7X/week      PT Plan      Co-evaluation              AM-PAC PT "6 Clicks" Mobility   Outcome Measure  Help needed turning from your back to your side while in a flat bed without using bedrails?: A Little Help needed moving from lying on your back to sitting on the side of a flat bed without using bedrails?: A Little Help needed moving to and from a bed to a chair (including a wheelchair)?: A Lot Help needed standing up from a chair using your arms (e.g., wheelchair or bedside chair)?: A Lot Help needed to walk in hospital room?: Total Help needed climbing 3-5 steps with a railing? : Total 6 Click Score: 12    End of Session Equipment Utilized During Treatment: Gait belt Activity Tolerance: Patient tolerated treatment well;Patient limited by pain Patient left: in bed;with call bell/phone within reach;with bed alarm set;Other (comment) (with TOC present) Nurse Communication: Mobility status PT Visit Diagnosis: Other abnormalities of gait and mobility (R26.89);Muscle weakness (generalized) (M62.81);Pain Pain - Right/Left: Left Pain - part of body: Knee     Time: 1791-5056 PT Time Calculation (min) (ACUTE ONLY): 28 min  Charges:  $Gait Training: 8-22 mins $Therapeutic Activity: 8-22 mins                     Toye Rouillard R. PTA Acute Rehabilitation  Services Office: [place] 02/09/2022, 9:53 AM

## 2022-02-09 NOTE — TOC Progression Note (Signed)
Transition of Care [facility]) - Progression Note    Patient Details  Name: CASEE KNEPP MRN: [id number] Date of Birth: [birth year]  Transition [facility]) CM/SW Contact  Bartholomew Crews, RN Phone Number: [phone] 02/09/2022, 12:22 PM  Clinical Narrative:     Spoke with patient and her sister, Valerie Salts, at the bedside. PTA patient home with her mom who she is primary caregiver. Valerie Salts expresses concern about patient transitioning home from hospital without going to SNF first. Advised that patient would need to be deemed eligible for inpatient in order to qualify for SNF benefit and then would need to be inpatient 3 days. Valerie Salts also stated that patient will need a walker and a wheelchair. Advised that insurance is not likely to cover both, and advised that if patient is eligible for SNF that SNF will provide referral for DME. TOC following for transition needs.   Expected Discharge Plan: [place] Barriers to Discharge: Continued Medical Work up  Expected Discharge Plan and Services   Discharge Planning Services: CM Consult                     DME Arranged: Gilford Rile rolling DME Agency: [employer] Date DME Agency Contacted: 02/09/22 Time DME Agency Contacted: [phone] Representative spoke with at DME Agency: Dennard Schaumann Arranged: PT [place]: [place] Date [place]: 02/09/22 Time [place]: [phone] Representative spoke with at [place]: [place] Determinants of Health ([place]) Interventions SDOH Screenings   Food Insecurity: No Food Insecurity (02/08/2022)  Housing: Low Risk  (02/08/2022)  Transportation Needs: No Transportation Needs (02/08/2022)  Utilities: Not At Risk (02/08/2022)  Depression (PHQ2-9): Low Risk  (11/08/2019)  Tobacco Use: Low Risk  (02/08/2022)    Readmission Risk Interventions     No data to display

## 2022-02-09 NOTE — Progress Notes (Signed)
   02/09/22 1855  Vitals  Temp 98.4 F (36.9 C)  Pulse Rate 84  Resp 17  BP (!) 138/59  SpO2 98 %  O2 Device Nasal Cannula  Weight 120.4 kg  Type of Weight Post-Dialysis  Oxygen Therapy  O2 Flow Rate (L/min) 2 L/min  Patient Activity (if Appropriate) In bed  Post Treatment  Dialyzer Clearance Lightly streaked  Duration of HD Treatment -hour(s) 4 hour(s)  Liters Processed 80.4  Fluid Removed (mL) 3000 mL  Tolerated HD Treatment Yes  Post-Hemodialysis Comments Tx tolerated well, pt has no complaintsz, Pt discharged stable.  AVG/AVF Arterial Site Held (minutes) 8 minutes  AVG/AVF Venous Site Held (minutes) 4 minutes

## 2022-02-10 LAB — GLUCOSE, CAPILLARY
Glucose-Capillary: 159 mg/dL — ABNORMAL HIGH (ref 70–99)
Glucose-Capillary: 183 mg/dL — ABNORMAL HIGH (ref 70–99)
Glucose-Capillary: 178 mg/dL — ABNORMAL HIGH (ref 70–99)
Glucose-Capillary: 208 mg/dL — ABNORMAL HIGH (ref 70–99)

## 2022-02-10 NOTE — Progress Notes (Signed)
[facility] Progress Note   Subjective:   Patient seen and examined at bedside.  Pain 9/10 this AM.  Tolerated dialysis well yesterday.  Plan to go to SNF on discharge.  Denies CP, SOB, abdominal pain and n/v/d.   Objective Vitals:   02/09/22 1825 02/09/22 1855 02/09/22 2029 02/10/22 0802  BP: (!) 145/57 (!) 138/59 (!) 123/45 (!) 122/44  Pulse: 79 84 82 92  Resp: '17 17 18 17  '$ Temp:  98.4 F (36.9 C) 98.2 F (36.8 C) 98.6 F (37 C)  TempSrc:    Oral  SpO2: 99% 98% 97% 93%  Weight:  120.4 kg    Height:       Physical Exam General:well appearing female in NAD Heart:RRR< no mrg Lungs:CTAB, nml WOB on RA Abdomen:soft, NTND Extremities:LLE wrapped, no edema b/l Dialysis Access: LU AVF +b/t   Filed Weights   02/08/22 0600 02/09/22 1425 02/09/22 1855  Weight: 117.9 kg 122.4 kg 120.4 kg    Intake/Output Summary (Last 24 hours) at 02/10/2022 0947 Last data filed at 02/09/2022 1855 Gross per 24 hour  Intake --  Output 3000 ml  Net -3000 ml    Additional Objective Labs: Basic Metabolic Panel: Recent Labs  Lab 02/08/22 0620 02/09/22 1519  NA 141 135  K 3.9 4.1  CL 96* 94*  CO2  --  27  GLUCOSE 68* 152*  BUN 36* 52*  CREATININE 5.50* 6.45*  CALCIUM  --  9.2  PHOS  --  5.9*   Liver Function Tests: Recent Labs  Lab 02/09/22 1519  ALBUMIN 3.3*   CBC: Recent Labs  Lab 02/08/22 0620 02/09/22 1519  WBC  --  8.4  HGB 10.5* 10.1*  HCT 31.0* 31.3*  MCV  --  98.1  PLT  --  197   Medications:  sodium chloride Stopped (02/08/22 1300)   methocarbamol (ROBAXIN) IV      allopurinol  100 mg Oral Daily   aspirin EC  325 mg Oral Q breakfast   atorvastatin  20 mg Oral Daily   buPROPion  150 mg Oral BID   carvedilol  25 mg Oral BID WC   Chlorhexidine Gluconate Cloth  6 each Topical Q0600   docusate sodium  100 mg Oral BID   escitalopram  10 mg Oral Daily   fenofibrate  160 mg Oral Daily   ferric citrate  420 mg Oral TID WC   furosemide  80 mg Oral  Once per day on Sun Mon Wed Fri   insulin aspart  0-15 Units Subcutaneous TID WC   insulin aspart  4 Units Subcutaneous TID WC   isosorbide mononitrate  30 mg Oral Daily   linaclotide  145 mcg Oral QAC breakfast   sevelamer carbonate  1,600 mg Oral TID WC    Dialysis Orders: [facility]  4hrs, BFR 350, DFR 600,  EDW 122.6kg, 2K/ 2.5Ca   Access: LU AVF - 16G needles  Heparin 3200 Hectorol 49mg IV qHD    Assessment/Plan:  Osteoarthritis - s/p L TKR 02/09/22.   ESRD -  On TTS.  Continue per regular schedule. Next HD 1/23  Hypertension/volume  - BP well controlled. Does not appear volume overloaded.  UF as tolerated.   Anemia of CKD - hgb 10.1. Not currently on ESA, follow labs.   Secondary Hyperparathyroidism -  Check Ca, phos mildly elevated.  Continue VDRA and binders - auryxia 2 AC.   Nutrition - Renal diet w/fluid restrictions.  DMT2 - per  primary Dispo - plan for SNF placement  Jen Mow, PA-C [facility] 02/10/2022,9:47 AM  LOS: 1 day

## 2022-02-10 NOTE — Progress Notes (Signed)
[id number] A   This CSW has obtained this patient's PASRR

## 2022-02-10 NOTE — Progress Notes (Signed)
Patient ID: RYE DORADO, female   DOB: 29-Feb-1955, 67 y.o.   MRN: [id number] Continues to work with physical therapy with limited mobility.  Seen and evaluated by physical therapy that recommend nursing home.  At this time case management has been working on placement.  Denies any changes in the left leg.  Pain is well-controlled.  Compressive dressing applied which is clean dry intact.  Neurovascularly intact.  Plan for discharge to SNF when bed becomes available.

## 2022-02-10 NOTE — Progress Notes (Signed)
Physical Therapy Treatment Patient Details Name: Chelsey Lee MRN: [id number] DOB: Sep 01, 1955 Today's Date: 02/10/2022   History of Present Illness Pt is 67 yo female s/p L TKA on 02/08/22.  Pt with hx including but not limited to gout, ESRD on HD, GERD, HLD, obesity, OSA, DM2, spondylolisthesis, spinal stenosis, celiac disease, HTN, anemia, arthritis    PT Comments    Pt is progressing towards goals slowly.  Pt was Mod A for sit to stand from elevated EOB. Pt was able to progress gait taking steps fwd/back at [place] with RW this session. Due to pt current functional status, level of physical assist needed and home set up pt will require heavy assistance on discharge from acute care hospital setting. Recommend continued skilled physical therapy services on discharge from acute care hospital setting.   Recommendations for follow up therapy are one component of a multi-disciplinary discharge planning process, led by the attending physician.  Recommendations may be updated based on patient status, additional functional criteria and insurance authorization.  Follow Up Recommendations  Follow physician's recommendations for discharge plan and follow up therapies     Assistance Recommended at Discharge Frequent or constant Supervision/Assistance  Patient can return home with the following A lot of help with walking and/or transfers;A lot of help with bathing/dressing/bathroom;Assistance with cooking/housework;Help with stairs or ramp for entrance   Equipment Recommendations  Rolling walker (2 wheels)    Recommendations for Other Services       Precautions / Restrictions Precautions Precautions: Fall Restrictions Weight Bearing Restrictions: Yes LLE Weight Bearing: Weight bearing as tolerated     Mobility  Bed Mobility Overal bed mobility: Needs Assistance Bed Mobility: Supine to Sit, Sit to Supine     Supine to sit: Min guard Sit to supine: Min assist   General bed mobility  comments: extra time for LLE for supine to sitting, Min A for LLE for sitting to supine. Patient Response: Cooperative  Transfers Overall transfer level: Needs assistance Equipment used: Rolling walker (2 wheels) Transfers: Sit to/from Stand Sit to Stand: Mod assist, From elevated surface   Step pivot transfers: Mod assist       General transfer comment: Mod A with extra time to extend trunk from sitting, 1 attempt followed by success with extra time, Mostly Mod a to stabilize walker while pt pulls up and to keep from rolling anteriorly.    Ambulation/Gait Ambulation/Gait assistance: Min assist Gait Distance (Feet): 3 Feet Assistive device: Rolling walker (2 wheels) Gait Pattern/deviations: Decreased step length - left, Decreased stance time - right, Decreased step length - right, Antalgic   Gait velocity interpretation: <1.31 ft/sec, indicative of household ambulator   General Gait Details: Pt ambulated fwd/back then side to side  at EOB with Min A assist managing AD and hands on gait belt for safety. Verbal cues for sequencing to help decrease pain.       Balance Overall balance assessment: Needs assistance Sitting-balance support: No upper extremity supported Sitting balance-Leahy Scale: Good     Standing balance support: Bilateral upper extremity supported, Reliant on assistive device for balance Standing balance-Leahy Scale: Poor Standing balance comment: poor initially going from sitting to standing, improves in standing to Fair. Pt able to stand with unilateral UE support.            Cognition Arousal/Alertness: Awake/alert Behavior During Therapy: WFL for tasks assessed/performed Overall Cognitive Status: Within Functional Limits for tasks assessed         General Comments: Continued to  encourage breathing techniques especially with standing to sitting        Exercises Total Joint Exercises Ankle Circles/Pumps: Right, Left, 5 reps Long Arc Quad:  Right, Left, 10 reps Knee Flexion: AROM, Right, Left, 10 reps Marching in Standing: AROM, Right, Left, 5 reps (sitting)    General Comments General comments (skin integrity, edema, etc.): pt continues with significant pain from going from sitting<>standing with knee flexion with increased difficulty getting trunk into extension and heavy reliance on AD to perform this movement.      Pertinent Vitals/Pain Pain Assessment Pain Assessment: 0-10 Pain Score: 7  Pain Location: L knee Pain Descriptors / Indicators: Sharp Pain Intervention(s): Premedicated before session, Limited activity within patient's tolerance, Monitored during session     PT Goals (current goals can now be found in the care plan section) Acute Rehab PT Goals Patient Stated Goal: return home; decrease pain PT Goal Formulation: With patient Time For Goal Achievement: 02/22/22 Potential to Achieve Goals: Good Progress towards PT goals: Progressing toward goals    Frequency    7X/week      PT Plan Current plan remains appropriate       AM-PAC PT "6 Clicks" Mobility   Outcome Measure  Help needed turning from your back to your side while in a flat bed without using bedrails?: A Little Help needed moving from lying on your back to sitting on the side of a flat bed without using bedrails?: A Little Help needed moving to and from a bed to a chair (including a wheelchair)?: A Lot Help needed standing up from a chair using your arms (e.g., wheelchair or bedside chair)?: A Lot Help needed to walk in hospital room?: Total Help needed climbing 3-5 steps with a railing? : Total 6 Click Score: 12    End of Session Equipment Utilized During Treatment: Gait belt Activity Tolerance: Patient tolerated treatment well;Patient limited by pain Patient left: in bed;with call bell/phone within reach;with nursing/sitter in room Nurse Communication: Mobility status PT Visit Diagnosis: Other abnormalities of gait and mobility  (R26.89);Muscle weakness (generalized) (M62.81);Pain Pain - Right/Left: Left Pain - part of body: Knee     Time: 1611-1630 PT Time Calculation (min) (ACUTE ONLY): 19 min  Charges:  $Gait Training: 8-22 mins $Therapeutic Activity: 38-52 mins                    Tomma Rakers, DPT, CLT  Acute Rehabilitation Services Office: [phone] (Secure chat preferred)     Ander Purpura 02/10/2022, 4:43 PM

## 2022-02-10 NOTE — Progress Notes (Signed)
Physical Therapy Treatment Patient Details Name: Chelsey Lee MRN: [id number] DOB: 07/05/1955 Today's Date: 02/10/2022   History of Present Illness Pt is 67 yo female s/p L TKA on 02/08/22.  Pt with hx including but not limited to gout, ESRD on HD, GERD, HLD, obesity, OSA, DM2, spondylolisthesis, spinal stenosis, celiac disease, HTN, anemia, arthritis    PT Comments    Pt is progressing towards goals slowly. Attempted EOB there-ex prior to functional mobility to decrease stiffness in the L knee.  Pt was Max A for sit to stand from elevated EOB progressing to Mod A. Pt was able to take a few small steps at [place] with RW this session. Due to pt current functional status, level of physical assist needed and home set up pt will require heavy assistance on discharge from acute care hospital setting. Recommend continued skilled physical therapy services on discharge from acute care hospital setting.     Recommendations for follow up therapy are one component of a multi-disciplinary discharge planning process, led by the attending physician.  Recommendations may be updated based on patient status, additional functional criteria and insurance authorization.  Follow Up Recommendations  Follow physician's recommendations for discharge plan and follow up therapies     Assistance Recommended at Discharge Frequent or constant Supervision/Assistance  Patient can return home with the following A lot of help with walking and/or transfers;A lot of help with bathing/dressing/bathroom;Assistance with cooking/housework;Help with stairs or ramp for entrance   Equipment Recommendations  Rolling walker (2 wheels)    Recommendations for Other Services       Precautions / Restrictions Precautions Precautions: Fall Restrictions Weight Bearing Restrictions: Yes LLE Weight Bearing: Weight bearing as tolerated     Mobility  Bed Mobility Overal bed mobility: Needs Assistance Bed Mobility: Supine to Sit,  Sit to Supine     Supine to sit: Min guard Sit to supine: Min assist, HOB elevated   General bed mobility comments: extra time for LLE for supine to sitting, Min A for LLE for sitting to supine. Patient Response: Cooperative  Transfers Overall transfer level: Needs assistance   Transfers: Sit to/from Stand, Bed to chair/wheelchair/BSC Sit to Stand: Max assist   Step pivot transfers: Mod assist       General transfer comment: Pt was Max A initially unsuccessful for 2 stands; pt was able to clear buttocks from EOB but unable to straighten trunk. Then with elevated EOB and blocking RW pt with extra time to extend trunk was able to stand EOB. Pt was then able to stand again at Mod A +1 and then Mod A +1 from [facility]    Ambulation/Gait Ambulation/Gait assistance: Min assist Gait Distance (Feet): 2 Feet Assistive device: Rolling walker (2 wheels) Gait Pattern/deviations: Decreased step length - left, Decreased stance time - right   Gait velocity interpretation: <1.31 ft/sec, indicative of household ambulator   General Gait Details: small steps with low foot clearance, verbal cues initially for wgt shifting and sequencing; pt improved from BSC to EOB. Pt then took side steps at EOB without cueing at [facility] A for balance and safety. Pt able to WB through the LLE this session.        Balance Overall balance assessment: Needs assistance Sitting-balance support: No upper extremity supported Sitting balance-Leahy Scale: Good     Standing balance support: Bilateral upper extremity supported, Reliant on assistive device for balance Standing balance-Leahy Scale: Poor Standing balance comment: poor initially going from sitting to standing, improves in standing  to [place]. Pt able to stand with unilateral UE support.        Cognition Arousal/Alertness: Awake/alert Behavior During Therapy: WFL for tasks assessed/performed Overall Cognitive Status: Within Functional Limits for tasks assessed           Exercises Total Joint Exercises Ankle Circles/Pumps: Right, Left, 5 reps Long Arc Quad: Right, Left, 10 reps Knee Flexion: AROM, Right, Left, 10 reps Marching in Standing: AROM, Right, Left, 5 reps (sitting)    General Comments General comments (skin integrity, edema, etc.): Pt continues with significant pain for going from sitting<>standing with knee flexion with increased difficulty getting trunk into extension and heavy reliance on AD to perform this movement.      Pertinent Vitals/Pain Pain Assessment Pain Assessment: 0-10 Pain Score: 9  Pain Location: L knee Pain Descriptors / Indicators: Sharp Pain Intervention(s): Premedicated before session, Monitored during session, Limited activity within patient's tolerance     PT Goals (current goals can now be found in the care plan section) Acute Rehab PT Goals Patient Stated Goal: return home; decrease pain PT Goal Formulation: With patient Time For Goal Achievement: 02/22/22 Potential to Achieve Goals: Good Progress towards PT goals: Progressing toward goals    Frequency    7X/week      PT Plan Current plan remains appropriate       AM-PAC PT "6 Clicks" Mobility   Outcome Measure  Help needed turning from your back to your side while in a flat bed without using bedrails?: A Little Help needed moving from lying on your back to sitting on the side of a flat bed without using bedrails?: A Little Help needed moving to and from a bed to a chair (including a wheelchair)?: A Lot Help needed standing up from a chair using your arms (e.g., wheelchair or bedside chair)?: A Lot Help needed to walk in hospital room?: Total Help needed climbing 3-5 steps with a railing? : Total 6 Click Score: 12    End of Session Equipment Utilized During Treatment: Gait belt Activity Tolerance: Patient tolerated treatment well;Patient limited by pain Patient left: in bed;with call bell/phone within reach;with bed alarm set;with  nursing/sitter in room Nurse Communication: Mobility status PT Visit Diagnosis: Other abnormalities of gait and mobility (R26.89);Muscle weakness (generalized) (M62.81);Pain Pain - Right/Left: Left Pain - part of body: Knee     Time: 8416-6063 PT Time Calculation (min) (ACUTE ONLY): 40 min  Charges:  $Therapeutic Activity: 38-52 mins                    Tomma Rakers, DPT, CLT  Acute Rehabilitation Services Office: [phone] (Secure chat preferred)    Ander Purpura 02/10/2022, 2:51 PM

## 2022-02-11 LAB — GLUCOSE, CAPILLARY
Glucose-Capillary: 135 mg/dL — ABNORMAL HIGH (ref 70–99)
Glucose-Capillary: 200 mg/dL — ABNORMAL HIGH (ref 70–99)
Glucose-Capillary: 202 mg/dL — ABNORMAL HIGH (ref 70–99)
Glucose-Capillary: 178 mg/dL — ABNORMAL HIGH (ref 70–99)
Glucose-Capillary: 185 mg/dL — ABNORMAL HIGH (ref 70–99)
Glucose-Capillary: 189 mg/dL — ABNORMAL HIGH (ref 70–99)

## 2022-02-11 NOTE — Progress Notes (Signed)
Physical Therapy Treatment Patient Details Name: Chelsey Lee MRN: [id number] DOB: 1955-06-08 Today's Date: 02/11/2022   History of Present Illness Pt is 67 yo female s/p L TKA on 02/08/22.  Pt with hx including but not limited to gout, ESRD on HD, GERD, HLD, obesity, OSA, DM2, spondylolisthesis, spinal stenosis, celiac disease, HTN, anemia, arthritis    PT Comments    Pt continues to slowly progress towards goals. Pt was able to increase gait distance this session but continues to be limited by pain/weakness. Pt initially was Max A and unable to successfully stand followed by successful Mod A sit to stand from EOB. Due to pt current functional status, available assistance at home and home set up recommending skilled physical therapy services at higher level of care on discharge from acute care hospital setting in order to decrease risk for falls, injury and re-hospitalization.     Recommendations for follow up therapy are one component of a multi-disciplinary discharge planning process, led by the attending physician.  Recommendations may be updated based on patient status, additional functional criteria and insurance authorization.  Follow Up Recommendations  Follow physician's recommendations for discharge plan and follow up therapies     Assistance Recommended at Discharge Frequent or constant Supervision/Assistance  Patient can return home with the following A lot of help with walking and/or transfers;A lot of help with bathing/dressing/bathroom;Assistance with cooking/housework;Help with stairs or ramp for entrance   Equipment Recommendations  Rolling walker (2 wheels)    Recommendations for Other Services       Precautions / Restrictions Precautions Precautions: Fall Restrictions Weight Bearing Restrictions: Yes LLE Weight Bearing: Weight bearing as tolerated     Mobility  Bed Mobility Overal bed mobility: Needs Assistance Bed Mobility: Supine to Sit     Supine to  sit: Min assist     General bed mobility comments: Min A for LLE for sitting left in recliner at end of session Patient Response: Cooperative  Transfers Overall transfer level: Needs assistance Equipment used: Rolling walker (2 wheels) Transfers: Sit to/from Stand Sit to Stand: Max assist, Mod assist           General transfer comment: Max A initially for unsuccessful stand from elevated EOB. Pt was unable to activate glute and push up to full standing, Mod a for second attempt with extra time to extend trunk    Ambulation/Gait Ambulation/Gait assistance: Min assist Gait Distance (Feet): 6 Feet Assistive device: Rolling walker (2 wheels) Gait Pattern/deviations: Decreased step length - left, Decreased stance time - right, Decreased step length - right, Antalgic Gait velocity: decreased cadence Gait velocity interpretation: <1.31 ft/sec, indicative of household ambulator   General Gait Details: Pt ambulated fwd with Min A assist managing AD and hands on gait belt for safety. Verbal cues for sequencing to help decrease pain initially with good re-call. Minimal L knee flexion with foot clearance.        Balance Overall balance assessment: Needs assistance Sitting-balance support: No upper extremity supported Sitting balance-Leahy Scale: Good     Standing balance support: Bilateral upper extremity supported, Reliant on assistive device for balance Standing balance-Leahy Scale: Poor Standing balance comment: poor initially going from sitting to standing, improves in standing to Fair. Pt able to stand with unilateral UE support.        Cognition Arousal/Alertness: Awake/alert Behavior During Therapy: WFL for tasks assessed/performed Overall Cognitive Status: Within Functional Limits for tasks assessed   General Comments: Continued to encourage breathing techniques especially with standing  to sitting               Pertinent Vitals/Pain Pain Assessment Pain  Assessment: 0-10 Pain Score: 9  Pain Location: L knee Pain Descriptors / Indicators: Sharp Pain Intervention(s): RN gave pain meds during session, Monitored during session, Limited activity within patient's tolerance     PT Goals (current goals can now be found in the care plan section) Acute Rehab PT Goals Patient Stated Goal: return home; decrease pain PT Goal Formulation: With patient Time For Goal Achievement: 02/22/22 Potential to Achieve Goals: Good Progress towards PT goals: Progressing toward goals    Frequency    7X/week      PT Plan Current plan remains appropriate       AM-PAC PT "6 Clicks" Mobility   Outcome Measure  Help needed turning from your back to your side while in a flat bed without using bedrails?: A Little Help needed moving from lying on your back to sitting on the side of a flat bed without using bedrails?: A Little Help needed moving to and from a bed to a chair (including a wheelchair)?: A Lot Help needed standing up from a chair using your arms (e.g., wheelchair or bedside chair)?: A Lot Help needed to walk in hospital room?: Total Help needed climbing 3-5 steps with a railing? : Total 6 Click Score: 12    End of Session Equipment Utilized During Treatment: Gait belt Activity Tolerance: Patient tolerated treatment well;Patient limited by pain Patient left: with call bell/phone within reach;in chair;with chair alarm set Nurse Communication: Mobility status PT Visit Diagnosis: Other abnormalities of gait and mobility (R26.89);Muscle weakness (generalized) (M62.81);Pain Pain - Right/Left: Left Pain - part of body: Knee     Time: 1230-1258 PT Time Calculation (min) (ACUTE ONLY): 28 min  Charges:  $Gait Training: 8-22 mins $Therapeutic Activity: 8-22 mins                     Tomma Rakers, DPT, CLT  Acute Rehabilitation Services Office: [phone] (Secure chat preferred)    Ander Purpura 02/11/2022, 1:03 PM

## 2022-02-11 NOTE — TOC Progression Note (Signed)
Transition of Care [facility]) - Progression Note    Patient Details  Name: Chelsey Lee MRN: [id number] Date of Birth: 1955-07-14  [facility] CM/SW Contact  Joanne Chars, LCSW Phone Number: 02/11/2022, 11:01 AM  Clinical Narrative:   CSW spoke with pt and sister Olga Coaster, presented bed offers, they do want to accept offer at [facility]. ([facility] not able to offer due to HD, was first choice).  Confirmed with [facility].    Expected Discharge Plan: [place] Barriers to Discharge: Continued Medical Work up  Expected Discharge Plan and Services   Discharge Planning Services: CM Consult                     DME Arranged: Gilford Rile rolling DME Agency: [employer] Date DME Agency Contacted: 02/09/22 Time DME Agency Contacted: [phone] Representative spoke with at DME Agency: Dennard Schaumann Arranged: PT [place]: [place] Date [place]: 02/09/22 Time [place]: [phone] Representative spoke with at [place]: [place] Determinants of Health ([place]) Interventions SDOH Screenings   Food Insecurity: No Food Insecurity (02/08/2022)  Housing: Low Risk  (02/08/2022)  Transportation Needs: No Transportation Needs (02/08/2022)  Utilities: Not At Risk (02/08/2022)  Depression (PHQ2-9): Low Risk  (11/08/2019)  Tobacco Use: Low Risk  (02/08/2022)    Readmission Risk Interventions     No data to display

## 2022-02-11 NOTE — Progress Notes (Signed)
Patient's sister Dyke Maes called again this evening.  This time to speak with nurse caring for her sister.  Gave message to nurse to call her back when available.

## 2022-02-11 NOTE — Progress Notes (Signed)
[facility] Progress Note   Subjective:   Seen in room. Leg feels a little better today. Wants to try to stand.    Objective Vitals:   02/10/22 2341 02/11/22 0600 02/11/22 0811 02/11/22 0926  BP: (!) 105/45 (!) 117/56 134/64 (!) 120/44  Pulse: 77 75 75 76  Resp: '18 16 18 18  '$ Temp: 99.1 F (37.3 C) 98.7 F (37.1 C) 98.3 F (36.8 C) (!) 97.4 F (36.3 C)  TempSrc: Oral Oral Oral Oral  SpO2: 97% 97% 91% 97%  Weight:      Height:       Physical Exam General: well appearing female in NAD Heart: RRR< no mrg Lungs: CTAB, nml WOB on RA Abdomen: soft, NTND Extremities: LLE wrapped, no edema b/l Dialysis Access: LU AVF +b/t   Filed Weights   02/08/22 0600 02/09/22 1425 02/09/22 1855  Weight: 117.9 kg 122.4 kg 120.4 kg    Intake/Output Summary (Last 24 hours) at 02/11/2022 1139 Last data filed at 02/10/2022 2000 Gross per 24 hour  Intake 360 ml  Output --  Net 360 ml     Additional Objective Labs: Basic Metabolic Panel: Recent Labs  Lab 02/08/22 0620 02/09/22 1519  NA 141 135  K 3.9 4.1  CL 96* 94*  CO2  --  27  GLUCOSE 68* 152*  BUN 36* 52*  CREATININE 5.50* 6.45*  CALCIUM  --  9.2  PHOS  --  5.9*    Liver Function Tests: Recent Labs  Lab 02/09/22 1519  ALBUMIN 3.3*    CBC: Recent Labs  Lab 02/08/22 0620 02/09/22 1519  WBC  --  8.4  HGB 10.5* 10.1*  HCT 31.0* 31.3*  MCV  --  98.1  PLT  --  197    Medications:  sodium chloride Stopped (02/08/22 1300)   methocarbamol (ROBAXIN) IV      allopurinol  100 mg Oral Daily   aspirin EC  325 mg Oral Q breakfast   atorvastatin  20 mg Oral Daily   buPROPion  150 mg Oral BID   carvedilol  25 mg Oral BID WC   Chlorhexidine Gluconate Cloth  6 each Topical Q0600   docusate sodium  100 mg Oral BID   escitalopram  10 mg Oral Daily   fenofibrate  160 mg Oral Daily   ferric citrate  420 mg Oral TID WC   furosemide  80 mg Oral Once per day on Sun Mon Wed Fri   insulin aspart  0-15 Units  Subcutaneous TID WC   insulin aspart  4 Units Subcutaneous TID WC   isosorbide mononitrate  30 mg Oral Daily   linaclotide  145 mcg Oral QAC breakfast   sevelamer carbonate  1,600 mg Oral TID WC    Dialysis Orders: [facility]  4hrs, BFR 350, DFR 600,  EDW 122.6kg, 2K/ 2.5Ca   Access: LU AVF - 16G needles  Heparin 3200 Hectorol 70mg IV qHD    Assessment/Plan:  Osteoarthritis - s/p L TKR 02/09/22.   ESRD -  On TTS.  Continue per regular schedule. Next HD 1/23  Hypertension/volume  - BP well controlled. Does not appear volume overloaded.  UF as tolerated.   Anemia of CKD - hgb 10.1. Not currently on ESA, follow labs.   Secondary Hyperparathyroidism -  Ca acceptable, phos mildly elevated.  Continue VDRA and binders - auryxia 2 AC.   Nutrition - Renal diet w/fluid restrictions.  DMT2 - per primary Dispo - plan for SNF  placement  Lynnda Child PA-C [facility] 02/11/2022,11:40 AM

## 2022-02-11 NOTE — Progress Notes (Signed)
Patient's POA and sister Dyke Maes [phone] called and needed exact time her sister will be going to HD tomorrow.    Charge nurse called HD at [phone] and was informed that they will not know exact time until morning once they know acuity of each patient.  [name] is extremely irate because of previous incidents that transpired over this past weekend involving her sister's care.  She would like her sister's nurses to please make sure at night that she is wearing her CPAP and when patient is at HD she is being fed and that her sister get a snack and night so her glucose don't drop too low. This information has been given to night shift nurse and note placed for any oncoming staff.

## 2022-02-11 NOTE — Progress Notes (Signed)
Pt receives out-pt HD at [facility] on TTS. Pt arrives at 11:15 for 11:25 chair time. This info was provided to CSW for snf placement purposes. Will assist as needed.   Melven Sartorius Renal Navigator [phone]

## 2022-02-11 NOTE — Progress Notes (Signed)
Patient ID: Chelsey Lee, female   DOB: 13-Dec-1955, 67 y.o.   MRN: [id number] Patient is making slow improvement with therapy however will require discharge to skilled nursing.  The dressing is clean and dry.  Plan for discharge when a skilled nursing bed is available.

## 2022-02-12 ENCOUNTER — Encounter (HOSPITAL_COMMUNITY): Payer: Self-pay | Admitting: Orthopedic Surgery

## 2022-02-12 LAB — RENAL FUNCTION PANEL
Albumin: 2.8 g/dL — ABNORMAL LOW (ref 3.5–5.0)
Anion gap: 13 (ref 5–15)
BUN: 75 mg/dL — ABNORMAL HIGH (ref 8–23)
CO2: 27 mmol/L (ref 22–32)
Calcium: 9.2 mg/dL (ref 8.9–10.3)
Chloride: 90 mmol/L — ABNORMAL LOW (ref 98–111)
Creatinine, Ser: 8.49 mg/dL — ABNORMAL HIGH (ref 0.44–1.00)
GFR, Estimated: 5 mL/min — ABNORMAL LOW (ref >60)
Glucose, Bld: 154 mg/dL — ABNORMAL HIGH (ref 70–99)
Phosphorus: 5.3 mg/dL — ABNORMAL HIGH (ref 2.5–4.6)
Potassium: 4.3 mmol/L (ref 3.5–5.1)
Sodium: 130 mmol/L — ABNORMAL LOW (ref 135–145)

## 2022-02-12 LAB — GLUCOSE, CAPILLARY
Glucose-Capillary: 155 mg/dL — ABNORMAL HIGH (ref 70–99)
Glucose-Capillary: 167 mg/dL — ABNORMAL HIGH (ref 70–99)
Glucose-Capillary: 120 mg/dL — ABNORMAL HIGH (ref 70–99)
Glucose-Capillary: 187 mg/dL — ABNORMAL HIGH (ref 70–99)
Glucose-Capillary: 188 mg/dL — ABNORMAL HIGH (ref 70–99)

## 2022-02-12 LAB — CBC
HCT: 28 % — ABNORMAL LOW (ref 36.0–46.0)
Hemoglobin: 9.6 g/dL — ABNORMAL LOW (ref 12.0–15.0)
MCH: 32.4 pg (ref 26.0–34.0)
MCHC: 34.3 g/dL (ref 30.0–36.0)
MCV: 94.6 fL (ref 80.0–100.0)
Platelets: 196 10*3/uL (ref 150–400)
RBC: 2.96 MIL/uL — ABNORMAL LOW (ref 3.87–5.11)
RDW: 13.4 % (ref 11.5–15.5)
WBC: 8.3 10*3/uL (ref 4.0–10.5)
nRBC: 0 % (ref 0.0–0.2)

## 2022-02-12 MED ORDER — PENTAFLUOROPROP-TETRAFLUOROETH EX AERO: 1.0000 | INHALATION_SPRAY | CUTANEOUS | Status: DC | PRN

## 2022-02-12 MED ORDER — HEPARIN SODIUM (PORCINE) 1000 UNIT/ML DIALYSIS: 1000.0000 [IU] | INTRAMUSCULAR | Status: DC | PRN

## 2022-02-12 MED ORDER — LIDOCAINE HCL (PF) 1 % IJ SOLN: 5.0000 mL | INTRAMUSCULAR | Status: DC | PRN

## 2022-02-12 MED ORDER — ALTEPLASE 2 MG IJ SOLR: 2.0000 mg | Freq: Once | INTRAMUSCULAR | Status: DC | PRN

## 2022-02-12 MED ORDER — LIDOCAINE-PRILOCAINE 2.5-2.5 % EX CREA: 1.0000 | TOPICAL_CREAM | CUTANEOUS | Status: DC | PRN

## 2022-02-12 MED ORDER — HEPARIN SODIUM (PORCINE) 1000 UNIT/ML DIALYSIS: 3000.0000 [IU] | INTRAMUSCULAR | Status: DC | PRN

## 2022-02-12 MED ORDER — ANTICOAGULANT SODIUM CITRATE 4% (200MG/5ML) IV SOLN: 5.0000 mL | Status: DC | PRN

## 2022-02-12 MED ORDER — HEPARIN SODIUM (PORCINE) 1000 UNIT/ML IJ SOLN
INTRAMUSCULAR | Status: AC
  Administered 2022-02-12: 3000 [IU]
  Filled 2022-02-12: qty 3

## 2022-02-12 NOTE — Progress Notes (Cosign Needed)
Received patient in bed.Awake,alert and oriented x 3.Vitals stable.  Access used. Left upper arm fistula that works well.  Duration of treatment : 4 hours  Medicine given : Heparin 2,000 units bolus.  Fluid removed : 2,000 cc.  Hemodialysis tx issue : None.  Hand off to the patient's nurse.

## 2022-02-12 NOTE — Progress Notes (Signed)
[facility] Progress Note   Subjective:   Seen in [place]. UF goal 2L. Tolerating so far. Did get up in chair yesterday. No new concerns. Denies cp, dyspnea.    Objective Vitals:   02/11/22 2139 02/12/22 0728 02/12/22 0805 02/12/22 0845  BP: (!) 110/45 (!) 129/47 (!) 125/45 (!) 125/48  Pulse: 69 70 71 71  Resp: 18 17 (!) 23 19  Temp: 97.9 F (36.6 C) 98.4 F (36.9 C) 98.7 F (37.1 C)   TempSrc: Oral Oral    SpO2: (!) 89% 93% 93% 96%  Weight:   122 kg   Height:       Physical Exam General: well appearing female in NAD Heart: RRR< no mrg Lungs: CTAB, nml WOB on RA Abdomen: soft, NTND Extremities: LLE wrapped, no edema b/l Dialysis Access: LU AVF +b/t   Filed Weights   02/09/22 1425 02/09/22 1855 02/12/22 0805  Weight: 122.4 kg 120.4 kg 122 kg   No intake or output data in the 24 hours ending 02/12/22 0906   Additional Objective Labs: Basic Metabolic Panel: Recent Labs  Lab 02/08/22 0620 02/09/22 1519 02/12/22 0248  NA 141 135 130*  K 3.9 4.1 4.3  CL 96* 94* 90*  CO2  --  27 27  GLUCOSE 68* 152* 154*  BUN 36* 52* 75*  CREATININE 5.50* 6.45* 8.49*  CALCIUM  --  9.2 9.2  PHOS  --  5.9* 5.3*    Liver Function Tests: Recent Labs  Lab 02/09/22 1519 02/12/22 0248  ALBUMIN 3.3* 2.8*    CBC: Recent Labs  Lab 02/08/22 0620 02/09/22 1519 02/12/22 0248  WBC  --  8.4 8.3  HGB 10.5* 10.1* 9.6*  HCT 31.0* 31.3* 28.0*  MCV  --  98.1 94.6  PLT  --  197 196    Medications:  sodium chloride Stopped (02/08/22 1300)   anticoagulant sodium citrate     anticoagulant sodium citrate     methocarbamol (ROBAXIN) IV      allopurinol  100 mg Oral Daily   aspirin EC  325 mg Oral Q breakfast   atorvastatin  20 mg Oral Daily   buPROPion  150 mg Oral BID   carvedilol  25 mg Oral BID WC   Chlorhexidine Gluconate Cloth  6 each Topical Q0600   docusate sodium  100 mg Oral BID   escitalopram  10 mg Oral Daily   fenofibrate  160 mg Oral Daily   ferric  citrate  420 mg Oral TID WC   furosemide  80 mg Oral Once per day on Sun Mon Wed Fri   insulin aspart  0-15 Units Subcutaneous TID WC   insulin aspart  4 Units Subcutaneous TID WC   isosorbide mononitrate  30 mg Oral Daily   linaclotide  145 mcg Oral QAC breakfast   sevelamer carbonate  1,600 mg Oral TID WC    Dialysis Orders: [facility]  4hrs, BFR 350, DFR 600,  EDW 122.6kg, 2K/ 2.5Ca   Access: LU AVF - 16G needles  Heparin 3200 Hectorol 61mg IV qHD    Assessment/Plan:  Osteoarthritis - s/p L TKR 02/09/22.   ESRD -  On TTS.  Continue per regular schedule. Next HD 1/23  Hypertension/volume  - BP well controlled. Does not appear volume overloaded.  UF as tolerated. Is at dry weight.    Anemia of CKD - hgb 9.6.  Not currently on ESA, follow labs.   Secondary Hyperparathyroidism -  Ca acceptable, phos mildly  elevated.  Continue VDRA and binders - auryxia 2 AC.   Nutrition - Renal diet w/fluid restrictions.  DMT2 - per primary Dispo - plan for SNF placement  Raymond Bhardwaj Larina Earthly PA-C [facility] 02/12/2022,9:06 AM

## 2022-02-12 NOTE — Progress Notes (Signed)
Physical Therapy Treatment Patient Details Name: Chelsey Lee MRN: [id number] DOB: Dec 26, 1955 Today's Date: 02/12/2022   History of Present Illness Pt is 67 yo female s/p L TKA on 02/08/22.  Pt with hx including but not limited to gout, ESRD on HD, GERD, HLD, obesity, OSA, DM2, spondylolisthesis, spinal stenosis, celiac disease, HTN, anemia, arthritis.    PT Comments    Pt received in supine, agreeable to therapy session with encouragement, pt reports she forgot to order dinner and defers OOB as she recently was assisted to [facility] and is fatigued from HD in AM. Pt agreeable to bed-level session with emphasis on LLE exercises per HEP handout (copy given to pt for carryover) and pt repositioned into chair posture in bed in anticipation of dinner meal. Pt continues to benefit from PT services to progress toward functional mobility goals.    Recommendations for follow up therapy are one component of a multi-disciplinary discharge planning process, led by the attending physician.  Recommendations may be updated based on patient status, additional functional criteria and insurance authorization.  Follow Up Recommendations  Follow physician's recommendations for discharge plan and follow up therapies     Assistance Recommended at Discharge Frequent or constant Supervision/Assistance  Patient can return home with the following A lot of help with walking and/or transfers;A lot of help with bathing/dressing/bathroom;Assistance with cooking/housework;Help with stairs or ramp for entrance   Equipment Recommendations  Rolling walker (2 wheels)    Recommendations for Other Services       Precautions / Restrictions Precautions Precautions: Fall;Knee Precaution Booklet Issued: Yes (comment) Precaution Comments: pt reports awareness of no bending prec. Restrictions Weight Bearing Restrictions: Yes LLE Weight Bearing: Weight bearing as tolerated     Mobility  Bed Mobility Overal bed mobility:  Needs Assistance Bed Mobility: Supine to Sit     Supine to sit: Min assist     General bed mobility comments: supine to long sitting in bed, pt defer OOB due to wanting to order dinner and pain score high    Transfers                   General transfer comment: pt defers due to pain after supine therex, wanting to order dinner         Cognition Arousal/Alertness: Awake/alert Behavior During Therapy: WFL for tasks assessed/performed Overall Cognitive Status: Within Functional Limits for tasks assessed                                 General Comments: Continued to encourage breathing techniques, pt reports she forgot to order dinner, needed assist to locate/ dial the number properly from her menu.        Exercises Total Joint Exercises Ankle Circles/Pumps: AROM, Both, 10 reps, Supine Quad Sets: AROM, AAROM, Both, 10 reps, Supine Towel Squeeze: AROM, Both, 5 reps, Supine Short Arc Quad: AROM, AAROM, Left, 10 reps, Supine Heel Slides: AROM, AAROM, Left, 10 reps, Supine Hip ABduction/ADduction: AROM, AAROM, Left, 10 reps, Supine Straight Leg Raises: AAROM, Left, Supine, 5 reps Long Arc Quad: AROM, AAROM, Left, 10 reps, Seated Goniometric ROM: L knee flexion grossly 5 deg to 45 deg in supine (pain/guarding/L knee compression wrap limiting)    General Comments General comments (skin integrity, edema, etc.): pt reports L knee wrappings over dressing feel very tight and this is exacerbating her pain, RN notified in case this can be done prior to MD seeing  her in AM.      Pertinent Vitals/Pain Pain Assessment Pain Assessment: Faces Faces Pain Scale: Hurts even more Pain Location: L knee, pt reports severe pain initially however symptoms do not line up with number given Pain Descriptors / Indicators: Sharp, Grimacing, Discomfort, Operative site guarding, Sore Pain Intervention(s): Monitored during session, Limited activity within patient's tolerance,  Premedicated before session, Repositioned, Patient requesting pain meds-RN notified, Ice applied, Other (comment) (per RN, pt not due for more pain meds yet)     PT Goals (current goals can now be found in the care plan section) Acute Rehab PT Goals Patient Stated Goal: return home; decrease pain PT Goal Formulation: With patient Time For Goal Achievement: 02/22/22 Progress towards PT goals: Progressing toward goals    Frequency    7X/week      PT Plan Current plan remains appropriate       AM-PAC PT "6 Clicks" Mobility   Outcome Measure  Help needed turning from your back to your side while in a flat bed without using bedrails?: A Little Help needed moving from lying on your back to sitting on the side of a flat bed without using bedrails?: A Lot Help needed moving to and from a bed to a chair (including a wheelchair)?: A Lot Help needed standing up from a chair using your arms (e.g., wheelchair or bedside chair)?: A Lot Help needed to walk in hospital room?: Total Help needed climbing 3-5 steps with a railing? : Total 6 Click Score: 11    End of Session Equipment Utilized During Treatment: Gait belt Activity Tolerance: Patient limited by fatigue;Patient limited by pain Patient left: in bed;with call bell/phone within reach;with bed alarm set;Other (comment) (bed in chair posture with legs flattened, HOB ~60*, pt ordering her dinner) Nurse Communication: Mobility status;Patient requests pain meds;Precautions;Other (comment) (c/o L knee dressing wrapped too tightly) PT Visit Diagnosis: Other abnormalities of gait and mobility (R26.89);Muscle weakness (generalized) (M62.81);Pain Pain - Right/Left: Left Pain - part of body: Knee     Time: 4010-2725 PT Time Calculation (min) (ACUTE ONLY): 26 min  Charges:  $Therapeutic Exercise: 23-37 mins                     Mylissa Lambe P., PTA Acute Rehabilitation Services Secure Chat Preferred 9a-5:30pm Office: [place] 02/12/2022, 6:44 PM

## 2022-02-12 NOTE — Progress Notes (Signed)
Patient ID: Chelsey Lee, female   DOB: May 01, 1955, 67 y.o.   MRN: [id number] Patient is status post left total knee arthroplasty.  She is making slow progress with therapy.  Plan for discharge to skilled nursing tomorrow.  I again reinforced the importance of knee extension exercises.

## 2022-02-13 ENCOUNTER — Other Ambulatory Visit: Payer: Self-pay

## 2022-02-13 ENCOUNTER — Telehealth: Payer: Self-pay | Admitting: Orthopedic Surgery

## 2022-02-13 ENCOUNTER — Telehealth: Payer: Self-pay

## 2022-02-13 LAB — GLUCOSE, CAPILLARY
Glucose-Capillary: 168 mg/dL — ABNORMAL HIGH (ref 70–99)
Glucose-Capillary: 141 mg/dL — ABNORMAL HIGH (ref 70–99)

## 2022-02-13 MED ORDER — OXYCODONE-ACETAMINOPHEN 5-325 MG PO TABS: 1.0000 | ORAL_TABLET | ORAL | 0 refills | Status: DC | PRN

## 2022-02-13 NOTE — Progress Notes (Signed)
Physical Therapy Treatment Patient Details Name: Chelsey Lee MRN: [id number] DOB: 1955/11/25 Today's Date: 02/13/2022   History of Present Illness Pt is 67 yo female s/p L TKA on 02/08/22.  Pt with hx including but not limited to gout, ESRD on HD, GERD, HLD, obesity, OSA, DM2, spondylolisthesis, spinal stenosis, celiac disease, HTN, anemia, arthritis.    PT Comments    Pt received in supine, agreeable to therapy session and with good participation and tolerance for bed mobility, exercise, transfer and gait instruction. Standing tolerance limited due to pt L knee pain and urinary urgency, after pivoting from bed<>BSC pt requesting to return to supine so L knee could be iced, RN also changed dressing while PTA present to assist with LLE elevation as pt has difficulty maintaining SLR unassisted. Pt continues to benefit from PT services to progress toward functional mobility goals.   Recommendations for follow up therapy are one component of a multi-disciplinary discharge planning process, led by the attending physician.  Recommendations may be updated based on patient status, additional functional criteria and insurance authorization.  Follow Up Recommendations  Follow physician's recommendations for discharge plan and follow up therapies     Assistance Recommended at Discharge Frequent or constant Supervision/Assistance  Patient can return home with the following A lot of help with walking and/or transfers;A lot of help with bathing/dressing/bathroom;Assistance with cooking/housework;Help with stairs or ramp for entrance   Equipment Recommendations  Rolling walker (2 wheels)    Recommendations for Other Services       Precautions / Restrictions Precautions Precautions: Fall;Knee Precaution Booklet Issued: Yes (comment) Precaution Comments: pt reports awareness of no bending prec. Restrictions Weight Bearing Restrictions: Yes LLE Weight Bearing: Weight bearing as tolerated      Mobility  Bed Mobility Overal bed mobility: Needs Assistance Bed Mobility: Supine to Sit     Supine to sit: Min assist, HOB elevated Sit to supine: Min assist   General bed mobility comments: increased time to perform due to pain, pt using gait belt on LLE as leg lifter with min cues    Transfers Overall transfer level: Needs assistance Equipment used: Rolling walker (2 wheels) Transfers: Sit to/from Stand, Bed to chair/wheelchair/BSC Sit to Stand: Max assist, +2 physical assistance, From elevated surface   Step pivot transfers: Mod assist, +2 safety/equipment       General transfer comment: Increased time/dense cues for UE and LE positioning for decreased pain/increased safety; pt fatigued and pain severe after pivoting to/from [facility] so defers longer gait trial in room.    Ambulation/Gait Ambulation/Gait assistance: Mod assist, +2 safety/equipment   Assistive device: Rolling walker (2 wheels) Gait Pattern/deviations: Step-to pattern, Shuffle, Antalgic       General Gait Details: distance limited to ~26f total sidesteps along EOB and pivotal steps from BSC>bed (249fpivot +40f2feturn pivot total)   Stairs             Wheelchair Mobility    Modified Rankin (Stroke Patients Only)       Balance Overall balance assessment: Needs assistance Sitting-balance support: No upper extremity supported Sitting balance-Leahy Scale: Fair Sitting balance - Comments: posterior lean while pt performing LAQ seated EOB   Standing balance support: Bilateral upper extremity supported, Reliant on assistive device for balance Standing balance-Leahy Scale: Poor Standing balance comment: reliant on RW due to pain, need external assist at times while weight shifting  Cognition Arousal/Alertness: Awake/alert Behavior During Therapy: WFL for tasks assessed/performed Overall Cognitive Status: Within Functional Limits for tasks assessed                                  General Comments: Pt following 1-step cues well, internally distracted at times due to pain. Needs reminders for proper body mechanics each transfer.        Exercises Total Joint Exercises Ankle Circles/Pumps: AROM, Both, 10 reps, Supine Quad Sets: AROM, Both, Supine (~3 reps) Hip ABduction/ADduction: AAROM, Supine, Left, 5 reps (pt using gt belt as leg lifter during bed mobility with cues for ADduction muscle activation and only using strap PRN) Straight Leg Raises: AAROM, Left, Supine, 5 reps Long Arc Quad: AAROM, Left, Seated, 5 reps (pt needs AA to reach TKE) Knee Flexion: AROM, Left, 10 reps, Seated (washcloth under her L foot for improved ROM) Goniometric ROM: grossly 8 deg to 70 deg seated EOB    General Comments General comments (skin integrity, edema, etc.): pt reports L knee wrappings over dressing feel very tight and this is exacerbating her pain, RN called to room to change her dresssing during session, PTA assist with elevating pt's leg while old dressing removed and RN placed new aquacel; no acute s/sx distress during functional mobility tasks other than knee pain, pt denies lightheadedness      Pertinent Vitals/Pain Pain Assessment Pain Assessment: 0-10 Pain Score: 8  Pain Location: L knee with weight bearing and flexion Pain Descriptors / Indicators: Sharp, Grimacing, Discomfort, Operative site guarding, Sore Pain Intervention(s): Limited activity within patient's tolerance, Monitored during session, Premedicated before session, Repositioned, Ice applied    PT Goals (current goals can now be found in the care plan section) Acute Rehab PT Goals Patient Stated Goal: return home; decrease pain PT Goal Formulation: With patient Time For Goal Achievement: 02/22/22 Progress towards PT goals: Progressing toward goals    Frequency    7X/week      PT Plan Current plan remains appropriate       AM-PAC PT "6 Clicks" Mobility    Outcome Measure  Help needed turning from your back to your side while in a flat bed without using bedrails?: A Little Help needed moving from lying on your back to sitting on the side of a flat bed without using bedrails?: A Lot (flat bed/no rails) Help needed moving to and from a bed to a chair (including a wheelchair)?: A Lot Help needed standing up from a chair using your arms (e.g., wheelchair or bedside chair)?: A Lot Help needed to walk in hospital room?: Total Help needed climbing 3-5 steps with a railing? : Total 6 Click Score: 11    End of Session Equipment Utilized During Treatment: Gait belt Activity Tolerance: Patient limited by pain Patient left: in bed;with call bell/phone within reach;with bed alarm set;Other (comment) (bed in chair posture with legs flattened, HOB elevated >40* for aspiration prevention/improved pulmonary clearance) Nurse Communication: Mobility status;Patient requests pain meds;Precautions;Other (comment) (+2 transfers) PT Visit Diagnosis: Other abnormalities of gait and mobility (R26.89);Muscle weakness (generalized) (M62.81);Pain Pain - Right/Left: Left Pain - part of body: Knee     Time: 1200-1243 PT Time Calculation (min) (ACUTE ONLY): 43 min  Charges:  $Therapeutic Exercise: 8-22 mins $Therapeutic Activity: 23-37 mins                     Brinley Rosete P., PTA Acute Rehabilitation  Services Secure Chat Preferred 9a-5:30pm Office: [place] 02/13/2022, 1:20 PM

## 2022-02-13 NOTE — Progress Notes (Signed)
[facility] Progress Note   Subjective:  Seen in room. Completed dialysis yesterday -no issues. No complaints. For discharge to SNF today.   Objective Vitals:   02/12/22 1327 02/12/22 1630 02/12/22 2100 02/13/22 0743  BP: (!) 136/50 (!) 142/62 (!) 120/45 (!) 107/43  Pulse: 72 68 77 69  Resp: 20   15  Temp: 98.5 F (36.9 C) 98.4 F (36.9 C) 99 F (37.2 C) 98.3 F (36.8 C)  TempSrc: Oral Oral Oral Oral  SpO2: 98% 98% 93% 95%  Weight:      Height:       Physical Exam General: well appearing female in NAD Heart: RRR no mrg Lungs: CTAB, nml WOB on RA Abdomen: soft, NTND Extremities: LLE wrapped, no edema b/l Dialysis Access: LU AVF +b/t   Filed Weights   02/09/22 1855 02/12/22 0805 02/12/22 1304  Weight: 120.4 kg 122 kg 120.7 kg    Intake/Output Summary (Last 24 hours) at 02/13/2022 1127 Last data filed at 02/13/2022 1100 Gross per 24 hour  Intake 240 ml  Output --  Net 240 ml     Additional Objective Labs: Basic Metabolic Panel: Recent Labs  Lab 02/08/22 0620 02/09/22 1519 02/12/22 0248  NA 141 135 130*  K 3.9 4.1 4.3  CL 96* 94* 90*  CO2  --  27 27  GLUCOSE 68* 152* 154*  BUN 36* 52* 75*  CREATININE 5.50* 6.45* 8.49*  CALCIUM  --  9.2 9.2  PHOS  --  5.9* 5.3*    Liver Function Tests: Recent Labs  Lab 02/09/22 1519 02/12/22 0248  ALBUMIN 3.3* 2.8*    CBC: Recent Labs  Lab 02/08/22 0620 02/09/22 1519 02/12/22 0248  WBC  --  8.4 8.3  HGB 10.5* 10.1* 9.6*  HCT 31.0* 31.3* 28.0*  MCV  --  98.1 94.6  PLT  --  197 196    Medications:  sodium chloride Stopped (02/08/22 1300)   methocarbamol (ROBAXIN) IV      allopurinol  100 mg Oral Daily   aspirin EC  325 mg Oral Q breakfast   atorvastatin  20 mg Oral Daily   buPROPion  150 mg Oral BID   carvedilol  25 mg Oral BID WC   Chlorhexidine Gluconate Cloth  6 each Topical Q0600   docusate sodium  100 mg Oral BID   escitalopram  10 mg Oral Daily   fenofibrate  160 mg Oral  Daily   ferric citrate  420 mg Oral TID WC   furosemide  80 mg Oral Once per day on Sun Mon Wed Fri   insulin aspart  0-15 Units Subcutaneous TID WC   insulin aspart  4 Units Subcutaneous TID WC   isosorbide mononitrate  30 mg Oral Daily   linaclotide  145 mcg Oral QAC breakfast   sevelamer carbonate  1,600 mg Oral TID WC    Dialysis Orders: [facility] - [facility]  4hrs, BFR 350, DFR 600,  EDW 122.6kg, 2K/ 2.5Ca   Access: LU AVF - 16G needles  Heparin 3200 Hectorol 51mg IV qHD    Assessment/Plan:  Osteoarthritis - s/p L TKR 02/09/22.   ESRD -  On TTS.  Continue per regular schedule. Next HD 1/25  Hypertension/volume  - BP well controlled. Does not appear volume overloaded.  UF as tolerated. Is below dry weight.    Anemia of CKD - hgb 9.6.  Not currently on ESA, follow labs.   Secondary Hyperparathyroidism -  Ca acceptable, phos mildly elevated.  Continue VDRA and binders - auryxia 2 AC.   Nutrition - Renal diet w/fluid restrictions.  DMT2 - per primary Dispo - plan for SNF placement  Alexandros Ewan Larina Earthly PA-C [facility] 02/13/2022,11:27 AM

## 2022-02-13 NOTE — TOC Progression Note (Addendum)
Transition of Care [facility]) - Progression Note    Patient Details  Name: Chelsey Lee MRN: [id number] Date of Birth: [birth year]  Transition of Care [facility]) CM/SW Contact  Joanne Chars, LCSW Phone Number: 02/13/2022, 11:29 AM  Clinical Narrative:    DC summary on hub for [facility].  1120: confirmed with [facility] that outpt HD times work for them.  [facility] asking for CPAP settings, spoke with Katie/respiratory and she will put note in with settings.   Expected Discharge Plan: [place] Barriers to Discharge: Continued Medical Work up  Expected Discharge Plan and Services   Discharge Planning Services: CM Consult     Expected Discharge Date: 02/13/22               DME Arranged: Gilford Rile rolling DME Agency: [employer] Date DME Agency Contacted: 02/09/22 Time DME Agency Contacted: [phone] Representative spoke with at DME Agency: Dennard Schaumann Arranged: PT [place]: [place] Date [place]: 02/09/22 Time [place]: [phone] Representative spoke with at [place]: orthobundle   Social Determinants of Health ([place]) Interventions SDOH Screenings   Food Insecurity: No Food Insecurity (02/08/2022)  Housing: Low Risk  (02/08/2022)  Transportation Needs: No Transportation Needs (02/08/2022)  Utilities: Not At Risk (02/08/2022)  Depression (PHQ2-9): Low Risk  (11/08/2019)  Tobacco Use: Low Risk  (02/12/2022)    Readmission Risk Interventions     No data to display

## 2022-02-13 NOTE — Telephone Encounter (Signed)
I called and sw [name] and advised to use for 10 min q hour while awake. She asked that this be faxed to facility. Faxed orders to call with questions.

## 2022-02-13 NOTE — Discharge Summary (Signed)
Discharge Diagnoses:  Principal Problem:   OA (osteoarthritis) of knee Active Problems:   Unilateral primary osteoarthritis, left knee   Total knee replacement status, left   Surgeries: Procedure(s): LEFT TOTAL KNEE ARTHROPLASTY on 02/08/2022    Consultants: Treatment Team:  Madelon Lips, MD Roney Jaffe, MD  Discharged Condition: Improved  Hospital Course: Chelsey Lee is an 67 y.o. female who was admitted 02/08/2022 with a chief complaint of osteoarthritis left knee, with a final diagnosis of Osteoarthritis Left Knee.  Patient was brought to the operating room on 02/08/2022 and underwent Procedure(s): LEFT TOTAL KNEE ARTHROPLASTY.    Patient was given perioperative antibiotics:  Anti-infectives (From admission, onward)    Start     Dose/Rate Route Frequency Ordered Stop   02/09/22 0800  ceFAZolin (ANCEF) IVPB 2g/100 mL premix  Status:  Discontinued        2 g 200 mL/hr over 30 Minutes Intravenous Every 24 hours 02/08/22 1235 02/08/22 1254   02/09/22 0800  ceFAZolin (ANCEF) IVPB 1 g/50 mL premix        1 g 100 mL/hr over 30 Minutes Intravenous Every 24 hours 02/08/22 1254 02/09/22 0955   02/08/22 0615  ceFAZolin (ANCEF) IVPB 3g/100 mL premix        3 g 200 mL/hr over 30 Minutes Intravenous On call to O.R. 02/08/22 8032 02/08/22 0732     .  Patient was given sequential compression devices, early ambulation, and aspirin for DVT prophylaxis.  Recent vital signs: Patient Vitals for the past 24 hrs:  BP Temp Temp src Pulse Resp SpO2 Weight  02/12/22 2100 (!) 120/45 99 F (37.2 C) Oral 77 -- 93 % --  02/12/22 1630 (!) 142/62 98.4 F (36.9 C) Oral 68 -- 98 % --  02/12/22 1327 (!) 136/50 98.5 F (36.9 C) Oral 72 20 98 % --  02/12/22 1304 -- -- -- -- -- -- 120.7 kg  02/12/22 1251 (!) 137/44 -- -- 71 (!) 23 -- --  02/12/22 1231 (!) 134/48 -- -- 72 19 96 % --  02/12/22 1206 (!) 112/93 -- -- 65 (!) 29 99 % --  02/12/22 1130 (!) 133/49 -- -- 71 20 97 % --  02/12/22  1105 (!) 112/48 -- -- 72 19 98 % --  02/12/22 1101 -- -- -- 76 16 90 % --  02/12/22 1030 (!) 132/51 -- -- 73 18 97 % --  02/12/22 1000 (!) 119/54 -- -- 69 (!) 21 -- --  02/12/22 0934 (!) 131/50 -- -- 72 14 100 % --  02/12/22 0909 (!) 129/49 -- -- 70 19 94 % --  02/12/22 0845 (!) 125/48 -- -- 71 19 96 % --  02/12/22 0805 (!) 125/45 98.7 F (37.1 C) -- 71 (!) 23 93 % 122 kg  02/12/22 0728 (!) 129/47 98.4 F (36.9 C) Oral 70 17 93 % --  .  Recent laboratory studies: No results found.  Discharge Medications:   Allergies as of 02/13/2022   No Known Allergies      Medication List     STOP taking these medications    HYDROcodone-acetaminophen 5-325 MG tablet Commonly known as: Norco       TAKE these medications    allopurinol 100 MG tablet Commonly known as: ZYLOPRIM Take 1 tablet (100 mg total) by mouth daily.   ammonium lactate 12 % lotion Commonly known as: AmLactin Apply 1 Application topically as needed for dry skin.   atorvastatin 20 MG tablet Commonly known as:  LIPITOR Take 20 mg by mouth daily.   Auryxia 1 GM 210 MG(Fe) tablet Generic drug: ferric citrate Take 210-420 mg by mouth See admin instructions. 420 mg three times daily with meals, and 210 mg twice daily with snacks   buPROPion 150 MG 12 hr tablet Commonly known as: WELLBUTRIN SR Take 150 mg by mouth 2 (two) times daily.   carvedilol 25 MG tablet Commonly known as: COREG Take 25 mg by mouth 2 (two) times daily with a meal.   CHELATED MAGNESIUM PO Take 2 tablets by mouth every evening.   DIALYVITE TABLET Tabs Take 1 tablet by mouth daily.   escitalopram 10 MG tablet Commonly known as: LEXAPRO Take 10 mg by mouth daily.   fenofibrate 160 MG tablet Take 160 mg by mouth daily.   FreeStyle Libre 2 Sensor Misc   OneTouch Ultra test strip Generic drug: glucose blood   FREESTYLE LITE test strip Generic drug: glucose blood SMARTSIG:Via Meter 2-3 Times Daily   furosemide 80 MG  tablet Commonly known as: LASIX Take 80 mg by mouth See admin instructions. On Non- Dialysis Days, Sun, Mon, Wed, and Fri   insulin NPH-regular Human (70-30) 100 UNIT/ML injection Inject 40-50 Units into the skin See admin instructions. Inject 50 units into the skin in the morning and 40 units at night   isosorbide mononitrate 30 MG 24 hr tablet Commonly known as: IMDUR Take 30 mg by mouth daily.   Linzess 145 MCG Caps capsule Generic drug: linaclotide Take 145 mcg by mouth daily.   metolazone 2.5 MG tablet Commonly known as: ZAROXOLYN Take 2.5 mg by mouth once a week.   oxyCODONE-acetaminophen 5-325 MG tablet Commonly known as: PERCOCET/ROXICET Take 1 tablet by mouth every 4 (four) hours as needed.   Ozempic (0.25 or 0.5 MG/DOSE) 2 MG/3ML Sopn Generic drug: Semaglutide(0.25 or 0.'5MG'$ /DOS) Inject 0.25 mg into the skin once a week.   sevelamer carbonate 800 MG tablet Commonly known as: RENVELA Take 1,600 mg by mouth 3 (three) times daily with meals.   Trulicity 1.5 PJ/8.2NK Sopn Generic drug: Dulaglutide Inject 1.5 mg into the skin once a week.               Durable Medical Equipment  (From admission, onward)           Start     Ordered   02/09/22 0836  For home use only DME Walker rolling  Once       Comments: Patient requiring heavy duty rollator d/t body habitus. Standard walker is too narrow for patient to manage safely.  Question Answer Comment  Walker: With [place]   Patient needs a walker to treat with the following condition Decreased functional mobility and endurance      02/09/22 0837              Discharge Care Instructions  (From admission, onward)           Start     Ordered   02/13/22 0000  Weight bearing as tolerated       Question Answer Comment  Laterality left   Extremity Lower      02/13/22 0649            Diagnostic Studies: No results found.  Patient benefited maximally from their hospital stay and there  were no complications.     Disposition: Discharge disposition: 62-Rehab Facility      Discharge Instructions     Call MD / Call 911  Complete by: As directed    If you experience chest pain or shortness of breath, CALL 911 and be transported to the hospital emergency room.  If you develope a fever above 101 F, pus (white drainage) or increased drainage or redness at the wound, or calf pain, call your surgeon's office.   Constipation Prevention   Complete by: As directed    Drink plenty of fluids.  Prune juice may be helpful.  You may use a stool softener, such as Colace (over the counter) 100 mg twice a day.  Use MiraLax (over the counter) for constipation as needed.   Diet - low sodium heart healthy   Complete by: As directed    Increase activity slowly as tolerated   Complete by: As directed    Post-operative opioid taper instructions:   Complete by: As directed    POST-OPERATIVE OPIOID TAPER INSTRUCTIONS: It is important to wean off of your opioid medication as soon as possible. If you do not need pain medication after your surgery it is ok to stop day one. Opioids include: Codeine, Hydrocodone(Norco, Vicodin), Oxycodone(Percocet, oxycontin) and hydromorphone amongst others.  Long term and even short term use of opiods can cause: Increased pain response Dependence Constipation Depression Respiratory depression And more.  Withdrawal symptoms can include Flu like symptoms Nausea, vomiting And more Techniques to manage these symptoms Hydrate well Eat regular healthy meals Stay active Use relaxation techniques(deep breathing, meditating, yoga) Do Not substitute Alcohol to help with tapering If you have been on opioids for less than two weeks and do not have pain than it is ok to stop all together.  Plan to wean off of opioids This plan should start within one week post op of your joint replacement. Maintain the same interval or time between taking each dose and first  decrease the dose.  Cut the total daily intake of opioids by one tablet each day Next start to increase the time between doses. The last dose that should be eliminated is the evening dose.      Weight bearing as tolerated   Complete by: As directed    Laterality: left   Extremity: Lower       Follow-up Information     Newt Minion, MD Follow up in 1 week(s).   Specialty: Orthopedic Surgery Contact information: [place] [address] [place], [place] Follow up.   Specialty: [place] Why: Someone at the office will call to schedule home health physical therapy visits Contact information: [place] [address] [phone]                  Signed: Newt Minion 02/13/2022, 6:50 AM

## 2022-02-13 NOTE — TOC Transition Note (Signed)
Transition of Care [facility]) - CM/SW Discharge Note   Patient Details  Name: Chelsey Lee MRN: [id number] Date of Birth: [birth year]  Transition of Care [facility]) CM/SW Contact:  Joanne Chars, LCSW Phone Number: 02/13/2022, 11:52 AM   Clinical Narrative:   Pt discharging to [facility], room 220.  RN call [phone] for report.  Ask for 200 hall RN.    Final next level of care: Skilled Nursing Facility Barriers to Discharge: Barriers Resolved   Patient Goals and CMS Choice CMS Medicare.gov Compare Post Acute Care list provided to:: Patient    Discharge Placement                Patient chooses bed at:  [facility]) Patient to be transferred to facility by: [place] Name of family member notified: sister Olga Coaster Patient and family notified of of transfer: 02/13/22  Discharge Plan and Services Additional resources added to the After Visit Summary for     Discharge Planning Services: CM Consult            DME Arranged: Gilford Rile rolling DME Agency: [employer] Date DME Agency Contacted: 02/09/22 Time DME Agency Contacted: [phone] Representative spoke with at DME Agency: Dennard Schaumann Arranged: PT [place]: [place] Date [place]: 02/09/22 Time [place]: [phone] Representative spoke with at [place]: orthobundle  Social Determinants of Health ([place]) Interventions SDOH Screenings   Food Insecurity: No Food Insecurity (02/08/2022)  Housing: Low Risk  (02/08/2022)  Transportation Needs: No Transportation Needs (02/08/2022)  Utilities: Not At Risk (02/08/2022)  Depression (PHQ2-9): Low Risk  (11/08/2019)  Tobacco Use: Low Risk  (02/12/2022)     Readmission Risk Interventions     No data to display

## 2022-02-13 NOTE — Progress Notes (Signed)
Patient ID: Chelsey Lee, female   DOB: 26-Jun-1955, 67 y.o.   MRN: [id number] Patient states she is ready for discharge to skilled nursing today.  She states the dressing has become tight.  Will have the dressing removed and Aquacel or Mepilex dressing can be applied to the incision.

## 2022-02-13 NOTE — Progress Notes (Signed)
Pt to d/c to snf today. Contacted May and  spoke to [place], Therapist, sports. Clinic advised pt to d/c to [facility] today and should resume care tomorrow. CSW confirms that snf can accommodate pt's clinic and schedule.   Melven Sartorius Renal Navigator [phone]

## 2022-02-13 NOTE — Progress Notes (Signed)
Per pts chart, pt does wear CPAP at night. Pt has been using home mask. Per night shift RT charting, pt's CPAP settings are; Flow rate= 3L oxygen bleed in auto CPAP max of 14 with a minium of 5

## 2022-02-13 NOTE — Telephone Encounter (Signed)
[name] with [facility] called triage. She was admitted today with [facility] and came with an ice machine. They need orders for the ice machine. Fax#[phone] Phone#[phone] Thanks

## 2022-02-13 NOTE — Care Management Important Message (Signed)
Important Message  Patient Details  Name: Chelsey Lee MRN: [id number] Date of Birth: 1955/08/23   Medicare Important Message Given:  Yes     Hannah Beat 02/13/2022, 10:53 AM

## 2022-02-13 NOTE — Telephone Encounter (Signed)
Patient need 1 week po knee replacement / surgery date 02/08/22, no appt available please call (Debrah) at [facility]

## 2022-02-14 NOTE — Telephone Encounter (Signed)
This pt is s/p a total knee and will need follow up in office in 2 weeks with Junie Panning can you sch apt for Friday 02/22/22 please? Pt is at [facility] [phone] ask for Neoma Laming Thanks!

## 2022-02-14 NOTE — Telephone Encounter (Signed)
For a total knee that can come back in 2 weeks for 1st post op. Can you call and make an appt for her with Junie Panning for 02/22/2022 please? Thanks!

## 2022-02-14 NOTE — Telephone Encounter (Signed)
Spoke with Neoma Laming. Patient scheduled with Erin on 02/22/2022 at 0930.

## 2022-02-20 ENCOUNTER — Ambulatory Visit (INDEPENDENT_AMBULATORY_CARE_PROVIDER_SITE_OTHER): Payer: Medicare Other | Admitting: Family

## 2022-02-20 ENCOUNTER — Ambulatory Visit (INDEPENDENT_AMBULATORY_CARE_PROVIDER_SITE_OTHER): Payer: Medicare Other

## 2022-02-20 ENCOUNTER — Encounter: Payer: Self-pay | Admitting: Family

## 2022-02-20 DIAGNOSIS — M1712 Unilateral primary osteoarthritis, left knee: Secondary | ICD-10-CM

## 2022-02-20 NOTE — Progress Notes (Signed)
Post-Op Visit Note   Patient: Chelsey Lee           Date of Birth: 09-Nov-1955           MRN: [id number] Visit Date: 02/20/2022 PCP: Willey Blade, MD  Chief Complaint:  Chief Complaint  Patient presents with   Left Knee - Routine Post Op    02/08/2022 left TKR    HPI:  HPI Patient is 67 year old woman seen status post left total knee arthroplasty January 19 she is residing at skilled nursing.  Her incision is well-approximated staples she is using an ice machine  Residing at [facility] On examination left knee the incision is well-approximated staples there is no gaping drainage or erythema no ecchymosis.  Moderate edema no pitting  Visit Diagnoses:  1. Primary osteoarthritis of left knee     Plan: Staples harvested today Steri-Strips applied continue daily dose of cleansing dry dressings.  Aggressive physical therapy for flexion strengthening  Follow-Up Instructions: Return in about 1 week (around 02/27/2022).   Imaging: No results found.  Orders:  Orders Placed This Encounter  Procedures   XR Knee 1-2 Views Left   No orders of the defined types were placed in this encounter.    PMFS History: Patient Active Problem List   Diagnosis Date Noted   OA (osteoarthritis) of knee 02/08/2022   Total knee replacement status, left 02/08/2022   Pain in left hand 06/29/2021   Cerebrovascular disease 05/11/2020   Gout 03/09/2020   Allergic rhinitis 01/25/2020   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   Colon cancer screening 01/25/2020   Gastroesophageal reflux disease without esophagitis 01/25/2020   Hyperlipidemia 01/25/2020   Morbid obesity ([place]) 01/25/2020   Personal history of colonic polyps 01/25/2020   OSA (obstructive sleep apnea) 01/25/2020   Vitamin D deficiency 01/25/2020   Controlled type 2 diabetes mellitus without complication ([place]) 46/56/8127   Chronic kidney disease, stage 4 (severe) ([place]) 06/29/2019   Long term (current) use  of insulin ([place]) 06/29/2019   Epidural lipomatosis 03/02/2019   Spondylolisthesis of lumbar region 03/02/2019   Spinal stenosis of lumbar region with neurogenic claudication 03/02/2019   Left wrist tendinitis 09/07/2018   Unilateral primary osteoarthritis, left knee 04/03/2018   Celiac disease 08/21/2016   Abnormal thyroid function test 04/01/2016   Pain in thumb joint with movement of right hand 09/14/2015   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   Moderate major depression, single episode ([place]) 02/02/2010   Diabetic renal disease ([place]) 06/07/2009   Anemia 01/18/2009   Essential hypertension 01/18/2009   Past Medical History:  Diagnosis Date   Arthritis    Diabetes mellitus    ESRD on HD    T Th Sa   GERD (gastroesophageal reflux disease)    Hyperlipemia    Hypertension    Seasonal allergies    Sleep apnea     History reviewed. No pertinent family history.  Past Surgical History:  Procedure Laterality Date   AV FISTULA PLACEMENT Left 04/10/2020   Procedure: LEFT ARM ARTERIOVENOUS (AV) FISTULA CREATION;  Surgeon: Elam Dutch, MD;  Location: [facility];  Service: Vascular;  Laterality: Left;   CAPSULOTOMY  01/13/2012   Procedure: MINOR CAPSULOTOMY;  Surgeon: Myrtha Mantis., MD;  Location: [facility];  Service: Ophthalmology;  Laterality: Left;   DORSAL COMPARTMENT RELEASE  01/22/2007   rt    FISTULA SUPERFICIALIZATION Left 05/29/2020   Procedure: LEFT UPPER ARM FISTULA SUPERFICIALIZATION WITH LIGATION OF MULTIPLE SIDE BRANCHES;  Surgeon: Oneida Alar,  Jessy Oto, MD;  Location: [facility];  Service: Vascular;  Laterality: Left;   KNEE ARTHROSCOPY  02/21/2002   right and left   STERIOD INJECTION  08/01/2011   Procedure: STEROID INJECTION;  Surgeon: Cammie Sickle., MD;  Location: [facility];  Service: Orthopedics;  Laterality: Left;  cmc left thumb   TONSILLECTOMY     TOTAL KNEE ARTHROPLASTY Left 02/08/2022   Procedure: LEFT TOTAL KNEE ARTHROPLASTY;  Surgeon: Newt Minion,  MD;  Location: [place];  Service: Orthopedics;  Laterality: Left;   TRIGGER FINGER RELEASE  01/22/2004   rt thumb   TRIGGER FINGER RELEASE  08/01/2011   Procedure: RELEASE TRIGGER FINGER/A-1 PULLEY;  Surgeon: Cammie Sickle., MD;  Location: [facility];  Service: Orthopedics;  Laterality: Left;  left thumb   YAG LASER APPLICATION  48/88/9169   Procedure: YAG LASER APPLICATION;  Surgeon: Myrtha Mantis., MD;  Location: [facility];  Service: Ophthalmology;  Laterality: Left;   Social History   Occupational History   Not on file  Tobacco Use   Smoking status: Never   Smokeless tobacco: Never  Vaping Use   Vaping Use: Never used  Substance and Sexual Activity   Alcohol use: No   Drug use: No   Sexual activity: Not on file

## 2022-02-22 ENCOUNTER — Encounter: Payer: Medicare Other | Admitting: Family

## 2022-03-06 ENCOUNTER — Encounter: Payer: Medicare Other | Admitting: Family

## 2022-03-06 NOTE — Progress Notes (Deleted)
Post-Op Visit Note   Patient: Chelsey Lee           Date of Birth: [birth year]           MRN: [id number] Visit Date: 03/06/2022 PCP: Willey Blade, MD  Chief Complaint: No chief complaint on file.   HPI:  HPI The patient is a 67 year old woman who presents status post total knee arthroplasty 02/13/22, on the left knee. Ortho Exam ***  Visit Diagnoses: No diagnosis found.  Plan: ***  Follow-Up Instructions: No follow-ups on file.   Imaging: No results found.  Orders:  No orders of the defined types were placed in this encounter.  No orders of the defined types were placed in this encounter.    PMFS History: Patient Active Problem List   Diagnosis Date Noted   OA (osteoarthritis) of knee 02/08/2022   Total knee replacement status, left 02/08/2022   Pain in left hand 06/29/2021   Cerebrovascular disease 05/11/2020   Gout 03/09/2020   Allergic rhinitis 01/25/2020   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   Colon cancer screening 01/25/2020   Gastroesophageal reflux disease without esophagitis 01/25/2020   Hyperlipidemia 01/25/2020   Morbid obesity ([place]) 01/25/2020   Personal history of colonic polyps 01/25/2020   OSA (obstructive sleep apnea) 01/25/2020   Vitamin D deficiency 01/25/2020   Controlled type 2 diabetes mellitus without complication ([place]) [id number]   Chronic kidney disease, stage 4 (severe) ([place]) 06/29/2019   Long term (current) use of insulin ([place]) 06/29/2019   Epidural lipomatosis 03/02/2019   Spondylolisthesis of lumbar region 03/02/2019   Spinal stenosis of lumbar region with neurogenic claudication 03/02/2019   Left wrist tendinitis 09/07/2018   Unilateral primary osteoarthritis, left knee 04/03/2018   Celiac disease 08/21/2016   Abnormal thyroid function test 04/01/2016   Pain in thumb joint with movement of right hand 09/14/2015   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   Moderate major depression, single episode ([place]) 02/02/2010    Diabetic renal disease ([place]) 06/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  Past Surgical History:  Procedure Laterality Date   AV FISTULA PLACEMENT Left 04/10/2020   Procedure: LEFT ARM ARTERIOVENOUS (AV) FISTULA CREATION;  Surgeon: Elam Dutch, MD;  Location: [facility];  Service: Vascular;  Laterality: Left;   CAPSULOTOMY  01/13/2012   Procedure: MINOR CAPSULOTOMY;  Surgeon: Myrtha Mantis., MD;  Location: [facility];  Service: Ophthalmology;  Laterality: Left;   DORSAL COMPARTMENT RELEASE  01/22/2007   rt    FISTULA SUPERFICIALIZATION Left 05/29/2020   Procedure: LEFT UPPER ARM FISTULA SUPERFICIALIZATION WITH LIGATION OF MULTIPLE SIDE BRANCHES;  Surgeon: Elam Dutch, MD;  Location: [facility];  Service: Vascular;  Laterality: Left;   KNEE ARTHROSCOPY  02/21/2002   right and left   STERIOD INJECTION  08/01/2011   Procedure: STEROID INJECTION;  Surgeon: Cammie Sickle., MD;  Location: [place];  Service: Orthopedics;  Laterality: Left;  cmc left thumb   TONSILLECTOMY     TOTAL KNEE ARTHROPLASTY Left 02/08/2022   Procedure: LEFT TOTAL KNEE ARTHROPLASTY;  Surgeon: Newt Minion, MD;  Location: [facility];  Service: Orthopedics;  Laterality: Left;   TRIGGER FINGER RELEASE  01/22/2004   rt thumb   TRIGGER FINGER RELEASE  08/01/2011   Procedure: RELEASE TRIGGER FINGER/A-1 PULLEY;  Surgeon: Cammie Sickle., MD;  Location: [facility];  Service: Orthopedics;  Laterality: Left;  left thumb   YAG LASER APPLICATION  [id number]   Procedure: YAG LASER APPLICATION;  Surgeon: Myrtha Mantis., MD;  Location: [place];  Service: Ophthalmology;  Laterality: Left;   Social History    Occupational History   Not on file  Tobacco Use   Smoking status: Never   Smokeless tobacco: Never  Vaping Use   Vaping Use: Never used  Substance and Sexual Activity   Alcohol use: No   Drug use: No   Sexual activity: Not on file

## 2022-03-13 ENCOUNTER — Ambulatory Visit (INDEPENDENT_AMBULATORY_CARE_PROVIDER_SITE_OTHER): Payer: Medicare Other | Admitting: Family

## 2022-03-13 ENCOUNTER — Encounter: Payer: Self-pay | Admitting: Family

## 2022-03-13 ENCOUNTER — Ambulatory Visit: Payer: Medicare Other | Admitting: Podiatry

## 2022-03-13 DIAGNOSIS — M1712 Unilateral primary osteoarthritis, left knee: Secondary | ICD-10-CM

## 2022-03-13 NOTE — Progress Notes (Signed)
Post-Op Visit Note   Patient: Chelsey Lee           Date of Birth: August 26, 1955           MRN: [id number] Visit Date: 03/13/2022 PCP: Willey Blade, MD  Chief Complaint:  Chief Complaint  Patient presents with   Left Knee - Routine Post Op    02/08/2022 left TKR    HPI:  HPI The patient is a 67 year old woman who presents status post left total knee arthroplasty on January 19.  She is residing at rehab but will be discharged home later this week Ortho Exam On examination of the left knee her incision is well-healed she does continue to have trace edema which extends into her lower leg near full extension flexion to 85 degrees there is no erythema or concerning sign  Visit Diagnoses: No diagnosis found.  Plan: Continue PT will continue home PT once discharged home follow-up in the office in 4 weeks  Follow-Up Instructions: No follow-ups on file.   Imaging: No results found.  Orders:  No orders of the defined types were placed in this encounter.  No orders of the defined types were placed in this encounter.    PMFS History: Patient Active Problem List   Diagnosis Date Noted   OA (osteoarthritis) of knee 02/08/2022   Total knee replacement status, left 02/08/2022   Pain in left hand 06/29/2021   Cerebrovascular disease 05/11/2020   Gout 03/09/2020   Allergic rhinitis 01/25/2020   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   Colon cancer screening 01/25/2020   Gastroesophageal reflux disease without esophagitis 01/25/2020   Hyperlipidemia 01/25/2020   Morbid obesity ([place]) 01/25/2020   Personal history of colonic polyps 01/25/2020   OSA (obstructive sleep apnea) 01/25/2020   Vitamin D deficiency 01/25/2020   Controlled type 2 diabetes mellitus without complication ([place]) [id number]   Chronic kidney disease, stage 4 (severe) ([place]) 06/29/2019   Long term (current) use of insulin ([place]) 06/29/2019   Epidural lipomatosis 03/02/2019   Spondylolisthesis of  lumbar region 03/02/2019   Spinal stenosis of lumbar region with neurogenic claudication 03/02/2019   Left wrist tendinitis 09/07/2018   Unilateral primary osteoarthritis, left knee 04/03/2018   Celiac disease 08/21/2016   Abnormal thyroid function test 04/01/2016   Pain in thumb joint with movement of right hand 09/14/2015   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   Moderate major depression, single episode ([place]) 02/02/2010   Diabetic renal disease ([place]) 06/07/2009   Anemia 01/18/2009   Essential hypertension 01/18/2009   Past Medical History:  Diagnosis Date   Arthritis    Diabetes mellitus    ESRD on HD    T Th Sa   GERD (gastroesophageal reflux disease)    Hyperlipemia    Hypertension    Seasonal allergies    Sleep apnea     History reviewed. No pertinent family history.  Past Surgical History:  Procedure Laterality Date   AV FISTULA PLACEMENT Left 04/10/2020   Procedure: LEFT ARM ARTERIOVENOUS (AV) FISTULA CREATION;  Surgeon: Elam Dutch, MD;  Location: [place];  Service: Vascular;  Laterality: Left;   CAPSULOTOMY  01/13/2012   Procedure: MINOR CAPSULOTOMY;  Surgeon: Myrtha Mantis., MD;  Location: [place];  Service: Ophthalmology;  Laterality: Left;   DORSAL COMPARTMENT RELEASE  01/22/2007   rt    FISTULA SUPERFICIALIZATION Left 05/29/2020   Procedure: LEFT UPPER ARM FISTULA SUPERFICIALIZATION WITH LIGATION OF MULTIPLE SIDE BRANCHES;  Surgeon: Elam Dutch, MD;  Location: [facility]  OR;  Service: Vascular;  Laterality: Left;   KNEE ARTHROSCOPY  02/21/2002   right and left   STERIOD INJECTION  08/01/2011   Procedure: STEROID INJECTION;  Surgeon: Cammie Sickle., MD;  Location: [place];  Service: Orthopedics;  Laterality: Left;  [facility] left thumb   TONSILLECTOMY     TOTAL KNEE ARTHROPLASTY Left 02/08/2022   Procedure: LEFT TOTAL KNEE ARTHROPLASTY;  Surgeon: Newt Minion, MD;  Location: [place];  Service: Orthopedics;  Laterality: Left;   TRIGGER FINGER  RELEASE  01/22/2004   rt thumb   TRIGGER FINGER RELEASE  08/01/2011   Procedure: RELEASE TRIGGER FINGER/A-1 PULLEY;  Surgeon: Cammie Sickle., MD;  Location: [place];  Service: Orthopedics;  Laterality: Left;  left thumb   YAG LASER APPLICATION  [id number]   Procedure: YAG LASER APPLICATION;  Surgeon: Myrtha Mantis., MD;  Location: [place];  Service: Ophthalmology;  Laterality: Left;   Social History   Occupational History   Not on file  Tobacco Use   Smoking status: Never   Smokeless tobacco: Never  Vaping Use   Vaping Use: Never used  Substance and Sexual Activity   Alcohol use: No   Drug use: No   Sexual activity: Not on file

## 2022-03-28 ENCOUNTER — Telehealth: Payer: Self-pay | Admitting: Orthopedic Surgery

## 2022-03-28 NOTE — Telephone Encounter (Signed)
Left total knee 02/08/2022 please see message below and advise.

## 2022-03-28 NOTE — Telephone Encounter (Signed)
Received call from Sheralyn Boatman with [facility]  advised patient is home from [facility] and while she was there patient was put on Percocet.   Patient said she do not like that medication and would like to be put back on Hydrocodone.   The number to contact Judson Roch is [phone]  The number to contact patient is [phone]

## 2022-03-29 MED ORDER — HYDROCODONE-ACETAMINOPHEN 5-325 MG PO TABS: 1.0000 | ORAL_TABLET | ORAL | 0 refills | Status: DC | PRN

## 2022-03-29 NOTE — Telephone Encounter (Signed)
Called pt to advise that rx has been sent to pharm.  

## 2022-03-29 NOTE — Telephone Encounter (Signed)
I will order norco for her

## 2022-04-10 ENCOUNTER — Ambulatory Visit (INDEPENDENT_AMBULATORY_CARE_PROVIDER_SITE_OTHER): Payer: Medicare Other | Admitting: Family

## 2022-04-10 ENCOUNTER — Encounter: Payer: Self-pay | Admitting: Family

## 2022-04-10 DIAGNOSIS — Z96652 Presence of left artificial knee joint: Secondary | ICD-10-CM

## 2022-04-10 NOTE — Progress Notes (Signed)
Post-Op Visit Note   Patient: Chelsey Lee           Date of Birth: Sep 12, 1955           MRN: [id number] Visit Date: 04/10/2022 PCP: Willey Blade, MD  Chief Complaint:  Chief Complaint  Patient presents with   Left Knee - Routine Post Op    02/08/2022 left total knee replacement     HPI:  HPI The patient is a 67 year old woman who is seen today status post left total knee arthroplasty January 19 she has completed her physical therapy she ambulates with a cane in the home she is quite pleased with her progress and range of motion.  Continues to have some swelling that concerns her wonders if she should resume her knee-high compression garments Ortho Exam On examination of left knee incision is well-healed there is moderate edema without warmth or erythema there is full extension she has flexion to 100 degrees actively  Visit Diagnoses: No diagnosis found.  Plan: Complete her home exercise program continue aggressive range of motion strengthening activities as tolerated follow-up in the office as needed  Follow-Up Instructions: No follow-ups on file.   Imaging: No results found.  Orders:  No orders of the defined types were placed in this encounter.  No orders of the defined types were placed in this encounter.    PMFS History: Patient Active Problem List   Diagnosis Date Noted   OA (osteoarthritis) of knee 02/08/2022   Total knee replacement status, left 02/08/2022   Pain in left hand 06/29/2021   Cerebrovascular disease 05/11/2020   Gout 03/09/2020   Allergic rhinitis 01/25/2020   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   Colon cancer screening 01/25/2020   Gastroesophageal reflux disease without esophagitis 01/25/2020   Hyperlipidemia 01/25/2020   Morbid obesity ([place]) 01/25/2020   Personal history of colonic polyps 01/25/2020   OSA (obstructive sleep apnea) 01/25/2020   Vitamin D deficiency 01/25/2020   Controlled type 2 diabetes mellitus without  complication ([place]) [id number]   Chronic kidney disease, stage 4 (severe) ([place]) 06/29/2019   Long term (current) use of insulin ([place]) 06/29/2019   Epidural lipomatosis 03/02/2019   Spondylolisthesis of lumbar region 03/02/2019   Spinal stenosis of lumbar region with neurogenic claudication 03/02/2019   Left wrist tendinitis 09/07/2018   Unilateral primary osteoarthritis, left knee 04/03/2018   Celiac disease 08/21/2016   Abnormal thyroid function test 04/01/2016   Pain in thumb joint with movement of right hand 09/14/2015   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   Moderate major depression, single episode ([place]) 02/02/2010   Diabetic renal disease ([place]) 06/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  Past Surgical History:  Procedure Laterality Date   AV FISTULA PLACEMENT Left 04/10/2020   Procedure: LEFT ARM ARTERIOVENOUS (AV) FISTULA CREATION;  Surgeon: Elam Dutch, MD;  Location: [place];  Service: Vascular;  Laterality: Left;   CAPSULOTOMY  01/13/2012   Procedure: MINOR CAPSULOTOMY;  Surgeon: Myrtha Mantis., MD;  Location: [place];  Service: Ophthalmology;  Laterality: Left;   DORSAL COMPARTMENT RELEASE  01/22/2007   rt    FISTULA  SUPERFICIALIZATION Left 05/29/2020   Procedure: LEFT UPPER ARM FISTULA SUPERFICIALIZATION WITH LIGATION OF MULTIPLE SIDE BRANCHES;  Surgeon: Elam Dutch, MD;  Location: [place];  Service: Vascular;  Laterality: Left;   KNEE ARTHROSCOPY  02/21/2002   right and left   STERIOD INJECTION  08/01/2011   Procedure: STEROID INJECTION;  Surgeon: Cammie Sickle., MD;  Location: [place];  Service: Orthopedics;  Laterality: Left;  cmc left thumb   TONSILLECTOMY     TOTAL KNEE  ARTHROPLASTY Left 02/08/2022   Procedure: LEFT TOTAL KNEE ARTHROPLASTY;  Surgeon: Newt Minion, MD;  Location: [place];  Service: Orthopedics;  Laterality: Left;   TRIGGER FINGER RELEASE  01/22/2004   rt thumb   TRIGGER FINGER RELEASE  08/01/2011   Procedure: RELEASE TRIGGER FINGER/A-1 PULLEY;  Surgeon: Cammie Sickle., MD;  Location: [place];  Service: Orthopedics;  Laterality: Left;  left thumb   YAG LASER APPLICATION  [id number]   Procedure: YAG LASER APPLICATION;  Surgeon: Myrtha Mantis., MD;  Location: [place];  Service: Ophthalmology;  Laterality: Left;   Social History   Occupational History   Not on file  Tobacco Use   Smoking status: Never   Smokeless tobacco: Never  Vaping Use   Vaping Use: Never used  Substance and Sexual Activity   Alcohol use: No   Drug use: No   Sexual activity: Not on file

## 2022-05-22 ENCOUNTER — Encounter: Payer: Self-pay | Admitting: Podiatry

## 2022-05-22 ENCOUNTER — Ambulatory Visit (INDEPENDENT_AMBULATORY_CARE_PROVIDER_SITE_OTHER): Payer: Medicare Other | Admitting: Podiatry

## 2022-05-22 DIAGNOSIS — M79674 Pain in right toe(s): Secondary | ICD-10-CM

## 2022-05-22 DIAGNOSIS — E119 Type 2 diabetes mellitus without complications: Secondary | ICD-10-CM

## 2022-05-22 DIAGNOSIS — M79675 Pain in left toe(s): Secondary | ICD-10-CM

## 2022-05-22 DIAGNOSIS — B351 Tinea unguium: Secondary | ICD-10-CM

## 2022-05-22 NOTE — Progress Notes (Signed)

## 2022-05-24 ENCOUNTER — Telehealth: Payer: Self-pay | Admitting: Orthopedic Surgery

## 2022-05-24 NOTE — Telephone Encounter (Signed)
Patient called in requesting refill on Hydrocodone sent to [facility] on file

## 2022-05-27 ENCOUNTER — Other Ambulatory Visit: Payer: Self-pay | Admitting: Orthopedic Surgery

## 2022-05-27 MED ORDER — HYDROCODONE-ACETAMINOPHEN 5-325 MG PO TABS: 1.0000 | ORAL_TABLET | ORAL | 0 refills | Status: DC | PRN

## 2022-06-06 ENCOUNTER — Other Ambulatory Visit (HOSPITAL_COMMUNITY): Payer: Self-pay

## 2022-07-29 ENCOUNTER — Telehealth: Payer: Self-pay | Admitting: Physical Medicine and Rehabilitation

## 2022-07-29 NOTE — Telephone Encounter (Signed)
Patient called. She would like an appointment with Dr. Newton.  

## 2022-07-31 ENCOUNTER — Telehealth: Payer: Self-pay | Admitting: Physical Medicine and Rehabilitation

## 2022-07-31 NOTE — Telephone Encounter (Signed)
Patient called needing to schedule an appointment with Dr Alvester Morin for an injection in her back. The number to contact patient IS [phone]

## 2022-07-31 NOTE — Telephone Encounter (Signed)
Spoke with patient and she doesn't know if she is having back pain or hip pain on the left. Scheduled OV for 07/31/22

## 2022-08-01 NOTE — Telephone Encounter (Signed)
See previous encounter

## 2022-08-07 ENCOUNTER — Ambulatory Visit: Payer: Medicare Other | Admitting: Physical Medicine and Rehabilitation

## 2022-08-14 ENCOUNTER — Ambulatory Visit: Payer: Medicare Other | Admitting: Physical Medicine and Rehabilitation

## 2022-08-21 ENCOUNTER — Ambulatory Visit (HOSPITAL_COMMUNITY): Payer: Medicare Other

## 2022-08-26 ENCOUNTER — Ambulatory Visit: Payer: Medicare Other | Admitting: Orthopedic Surgery

## 2022-08-28 ENCOUNTER — Ambulatory Visit: Payer: Medicare Other | Admitting: Podiatry

## 2022-09-02 ENCOUNTER — Telehealth: Payer: Self-pay | Admitting: Physical Medicine and Rehabilitation

## 2022-09-02 NOTE — Telephone Encounter (Signed)
Patient called needing another shot in her back. CB#[phone]

## 2022-09-09 ENCOUNTER — Telehealth: Payer: Self-pay | Admitting: Physical Medicine and Rehabilitation

## 2022-09-09 NOTE — Telephone Encounter (Signed)
Spoke with patient and scheduled OV for 09/13/22.

## 2022-09-09 NOTE — Telephone Encounter (Signed)
Pt request call for an appt for an injection in her back and she needs to discuss further.  Pt do not want calls on Tuesday or Thursday

## 2022-09-13 ENCOUNTER — Ambulatory Visit: Payer: Medicare Other | Admitting: Physical Medicine and Rehabilitation

## 2022-09-13 ENCOUNTER — Encounter: Payer: Self-pay | Admitting: Physical Medicine and Rehabilitation

## 2022-09-13 VITALS — BP 137/64 | HR 63

## 2022-09-13 DIAGNOSIS — M25552 Pain in left hip: Secondary | ICD-10-CM

## 2022-09-13 DIAGNOSIS — R269 Unspecified abnormalities of gait and mobility: Secondary | ICD-10-CM | POA: Diagnosis not present

## 2022-09-13 DIAGNOSIS — M5416 Radiculopathy, lumbar region: Secondary | ICD-10-CM | POA: Diagnosis not present

## 2022-09-13 NOTE — Progress Notes (Unsigned)
NHIA MCNICHOL - 67 y.o. female MRN [id number]  Date of birth: 03-21-1955  Office Visit Note: Visit Date: 09/13/2022 PCP: Andi Devon, MD Referred by: Andi Devon, MD  Subjective: Chief Complaint  Patient presents with   Lower Back - Pain   Left Leg - Pain   HPI: Chelsey Lee is a 67 y.o. female who comes in today for evaluation of chronic, worsening and severe left lower back pain radiating to buttock, groin and anterior thigh. Pain ongoing for several years, worsened over the last year. Her pain becomes severe with movement and activity. She reports severe lower back pain with prolonged standing and walking. She describes pain as sharp and stabbing sensation, currently rates as 8 out of 10. Some relief of pain with home exercise regimen, rest and use of medications. Lumbar MRI imaging from 2021 exhibits moderate to severe canal stenosis at L5-S1. There is also epidural lipomatosis contributing to increased mild to moderate spinal stenosis at L2-L3 and L3-L4. Left hip x-rays from 2021 show moderate osteoarthritis. Prior history of left L5-S1 interlaminar epidural steroid injection in our office in 2020, and left intra-articular hip injection in 2022. She reports most significant pain relief with lumbar epidural steroid injection. Per Dr. Higden Blas prior office notes her symptoms seem to fit more with left hip pain, less likely classic claudication symptoms. She was previously managed from orthopedic standpoint by Dr. Aldean Baker. Patient currently ambulates with cane. She denies focal weakness, numbness and tingling. No recent trauma or falls.     Review of Systems  Musculoskeletal:  Positive for back pain and joint pain.  Neurological:  Negative for tingling, sensory change, focal weakness and weakness.  All other systems reviewed and are negative.  Otherwise per HPI.  Assessment & Plan: Visit Diagnoses:    ICD-10-CM   1. Lumbar radiculopathy  M54.16 MR LUMBAR SPINE WO  CONTRAST    2. Pain in left hip  M25.552 MR LUMBAR SPINE WO CONTRAST    3. Abnormality of gait  R26.9        Plan: Findings:  Chronic, worsening and severe left lower back pain radiating to buttock, groin and anterior thigh. Patient continues to have severe pain despite good conservative therapies such as home exercise regimen, rest and use of medications. Patients clinical presentation and exam are complex, differentials include lumbar radiculopathy vs intrinsic left hip pain. More of L3 nerve pattern. Her symptoms do not fit with classic claudication, however central stenosis could be worse compared to 2021 lumbar MRI imaging. Next step is to obtain new lumbar MRI imaging. Depending on results of imaging we would consider lumbar epidural steroid injection. If we feel this is more left hip issue, will refer back to Dr. Lajoyce Corners for re-evaluation. Patient has no questions at this time. No red flag symptoms noted upon exam today.     Meds & Orders: No orders of the defined types were placed in this encounter.   Orders Placed This Encounter  Procedures   MR LUMBAR SPINE WO CONTRAST    Follow-up: Return for lumbar MRI review.   Procedures: No procedures performed      Clinical History: Narrative & Impression CLINICAL DATA:  67 year old female with low back pain radiating to the buttocks and the left leg. Progressive symptoms for 2 months with no known injury. Spinal injection 1 month ago.   EXAM: MRI LUMBAR SPINE WITHOUT CONTRAST   TECHNIQUE: Multiplanar, multisequence MR imaging of the lumbar spine was performed. No intravenous contrast  was administered.   COMPARISON:  Lumbar MRI 04/05/2017. Lumbar radiographs 03/13/2015.   FINDINGS: Segmentation: Normal on the comparison radiographs which is the same numbering system used in 2019.   Alignment: Stable since 2019. Chronic grade 1 anterolisthesis of L4 on L5 with mild straightening of lumbar lordosis elsewhere.   Vertebrae:  Chronic degenerative endplate marrow edema at L5-S1. Underlying chronic degenerative endplate marrow signal changes there and at L4-L5. Chronic vacuum disc at both of those levels. Intact visible sacrum and SI joints. No other acute osseous abnormality identified.   Conus medullaris and cauda equina: Conus extends to the T12-L1 level. No lower spinal cord or conus signal abnormality.   Paraspinal and other soft tissues: Negative.   Disc levels:   Mild visible lower thoracic spinal stenosis related to disc space loss with disc bulging and posterior element hypertrophy. This appears not significantly changed since 2019, mild at T10-T11 and T12-L1. There is chronic associated bilateral T11 foraminal stenosis which appears moderate.   T12-L1: Negative and evidence of interbody ankylosis via a right lateral flowing endplate osteophyte on series 6, image 4.   L1-L2: Flowing anterior endplate osteophyte although unclear whether there is interbody ankylosis at this level. There is mild far lateral disc bulging but no significant stenosis.   L2-L3: Mild circumferential disc bulge and posterior element hypertrophy. Increased epidural lipomatosis and mild spinal stenosis at this level since 2019. Mild left L2 foraminal stenosis appears stable.   L3-L4: Chronic circumferential disc bulge and moderate posterior element hypertrophy. Chronic degenerative facet joint fluid at this level. Increased epidural lipomatosis and mild to moderate spinal stenosis since 2019. Mild to moderate bilateral L3 foraminal stenosis is greater on the left and appears stable.   L4-L5: Chronic severe disc space loss in the setting of grade 1 anterolisthesis. Bulky circumferential disc osteophyte complex and facet hypertrophy. Chronic moderate to severe spinal and bilateral lateral recess stenosis. Chronic mild to moderate left and moderate to severe right L4 foraminal stenosis. This level appears stable.    L5-S1: Chronic severe disc degeneration with vacuum disc, disc space loss and bulky circumferential disc osteophyte complex. Mild to moderate facet hypertrophy. No significant spinal stenosis but moderate to severe chronic lateral recess stenosis greater on the left (S1 nerve levels). Moderate to severe left greater than right L5 foraminal stenosis. This level is stable.   IMPRESSION: 1. Epidural lipomatosis contributing to increased mild to moderate spinal stenosis since 2019 at L2-L3 and L3-L4. 2. Chronic severe disc and endplate degeneration at L4-L5 and L5-S1 appears stable along with: -L4-L5 grade 1 anterolisthesis, moderate to severe spinal, lateral recess, and right foraminal stenosis. -L5-S1 left greater than right moderate to severe lateral recess and bilateral foraminal stenosis. Chronic 3. Mild lower thoracic spinal stenosis at T10-T11 and T11-T12 appears stable. Evidence of adjacent T12-L1 interbody ankylosis.     Electronically Signed   By: Odessa Fleming M.D.   On: 02/04/2019 20:5   She reports that she has never smoked. She has never used smokeless tobacco.  Recent Labs    02/08/22 1245  HGBA1C 7.3*    Objective:  VS:  HT:    WT:   BMI:     BP:137/64  HR:63bpm  TEMP: ( )  RESP:  Physical Exam Vitals and nursing note reviewed.  HENT:     Head: Normocephalic and atraumatic.     Right Ear: External ear normal.     Left Ear: External ear normal.     Nose: Nose normal.  Mouth/Throat:     Mouth: Mucous membranes are moist.  Eyes:     Extraocular Movements: Extraocular movements intact.  Cardiovascular:     Rate and Rhythm: Normal rate.     Pulses: Normal pulses.  Pulmonary:     Effort: Pulmonary effort is normal.  Abdominal:     General: Abdomen is flat. There is no distension.  Musculoskeletal:        General: Tenderness present.     Cervical back: Normal range of motion.     Comments: Patient rises from seated position to standing without  difficulty. Good lumbar range of motion. No pain noted with facet loading. 5/5 strength noted with bilateral hip flexion, knee flexion/extension, ankle dorsiflexion/plantarflexion and EHL. No clonus noted bilaterally. No pain upon palpation of greater trochanters. No pain with internal/external rotation of bilateral hips. Sensation intact bilaterally. Dysesthesias noted to left L3 dermatome. Negative slump test bilaterally. Ambulates without aid, gait steady.     Skin:    General: Skin is warm and dry.     Capillary Refill: Capillary refill takes less than 2 seconds.  Neurological:     Mental Status: She is alert and oriented to person, place, and time.     Gait: Gait abnormal.  Psychiatric:        Mood and Affect: Mood normal.        Behavior: Behavior normal.     Ortho Exam  Imaging: No results found.  Past Medical/Family/Surgical/Social History: Medications & Allergies reviewed per EMR, new medications updated. Patient Active Problem List   Diagnosis Date Noted   OA (osteoarthritis) of knee 02/08/2022   Total knee replacement status, left 02/08/2022   Pain in left hand 06/29/2021   Cerebrovascular disease 05/11/2020   Gout 03/09/2020   Allergic rhinitis 01/25/2020   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   Colon cancer screening 01/25/2020   Gastroesophageal reflux disease without esophagitis 01/25/2020   Hyperlipidemia 01/25/2020   Morbid obesity ([facility]) 01/25/2020   Personal history of colonic polyps 01/25/2020   OSA (obstructive sleep apnea) 01/25/2020   Vitamin D deficiency 01/25/2020   Controlled type 2 diabetes mellitus without complication ([facility]) 11/10/2019   Chronic kidney disease, stage 4 (severe) ([facility]) 06/29/2019   Long term (current) use of insulin ([facility]) 06/29/2019   Epidural lipomatosis 03/02/2019   Spondylolisthesis of lumbar region 03/02/2019   Spinal stenosis of lumbar region with neurogenic claudication 03/02/2019   Left wrist tendinitis 09/07/2018    Unilateral primary osteoarthritis, left knee 04/03/2018   Celiac disease 08/21/2016   Abnormal thyroid function test 04/01/2016   Pain in thumb joint with movement of right hand 09/14/2015   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   Moderate major depression, single episode ([facility]) 02/02/2010   Diabetic renal disease ([facility]) 06/07/2009   Anemia 01/18/2009   Essential hypertension 01/18/2009   Past Medical History:  Diagnosis Date   Arthritis    Diabetes mellitus    ESRD on HD    T Th Sa   GERD (gastroesophageal reflux disease)    Hyperlipemia    Hypertension    Seasonal allergies    Sleep apnea    No family history on file. Past Surgical History:  Procedure Laterality Date   AV FISTULA PLACEMENT Left 04/10/2020   Procedure: LEFT ARM ARTERIOVENOUS (AV) FISTULA CREATION;  Surgeon: Sherren Kerns, MD;  Location[facility] OR;  Service: Vascular;  Laterality: Left;   CAPSULOTOMY  01/13/2012   Procedure: MINOR CAPSULOTOMY;  Surgeon: Vita Erm., MD;  Location:  [facility] OR;  Service: Ophthalmology;  Laterality: Left;   DORSAL COMPARTMENT RELEASE  01/22/2007   rt    FISTULA SUPERFICIALIZATION Left 05/29/2020   Procedure: LEFT UPPER ARM FISTULA SUPERFICIALIZATION WITH LIGATION OF MULTIPLE SIDE BRANCHES;  Surgeon: Sherren Kerns, MD;  Location: [facility] OR;  Service: Vascular;  Laterality: Left;   KNEE ARTHROSCOPY  02/21/2002   right and left   STERIOD INJECTION  08/01/2011   Procedure: STEROID INJECTION;  Surgeon: Wyn Forster., MD;  Location: [facility];  Service: Orthopedics;  Laterality: Left;  cmc left thumb   TONSILLECTOMY     TOTAL KNEE ARTHROPLASTY Left 02/08/2022   Procedure: LEFT TOTAL KNEE ARTHROPLASTY;  Surgeon: Nadara Mustard, MD;  Location: [facility] OR;  Service: Orthopedics;  Laterality: Left;   TRIGGER FINGER RELEASE  01/22/2004   rt thumb   TRIGGER FINGER RELEASE  08/01/2011   Procedure: RELEASE TRIGGER FINGER/A-1 PULLEY;  Surgeon: Wyn Forster., MD;  Location:  [facility];  Service: Orthopedics;  Laterality: Left;  left thumb   YAG LASER APPLICATION  01/13/2012   Procedure: YAG LASER APPLICATION;  Surgeon: Vita Erm., MD;  Location: [facility] OR;  Service: Ophthalmology;  Laterality: Left;   Social History   Occupational History   Not on file  Tobacco Use   Smoking status: Never   Smokeless tobacco: Never  Vaping Use   Vaping status: Never Used  Substance and Sexual Activity   Alcohol use: No   Drug use: No   Sexual activity: Not on file

## 2022-09-13 NOTE — Progress Notes (Unsigned)
Functional Pain Scale - descriptive words and definitions   Severe (9)  Cannot do any ADL's even with assistance can barely talk/unable to sleep and unable to use distraction. Severe range order  Average Pain 6  F/U visit, pain has improved since last visit. Still has pain in lower back and into left leg.

## 2022-09-15 NOTE — Progress Notes (Signed)
HPI F never smoker followed for OSA, complicated by  HTN, Cerebrovascular Disease, Allergic Rhinitis, Celiac Disease, GERD, DM2, Goiter, Epidural Lipomatosis, CKD4/ ESRD/Dialysis, Anemia, Gout, Osteoarthritis,  NPSG 04/04/15 AHI 24.4/ hr, desaturation to 77%, Body weight 278 lbs  ==============================================================   09/10/21- 65 yoF never smoker followed for OSA, complicated by  HTN, Cerebrovascular Disease, Allergic Rhinitis, Celiac Disease, GERD, DM2, Goiter, Epidural Lipomatosis, CKD4/ ESRD/Dialysis, Anemia, Gout, Osteoarthritis,  CPAP  11 / [employer]    AirSense10 AutoSet           replacement was ordered, but denied by insurance until her machine is "broken beyond repair" Download compliance- 100%, AHI 1.5/ hr Body weight-272 lbs Covid vax- -----Pt f/u on OSA, pt states CPAP is working well unfortunately she is having issues w/ the newer mask Download reviewed.  She says her new mask slides on her face too much.  She agrees to a repeat mask fitting. Now on dialysis. Still pending knee replacement  09/16/22- 67 yoF never smoker followed for OSA, complicated by  HTN, Cerebrovascular Disease, Allergic Rhinitis, Celiac Disease, GERD, DM2, Goiter, Epidural Lipomatosis, CKD4/ ESRD/Dialysis, Anemia, Gout, Osteoarthritis,  CPAP  11 / [employer]    AirSense10 AutoSet       replacement was ordered, but denied by insurance until her machine is "broken beyond repair" Download compliance- 10%, AHI 1.4/ hr Body weight-264 lbs -----States she has not been using cpap machine. Had Coivd in June and stopped using then. She was curious about CPAP alternatives, especially Inspire which we discussed.  She is too heavy for now.  We are going to refer her so that she can learn more about an oral appliance alternative.  Meanwhile I have encouraged her to resume using CPAP. She continues dialysis.  Breathing is comfortable.  ROS    += positive Constitutional:    weight loss, night sweats,  fevers, chills, fatigue, lassitude. HEENT:    headaches, difficulty swallowing, tooth/dental problems, sore throat,       sneezing, itching, ear ache, nasal congestion, post nasal drip, snoring CV:    chest pain, orthopnea, PND, swelling in lower extremities, anasarca,                                   dizziness, palpitations Resp:   shortness of breath with exertion or at rest.                productive cough,   non-productive cough, coughing up of blood.              change in color of mucus.  wheezing.   Skin:    rash or lesions. GI:  No-   heartburn, indigestion, abdominal pain, nausea, vomiting, diarrhea,                 change in bowel habits, loss of appetite GU: dysuria, change in color of urine, no urgency or frequency.   flank pain. MS:   joint pain, stiffness, decreased range of motion, back pain. Neuro-     nothing unusual Psych:  change in mood or affect.  depression or anxiety.   memory loss.  OBJ- Physical Exam General- Alert, Oriented, Affect-appropriate, Distress- none acute, +obese Skin- rash-none, lesions- none, excoriation- none Lymphadenopathy- none Head- atraumatic            Eyes- Gross vision intact, PERRLA, conjunctivae and secretions clear  Ears- Hearing, canals-normal            Nose- Clear, no-Septal dev, mucus, polyps, erosion, perforation             Throat- Mallampati IV , mucosa clear , drainage- none, tonsils absent, +teeth Neck- flexible , trachea midline, no stridor , thyroid nl, carotid no bruit Chest - symmetrical excursion , unlabored           Heart/CV- RRR , no murmur , no gallop  , no rub, nl s1 s2                           - JVD- none , edema- none, stasis changes- none, varices- none           Lung- clear to P&A, wheeze- none, cough- none , dullness-none, rub- none           Chest wall-  Abd-  Br/ Gen/ Rectal- Not done, not indicated Extrem-+access L arm Neuro- grossly intact to observation

## 2022-09-16 ENCOUNTER — Encounter: Payer: Self-pay | Admitting: Internal Medicine

## 2022-09-16 ENCOUNTER — Ambulatory Visit (INDEPENDENT_AMBULATORY_CARE_PROVIDER_SITE_OTHER): Payer: Medicare Other | Admitting: Internal Medicine

## 2022-09-16 VITALS — BP 124/60 | HR 85 | Ht 66.0 in | Wt 264.2 lb

## 2022-09-16 DIAGNOSIS — G4733 Obstructive sleep apnea (adult) (pediatric): Secondary | ICD-10-CM | POA: Diagnosis not present

## 2022-09-16 DIAGNOSIS — N184 Chronic kidney disease, stage 4 (severe): Secondary | ICD-10-CM

## 2022-09-16 NOTE — Assessment & Plan Note (Signed)
She continues to hemodialysis with access left upper arm.

## 2022-09-16 NOTE — Assessment & Plan Note (Signed)
Weight loss would open other options for managing OSA- encouraged.

## 2022-09-16 NOTE — Patient Instructions (Addendum)
I recommend that you start back on CPAP for now. In the future, if you can lose some weight, we can talk again about Inspire surgery as an alternative.  Order- referral to Dr Myrtis Ser, orthodontist- consider oral appliance for OSA

## 2022-09-16 NOTE — Assessment & Plan Note (Signed)
She dropped off CPAP during COVID infection in June.  I have encouraged her to resume but meanwhile we plan referral so that she can learn about oral appliance therapy as an alternative to CPAP.

## 2022-09-18 ENCOUNTER — Ambulatory Visit (INDEPENDENT_AMBULATORY_CARE_PROVIDER_SITE_OTHER): Payer: Medicare Other | Admitting: Orthopedic Surgery

## 2022-09-18 ENCOUNTER — Other Ambulatory Visit (INDEPENDENT_AMBULATORY_CARE_PROVIDER_SITE_OTHER): Payer: Medicare Other

## 2022-09-18 DIAGNOSIS — M79642 Pain in left hand: Secondary | ICD-10-CM

## 2022-09-18 DIAGNOSIS — M79641 Pain in right hand: Secondary | ICD-10-CM

## 2022-09-18 DIAGNOSIS — M19049 Primary osteoarthritis, unspecified hand: Secondary | ICD-10-CM

## 2022-09-18 NOTE — Progress Notes (Signed)
Chelsey Lee - 67 y.o. female MRN [id number]  Date of birth: 1955/04/22  Office Visit Note: Visit Date: 09/18/2022 PCP: Andi Devon, MD Referred by: Andi Devon, MD  Subjective: Chief Complaint  Patient presents with   Right Hand - Pain   Left Hand - Pain   HPI: BIBIANNA Lee is a pleasant 67 y.o. female who presents today for evaluation of ongoing left hand small finger pain at the DIP as well as associated right hand long finger pain and swelling at the DIP.  Both issues are chronic in nature.  She did have an old injury to the left hand small finger which sounds like a mallet type injury approximately-1 year prior.  Thinks she might have injured the right long finger previously with a crush injury as well, however she is unsure.  Pertinent ROS were reviewed with the patient and found to be negative unless otherwise specified above in HPI.   Visit Reason: left small finger/ right middle finger Hand dominance: right Occupation: retired Diabetic: Yes/ 7.3 Heart/Lung History: no   Blood Thinners:  no   Prior Testing/EMG: no Injections (Date): none Treatments: none Prior Surgery: none   Left small looks to be like a mallet finger Right middle is swollen   Assessment & Plan: Visit Diagnoses:  1. Pain in left hand   2. Pain in right hand     Plan: Extensive discussion was had the patient today regarding her ongoing complaints to the left small finger and right long finger DIP joints.  X-rays reviewed today which show significant degenerative arthritic change at the DIP joints of both of these digits.  Her left small finger does appear to be posttraumatic in nature from a prior mallet injury.  The right long finger also does appear to be posttraumatic as this is much more degenerated than the other digits seen on the x-ray.  From a treatment standpoint, we did discuss conservative measures such as Coban wrapping and splinting as needed.  I demonstrated for  her Coban wrapping today which she like particular for the right long finger DIP joint.  From a surgical perspective, she would be an appropriate candidate for DIP fusion to both the right long finger and left small finger in the future should her symptoms become more severe or refractory to any conservative care.  Risks and benefits of the procedure were discussed, risks including but not limited to infection, bleeding, scarring, stiffness, nerve injury, vascular injury, hardware complication, malunion, nonunion recurrence of symptoms and need for subsequent operation.  Patient expressed understanding.    She expressed understanding of this, will return to me in the future if and when she is ready for potential fusion discussion for either the right long finger DIP or left small finger DIP.  Follow-up: No follow-ups on file.   Meds & Orders: No orders of the defined types were placed in this encounter.   Orders Placed This Encounter  Procedures   XR Hand Complete Right   XR Hand Complete Left     Procedures: No procedures performed      Clinical History: Narrative & Impression CLINICAL DATA:  67 year old female with low back pain radiating to the buttocks and the left leg. Progressive symptoms for 2 months with no known injury. Spinal injection 1 month ago.   EXAM: MRI LUMBAR SPINE WITHOUT CONTRAST   TECHNIQUE: Multiplanar, multisequence MR imaging of the lumbar spine was performed. No intravenous contrast was administered.   COMPARISON:  Lumbar MRI 04/05/2017. Lumbar radiographs 03/13/2015.   FINDINGS: Segmentation: Normal on the comparison radiographs which is the same numbering system used in 2019.   Alignment: Stable since 2019. Chronic grade 1 anterolisthesis of L4 on L5 with mild straightening of lumbar lordosis elsewhere.   Vertebrae: Chronic degenerative endplate marrow edema at L5-S1. Underlying chronic degenerative endplate marrow signal changes there and at  L4-L5. Chronic vacuum disc at both of those levels. Intact visible sacrum and SI joints. No other acute osseous abnormality identified.   Conus medullaris and cauda equina: Conus extends to the T12-L1 level. No lower spinal cord or conus signal abnormality.   Paraspinal and other soft tissues: Negative.   Disc levels:   Mild visible lower thoracic spinal stenosis related to disc space loss with disc bulging and posterior element hypertrophy. This appears not significantly changed since 2019, mild at T10-T11 and T12-L1. There is chronic associated bilateral T11 foraminal stenosis which appears moderate.   T12-L1: Negative and evidence of interbody ankylosis via a right lateral flowing endplate osteophyte on series 6, image 4.   L1-L2: Flowing anterior endplate osteophyte although unclear whether there is interbody ankylosis at this level. There is mild far lateral disc bulging but no significant stenosis.   L2-L3: Mild circumferential disc bulge and posterior element hypertrophy. Increased epidural lipomatosis and mild spinal stenosis at this level since 2019. Mild left L2 foraminal stenosis appears stable.   L3-L4: Chronic circumferential disc bulge and moderate posterior element hypertrophy. Chronic degenerative facet joint fluid at this level. Increased epidural lipomatosis and mild to moderate spinal stenosis since 2019. Mild to moderate bilateral L3 foraminal stenosis is greater on the left and appears stable.   L4-L5: Chronic severe disc space loss in the setting of grade 1 anterolisthesis. Bulky circumferential disc osteophyte complex and facet hypertrophy. Chronic moderate to severe spinal and bilateral lateral recess stenosis. Chronic mild to moderate left and moderate to severe right L4 foraminal stenosis. This level appears stable.   L5-S1: Chronic severe disc degeneration with vacuum disc, disc space loss and bulky circumferential disc osteophyte complex. Mild  to moderate facet hypertrophy. No significant spinal stenosis but moderate to severe chronic lateral recess stenosis greater on the left (S1 nerve levels). Moderate to severe left greater than right L5 foraminal stenosis. This level is stable.   IMPRESSION: 1. Epidural lipomatosis contributing to increased mild to moderate spinal stenosis since 2019 at L2-L3 and L3-L4. 2. Chronic severe disc and endplate degeneration at L4-L5 and L5-S1 appears stable along with: -L4-L5 grade 1 anterolisthesis, moderate to severe spinal, lateral recess, and right foraminal stenosis. -L5-S1 left greater than right moderate to severe lateral recess and bilateral foraminal stenosis. Chronic 3. Mild lower thoracic spinal stenosis at T10-T11 and T11-T12 appears stable. Evidence of adjacent T12-L1 interbody ankylosis.     Electronically Signed   By: Odessa Fleming M.D.   On: 02/04/2019 20:5  She reports that she has never smoked. She has never used smokeless tobacco.  Recent Labs    02/08/22 1245  HGBA1C 7.3*    Objective:   Vital Signs: There were no vitals taken for this visit.  Physical Exam  Gen: Well-appearing, in no acute distress; non-toxic CV: Regular Rate. Well-perfused. Warm.  Resp: Breathing unlabored on room air; no wheezing. Psych: Fluid speech in conversation; appropriate affect; normal thought process  Ortho Exam Left hand: - Small finger with slightly flexed posture of the DIP, associated pain with deep palpation, there is mild swelling notable, DIP motion small  finger is approximately 5-15 when isolated  Right hand: - Long finger with significant pain to palpation, notable swelling, minimal range of motion approximately 0-10 degrees  Imaging: XR Hand Complete Right  Result Date: 09/18/2022 X-rays of the right hand, multiple views were obtained today X-rays demonstrate significant degenerative change to the long finger DIP joint with joint space obliteration, significant osteophyte  formation with sclerosis of the middle phalanx  XR Hand Complete Left  Result Date: 09/18/2022 X-rays of the left hand multiple views were completed today X-rays demonstrate significant degenerative change at the DIP joints diffusely, most prominent at the small finger DIP.  There is near joint obliteration of the small finger DIP with slightly flexed posture of the distal phalanx and osteophyte formation.   Past Medical/Family/Surgical/Social History: Medications & Allergies reviewed per EMR, new medications updated. Patient Active Problem List   Diagnosis Date Noted   OA (osteoarthritis) of knee 02/08/2022   Total knee replacement status, left 02/08/2022   Pain in left hand 06/29/2021   Cerebrovascular disease 05/11/2020   Gout 03/09/2020   Allergic rhinitis 01/25/2020   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   Colon cancer screening 01/25/2020   Gastroesophageal reflux disease without esophagitis 01/25/2020   Hyperlipidemia 01/25/2020   Morbid obesity (HCC) 01/25/2020   Personal history of colonic polyps 01/25/2020   OSA (obstructive sleep apnea) 01/25/2020   Vitamin D deficiency 01/25/2020   Controlled type 2 diabetes mellitus without complication (HCC) 11/10/2019   Chronic kidney disease, stage 4 (severe) (HCC) 06/29/2019   Long term (current) use of insulin (HCC) 06/29/2019   Epidural lipomatosis 03/02/2019   Spondylolisthesis of lumbar region 03/02/2019   Spinal stenosis of lumbar region with neurogenic claudication 03/02/2019   Left wrist tendinitis 09/07/2018   Unilateral primary osteoarthritis, left knee 04/03/2018   Celiac disease 08/21/2016   Abnormal thyroid function test 04/01/2016   Pain in thumb joint with movement of right hand 09/14/2015   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   Moderate major depression, single episode (HCC) 02/02/2010   Diabetic renal disease (HCC) 06/07/2009   Anemia 01/18/2009   Essential hypertension 01/18/2009   Past Medical History:   Diagnosis Date   Arthritis    Diabetes mellitus    ESRD on HD    T Th Sa   GERD (gastroesophageal reflux disease)    Hyperlipemia    Hypertension    Seasonal allergies    Sleep apnea    No family history on file. Past Surgical History:  Procedure Laterality Date   AV FISTULA PLACEMENT Left 04/10/2020   Procedure: LEFT ARM ARTERIOVENOUS (AV) FISTULA CREATION;  Surgeon: Sherren Kerns, MD;  Location: [facility] OR;  Service: Vascular;  Laterality: Left;   CAPSULOTOMY  01/13/2012   Procedure: MINOR CAPSULOTOMY;  Surgeon: Vita Erm., MD;  Location: [facility] OR;  Service: Ophthalmology;  Laterality: Left;   DORSAL COMPARTMENT RELEASE  01/22/2007   rt    FISTULA SUPERFICIALIZATION Left 05/29/2020   Procedure: LEFT UPPER ARM FISTULA SUPERFICIALIZATION WITH LIGATION OF MULTIPLE SIDE BRANCHES;  Surgeon: Sherren Kerns, MD;  Location: [facility] OR;  Service: Vascular;  Laterality: Left;   KNEE ARTHROSCOPY  02/21/2002   right and left   STERIOD INJECTION  08/01/2011   Procedure: STEROID INJECTION;  Surgeon: Wyn Forster., MD;  Location: [facility];  Service: Orthopedics;  Laterality: Left;  [facility] left thumb   TONSILLECTOMY     TOTAL KNEE ARTHROPLASTY Left 02/08/2022   Procedure: LEFT TOTAL KNEE ARTHROPLASTY;  Surgeon: Nadara Mustard, MD;  Location: [facility] OR;  Service: Orthopedics;  Laterality: Left;   TRIGGER FINGER RELEASE  01/22/2004   rt thumb   TRIGGER FINGER RELEASE  08/01/2011   Procedure: RELEASE TRIGGER FINGER/A-1 PULLEY;  Surgeon: Wyn Forster., MD;  Location: [facility];  Service: Orthopedics;  Laterality: Left;  left thumb   YAG LASER APPLICATION  01/13/2012   Procedure: YAG LASER APPLICATION;  Surgeon: Vita Erm., MD;  Location: [facility] OR;  Service: Ophthalmology;  Laterality: Left;   Social History   Occupational History   Not on file  Tobacco Use   Smoking status: Never   Smokeless tobacco: Never  Vaping Use   Vaping status:  Never Used  Substance and Sexual Activity   Alcohol use: No   Drug use: No   Sexual activity: Not on file    Zinia Innocent Trevor Mace, M.D. [facility] 11:19 AM

## 2022-09-24 ENCOUNTER — Encounter: Payer: Self-pay | Admitting: Internal Medicine

## 2022-09-27 ENCOUNTER — Other Ambulatory Visit: Payer: Self-pay | Admitting: Orthopedic Surgery

## 2022-09-27 ENCOUNTER — Telehealth: Payer: Self-pay | Admitting: Orthopedic Surgery

## 2022-09-27 MED ORDER — HYDROCODONE-ACETAMINOPHEN 5-325 MG PO TABS: 1.0000 | ORAL_TABLET | ORAL | 0 refills | Status: DC | PRN

## 2022-09-27 NOTE — Telephone Encounter (Signed)
Pt has been having pain in her right knee. S/p left TKA. She knows she may need surgery for right knee. She is requesting refill of hydrocodone? Last refill 05/2022.

## 2022-09-27 NOTE — Telephone Encounter (Signed)
Pt requesting refill on Hydrocodone for right knee please advise

## 2022-10-13 ENCOUNTER — Ambulatory Visit
Admission: RE | Admit: 2022-10-13 | Discharge: 2022-10-13 | Disposition: A | Discharge status: Home / Self Care | Payer: Medicare Other | Source: Ambulatory Visit | Attending: Physical Medicine and Rehabilitation | Admitting: Physical Medicine and Rehabilitation

## 2022-10-13 DIAGNOSIS — M5416 Radiculopathy, lumbar region: Secondary | ICD-10-CM

## 2022-10-13 DIAGNOSIS — M25552 Pain in left hip: Secondary | ICD-10-CM

## 2022-10-16 ENCOUNTER — Telehealth: Payer: Self-pay | Admitting: Physical Medicine and Rehabilitation

## 2022-10-16 NOTE — Telephone Encounter (Signed)
Patient called and wanted to make an appt for xrays and to see Dr. Alvester Morin. CB#[phone]

## 2022-10-18 NOTE — Telephone Encounter (Signed)
LVM to inform patient that results of MRI are taking 14-17 days to get back to [place]. Will call to give results or schedule OV at that time

## 2022-11-01 ENCOUNTER — Telehealth: Payer: Self-pay | Admitting: Physical Medicine and Rehabilitation

## 2022-11-01 NOTE — Telephone Encounter (Signed)
Pt had questions about follow up after x rays please advise, pt said she is hurting

## 2022-11-01 NOTE — Telephone Encounter (Signed)
Spoke with patient and informed her that the MRI has not been resulted and we will give her a call once it does

## 2022-11-06 ENCOUNTER — Telehealth: Payer: Self-pay | Admitting: Physical Medicine and Rehabilitation

## 2022-11-06 NOTE — Telephone Encounter (Signed)
Spoke with patient and scheduled OV for 11/08/22.

## 2022-11-06 NOTE — Telephone Encounter (Signed)
Patient called and said she is waiting on her xrays and she is in pain in her hip and back. CB#[phone]

## 2022-11-08 ENCOUNTER — Encounter: Payer: Self-pay | Admitting: Physical Medicine and Rehabilitation

## 2022-11-08 ENCOUNTER — Ambulatory Visit: Payer: Medicare Other | Admitting: Physical Medicine and Rehabilitation

## 2022-11-08 ENCOUNTER — Telehealth: Payer: Self-pay

## 2022-11-08 DIAGNOSIS — M5442 Lumbago with sciatica, left side: Secondary | ICD-10-CM | POA: Diagnosis not present

## 2022-11-08 DIAGNOSIS — M5416 Radiculopathy, lumbar region: Secondary | ICD-10-CM

## 2022-11-08 DIAGNOSIS — G8929 Other chronic pain: Secondary | ICD-10-CM | POA: Diagnosis not present

## 2022-11-08 DIAGNOSIS — M48062 Spinal stenosis, lumbar region with neurogenic claudication: Secondary | ICD-10-CM

## 2022-11-08 NOTE — Progress Notes (Signed)
Chelsey Lee - 67 y.o. female MRN [id number]  Date of birth: Aug 16, 1955  Office Visit Note: Visit Date: 11/08/2022 PCP: Andi Devon, MD Referred by: Andi Devon, MD  Subjective: Chief Complaint  Patient presents with   Lower Back - Pain   HPI: Chelsey Lee is a 67 y.o. female who comes in today for evaluation of chronic, worsening and severe left lower back pain radiating to buttock, groin and anterior thigh. Pain ongoing for several years, worsened over the last year. Her pain becomes severe with standing, walking and activity. Sitting helps to alleviate her pain. She describes pain as sharp and stabbing sensation, currently rates as 8 out of 10. Also reports numbness/tingling to bilateral upper legs. Recent lumbar MRI imaging exhibits moderate multi level spinal canal stenosis at L3-L4 and L4-L5, moderate to severe bilateral foraminal stenosis at L5-S1. Prior history of left L5-S1 interlaminar epidural steroid injection in our office in 2020, and left intra-articular hip injection in 2022. Most significant pain relief with lumbar epidural steroid injection. She was previously managed from orthopedic standpoint by Dr. Aldean Baker. History of left total knee arthroplasty in January of this year. Patient currently ambulates with cane. She denies focal weakness, numbness and tingling. No recent trauma or falls.       Review of Systems  Musculoskeletal:  Positive for back pain.  Neurological:  Positive for tingling. Negative for focal weakness and weakness.  All other systems reviewed and are negative.  Otherwise per HPI.  Assessment & Plan: Visit Diagnoses:    ICD-10-CM   1. Lumbar radiculopathy  M54.16 Ambulatory referral to Physical Medicine Rehab    2. Chronic left-sided low back pain with left-sided sciatica  M54.42 Ambulatory referral to Physical Medicine Rehab   G89.29     3. Spinal stenosis of lumbar region with neurogenic claudication  M48.062 Ambulatory  referral to Physical Medicine Rehab       Plan: Findings:  Left lower back pain radiating to buttock, groin and anterior thigh. I discussed recent lumbar MRI with her today using images and spine model. Patient continues to have severe pain despite good conservative therapies such as home exercise regimen, rest and use of medications. Patient continues to have severe pain despite good conservative therapies such as home exercise regimen, rest and use of medications. Patients clinical presentation and exam are consistent with neurogenic claudication. There is moderate spinal canal stenosis at the level of L3-L4 on recent lumbar MRI imaging. Next step is to perform diagnostic and hopefully therapeutic left L3-L4 interlaminar epidurals steroid injection under fluoroscopic guidance. If good relief of pain with this injection we can repeat this procedure infrequently as needed. I discussed injection procedure with her in detail today, she has no questions at this time. No red flag symptoms noted upon exam today.     Meds & Orders: No orders of the defined types were placed in this encounter.   Orders Placed This Encounter  Procedures   Ambulatory referral to Physical Medicine Rehab    Follow-up: Return for Left L3-L4 interlaminar epidural steroid injection.   Procedures: No procedures performed      Clinical History: CLINICAL DATA:  Low back pain radiating into the left hip since 2022. No acute injury or prior relevant surgery. Previous injection.   EXAM: MRI LUMBAR SPINE WITHOUT CONTRAST   TECHNIQUE: Multiplanar, multisequence MR imaging of the lumbar spine was performed. No intravenous contrast was administered.   COMPARISON:  Lumbar MRI 02/04/2019   FINDINGS: Segmentation:  Conventional anatomy assumed, with the last open disc space designated L5-S1.Concurrent with prior imaging.   Alignment:  Stable chronic grade 1 anterolisthesis at L4-5.   Vertebrae: No worrisome osseous lesion,  acute fracture or pars defect. There are multilevel endplate degenerative changes in the thoracolumbar spine with evidence of interval interbody ankylosis at L4-5. Interval chronic appearing Schmorl's node in the superior endplate of L4. Ankylosing lower thoracic paraspinal osteophytes. Underlying congenitally short lumbar pedicles.   Conus medullaris: Extends to the L1 level and appears normal.   Paraspinal and other soft tissues: No significant paraspinal findings.   Disc levels:   Sagittal images demonstrate progressive multilevel spondylosis with ankylosing paraspinal osteophytes in the lower thoracic spine. There is mild right-sided foraminal narrowing at T10-11 and T11-12. No cord deformity.   L1-2: Mild disc bulging with anterior osteophytes. No significant spinal stenosis or foraminal narrowing.   L2-3: Preserved disc height with mild disc bulging and facet hypertrophy. No significant spinal stenosis or foraminal narrowing.   L3-4: Progressive loss of disc height with progressive annular disc bulging, facet and ligamentous hypertrophy. Resulting increased central canal stenosis, now moderate. There is mild to moderate left greater than right lateral recess and foraminal narrowing.   L4-5: Chronic spondylosis with interval interbody ankylosis. Advanced facet disease with probable interfacetal ankylosis. Resulting chronic moderate multifactorial spinal stenosis and moderate lateral recess and foraminal narrowing bilaterally are similar to the previous study.   L5-S1: Chronic loss of disc height with annular disc bulging and endplate osteophytes. Moderate bilateral facet hypertrophy. Mild spinal stenosis and mild lateral recess narrowing bilaterally. There is chronic moderate to severe foraminal narrowing bilaterally.   IMPRESSION: 1. Compared with previous MRI from 02/04/2019, there is progressive multilevel thoracolumbar spondylosis superimposed on a  congenitally small canal. Evidence for multilevel ankylosing osteophytes in the lower thoracic spine. 2. Progressive spondylosis at L3-4 with resulting moderate multifactorial spinal stenosis and mild to moderate left greater than right lateral recess and foraminal narrowing. 3. Chronic spondylosis at L4-5 with interval interbody and probable interfacetal ankylosis. Resulting chronic moderate multifactorial spinal stenosis and moderate lateral recess and foraminal narrowing bilaterally. 4. Chronic mild multifactorial spinal stenosis and moderate to severe foraminal narrowing bilaterally at L5-S1. 5. No acute findings or clear explanation for the patient's symptoms.     Electronically Signed   By: Carey Bullocks M.D.   On: 11/03/2022 14:51   She reports that she has never smoked. She has never used smokeless tobacco.  Recent Labs    02/08/22 1245  HGBA1C 7.3*    Objective:  VS:  HT:    WT:   BMI:     BP:   HR: bpm  TEMP: ( )  RESP:  Physical Exam Vitals and nursing note reviewed.  HENT:     Head: Normocephalic and atraumatic.     Right Ear: External ear normal.     Left Ear: External ear normal.     Nose: Nose normal.     Mouth/Throat:     Mouth: Mucous membranes are moist.  Eyes:     Extraocular Movements: Extraocular movements intact.  Cardiovascular:     Rate and Rhythm: Normal rate.     Pulses: Normal pulses.  Pulmonary:     Effort: Pulmonary effort is normal.  Abdominal:     General: Abdomen is flat. There is no distension.  Musculoskeletal:        General: Tenderness present.     Cervical back: Normal range of motion.  Comments: Patient is slow to rise from seated position to standing. Good lumbar range of motion. No pain noted with facet loading. 5/5 strength noted with bilateral hip flexion, knee flexion/extension, ankle dorsiflexion/plantarflexion and EHL. No clonus noted bilaterally. No pain upon palpation of greater trochanters. No pain with  internal/external rotation of bilateral hips. Sensation intact bilaterally. Negative slump test bilaterally. Ambulates with cane, gait slow and unsteady.     Skin:    General: Skin is warm and dry.     Capillary Refill: Capillary refill takes less than 2 seconds.  Neurological:     Mental Status: She is alert.     Gait: Gait abnormal.     Ortho Exam  Imaging: No results found.  Past Medical/Family/Surgical/Social History: Medications & Allergies reviewed per EMR, new medications updated. Patient Active Problem List   Diagnosis Date Noted   OA (osteoarthritis) of knee 02/08/2022   Total knee replacement status, left 02/08/2022   Pain in left hand 06/29/2021   Cerebrovascular disease 05/11/2020   Gout 03/09/2020   Allergic rhinitis 01/25/2020   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   Colon cancer screening 01/25/2020   Gastroesophageal reflux disease without esophagitis 01/25/2020   Hyperlipidemia 01/25/2020   Morbid obesity (HCC) 01/25/2020   History of colonic polyps 01/25/2020   OSA (obstructive sleep apnea) 01/25/2020   Vitamin D deficiency 01/25/2020   Controlled type 2 diabetes mellitus without complication (HCC) 11/10/2019   Chronic kidney disease, stage 4 (severe) (HCC) 06/29/2019   Long term (current) use of insulin (HCC) 06/29/2019   Epidural lipomatosis 03/02/2019   Spondylolisthesis of lumbar region 03/02/2019   Spinal stenosis of lumbar region with neurogenic claudication 03/02/2019   Left wrist tendinitis 09/07/2018   Unilateral primary osteoarthritis, left knee 04/03/2018   Celiac disease 08/21/2016   Abnormal thyroid function test 04/01/2016   Pain in thumb joint with movement of right hand 09/14/2015   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   Moderate major depression, single episode (HCC) 02/02/2010   Diabetic renal disease (HCC) 06/07/2009   Anemia 01/18/2009   Essential hypertension 01/18/2009   Past Medical History:  Diagnosis Date   Arthritis     Diabetes mellitus    ESRD on HD    T Th Sa   GERD (gastroesophageal reflux disease)    Hyperlipemia    Hypertension    Seasonal allergies    Sleep apnea    History reviewed. No pertinent family history. Past Surgical History:  Procedure Laterality Date   AV FISTULA PLACEMENT Left 04/10/2020   Procedure: LEFT ARM ARTERIOVENOUS (AV) FISTULA CREATION;  Surgeon: Sherren Kerns, MD;  Location: [facility] OR;  Service: Vascular;  Laterality: Left;   CAPSULOTOMY  01/13/2012   Procedure: MINOR CAPSULOTOMY;  Surgeon: Vita Erm., MD;  Location: [facility] OR;  Service: Ophthalmology;  Laterality: Left;   DORSAL COMPARTMENT RELEASE  01/22/2007   rt    FISTULA SUPERFICIALIZATION Left 05/29/2020   Procedure: LEFT UPPER ARM FISTULA SUPERFICIALIZATION WITH LIGATION OF MULTIPLE SIDE BRANCHES;  Surgeon: Sherren Kerns, MD;  Location: [facility] OR;  Service: Vascular;  Laterality: Left;   KNEE ARTHROSCOPY  02/21/2002   right and left   STERIOD INJECTION  08/01/2011   Procedure: STEROID INJECTION;  Surgeon: Wyn Forster., MD;  Location: [facility];  Service: Orthopedics;  Laterality: Left;  cmc left thumb   TONSILLECTOMY     TOTAL KNEE ARTHROPLASTY Left 02/08/2022   Procedure: LEFT TOTAL KNEE ARTHROPLASTY;  Surgeon: Nadara Mustard,  MD;  Location: [facility] OR;  Service: Orthopedics;  Laterality: Left;   TRIGGER FINGER RELEASE  01/22/2004   rt thumb   TRIGGER FINGER RELEASE  08/01/2011   Procedure: RELEASE TRIGGER FINGER/A-1 PULLEY;  Surgeon: Wyn Forster., MD;  Location: [facility];  Service: Orthopedics;  Laterality: Left;  left thumb   YAG LASER APPLICATION  01/13/2012   Procedure: YAG LASER APPLICATION;  Surgeon: Vita Erm., MD;  Location: [facility] OR;  Service: Ophthalmology;  Laterality: Left;   Social History   Occupational History   Not on file  Tobacco Use   Smoking status: Never   Smokeless tobacco: Never  Vaping Use   Vaping status: Never Used   Substance and Sexual Activity   Alcohol use: No   Drug use: No   Sexual activity: Not on file

## 2022-11-08 NOTE — Telephone Encounter (Signed)
Error

## 2022-11-08 NOTE — Progress Notes (Signed)
Functional Pain Scale - descriptive words and definitions  Moderate (4)   Constantly aware of pain, can complete ADLs with modification/sleep marginally affected at times/passive distraction is of no use, but active distraction gives some relief. Moderate range order  Average Pain  varies on activity  Lower back pain. MRI review

## 2022-11-13 ENCOUNTER — Ambulatory Visit: Payer: Medicare Other | Admitting: Family

## 2022-11-13 ENCOUNTER — Other Ambulatory Visit (INDEPENDENT_AMBULATORY_CARE_PROVIDER_SITE_OTHER): Payer: Self-pay

## 2022-11-13 ENCOUNTER — Encounter: Payer: Self-pay | Admitting: Family

## 2022-11-13 DIAGNOSIS — M25562 Pain in left knee: Secondary | ICD-10-CM

## 2022-11-13 DIAGNOSIS — B351 Tinea unguium: Secondary | ICD-10-CM | POA: Diagnosis not present

## 2022-11-13 NOTE — Progress Notes (Signed)
Office Visit Note   Patient: Chelsey Lee           Date of Birth: 1955-04-26           MRN: [id number] Visit Date: 11/13/2022              Requested by: Andi Devon, MD [address] [place],  [place] [address] PCP: Andi Devon, MD  Chief Complaint  Patient presents with   Left Knee - Pain    Hx TKR 02/08/2022      HPI: The patient is a 67 year old woman who presents today complaining of left knee pain.  She is status post total knee arthroplasty January of this year.  She does ambulate with a cane she states that she was turning over in bed 1 week ago and woke up with terrible pain in the knee she could not flex her knee this is gradually improved however she is concerned that something is wrong with her hardware.  Primarily having lateral knee pain reports that she initially had pain with flexion which has been proved.  She is no longer having any significant knee pain.  She is having difficulty trimming her own nails due to insensate neuropathy.  Request nail trim.  Assessment & Plan: Visit Diagnoses:  1. Acute pain of left knee     Plan: Reassurance of the left knee.  She will follow-up as needed..  Nails trimmed x 10.  Patient tolerated well.  Follow-Up Instructions: No follow-ups on file.   Left Knee Exam   Muscle Strength  The patient has normal left knee strength.  Tenderness  The patient is experiencing no tenderness.   Range of Motion  The patient has normal left knee ROM.  Tests  Varus: negative Valgus: negative  Other  Effusion: no effusion present      Patient is alert, oriented, no adenopathy, well-dressed, normal affect, normal respiratory effort. On examination bilateral feet there is no open area she does have thickened discolored onychomycotic nails x 10 these were trimmed x 10  Imaging: No results found. No images are attached to the encounter.  Labs: Lab Results  Component Value Date   HGBA1C 7.3 (H)  02/08/2022   LABURIC 8.9 (H) 03/06/2020     Lab Results  Component Value Date   ALBUMIN 2.8 (L) 02/12/2022   ALBUMIN 3.3 (L) 02/09/2022   ALBUMIN 3.1 (L) 07/20/2015    No results found for: "MG" No results found for: "VD25OH"  No results found for: "PREALBUMIN"    Latest Ref Rng & Units 02/12/2022    2:48 AM 02/09/2022    3:19 PM 02/08/2022    6:20 AM  CBC EXTENDED  WBC 4.0 - 10.5 K/uL 8.3  8.4    RBC 3.87 - 5.11 MIL/uL 2.96  3.19    Hemoglobin 12.0 - 15.0 g/dL 9.6  84.6  96.2   HCT 95.2 - 46.0 % 28.0  31.3  31.0   Platelets 150 - 400 K/uL 196  197       There is no height or weight on file to calculate BMI.  Orders:  Orders Placed This Encounter  Procedures   XR Knee 1-2 Views Left   No orders of the defined types were placed in this encounter.    Procedures: No procedures performed  Clinical Data: No additional findings.  ROS:  All other systems negative, except as noted in the HPI. Review of Systems  Objective: Vital Signs: There were no vitals  taken for this visit.  Specialty Comments:  CLINICAL DATA:  Low back pain radiating into the left hip since 2022. No acute injury or prior relevant surgery. Previous injection.   EXAM: MRI LUMBAR SPINE WITHOUT CONTRAST   TECHNIQUE: Multiplanar, multisequence MR imaging of the lumbar spine was performed. No intravenous contrast was administered.   COMPARISON:  Lumbar MRI 02/04/2019   FINDINGS: Segmentation: Conventional anatomy assumed, with the last open disc space designated L5-S1.Concurrent with prior imaging.   Alignment:  Stable chronic grade 1 anterolisthesis at L4-5.   Vertebrae: No worrisome osseous lesion, acute fracture or pars defect. There are multilevel endplate degenerative changes in the thoracolumbar spine with evidence of interval interbody ankylosis at L4-5. Interval chronic appearing Schmorl's node in the superior endplate of L4. Ankylosing lower thoracic paraspinal  osteophytes. Underlying congenitally short lumbar pedicles.   Conus medullaris: Extends to the L1 level and appears normal.   Paraspinal and other soft tissues: No significant paraspinal findings.   Disc levels:   Sagittal images demonstrate progressive multilevel spondylosis with ankylosing paraspinal osteophytes in the lower thoracic spine. There is mild right-sided foraminal narrowing at T10-11 and T11-12. No cord deformity.   L1-2: Mild disc bulging with anterior osteophytes. No significant spinal stenosis or foraminal narrowing.   L2-3: Preserved disc height with mild disc bulging and facet hypertrophy. No significant spinal stenosis or foraminal narrowing.   L3-4: Progressive loss of disc height with progressive annular disc bulging, facet and ligamentous hypertrophy. Resulting increased central canal stenosis, now moderate. There is mild to moderate left greater than right lateral recess and foraminal narrowing.   L4-5: Chronic spondylosis with interval interbody ankylosis. Advanced facet disease with probable interfacetal ankylosis. Resulting chronic moderate multifactorial spinal stenosis and moderate lateral recess and foraminal narrowing bilaterally are similar to the previous study.   L5-S1: Chronic loss of disc height with annular disc bulging and endplate osteophytes. Moderate bilateral facet hypertrophy. Mild spinal stenosis and mild lateral recess narrowing bilaterally. There is chronic moderate to severe foraminal narrowing bilaterally.   IMPRESSION: 1. 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 Evidence for multilevel ankylosing osteophytes in the lower thoracic spine. 2. Progressive spondylosis at L3-4 with resulting moderate multifactorial spinal stenosis and mild to moderate left greater than right lateral recess and foraminal narrowing. 3. Chronic  spondylosis at L4-5 with interval interbody and probable interfacetal ankylosis. Resulting chronic moderate multifactorial spinal stenosis and moderate lateral recess and foraminal narrowing bilaterally. 4. Chronic mild multifactorial spinal stenosis and moderate to severe foraminal narrowing bilaterally at L5-S1. 5. No acute findings or clear explanation for the patient's symptoms.     Electronically Signed   By: Carey Bullocks M.D.   On: 11/03/2022 14:51  PMFS History: Patient Active Problem List   Diagnosis Date Noted   OA (osteoarthritis) of knee 02/08/2022   Total knee replacement status, left 02/08/2022   Pain in left hand 06/29/2021   Cerebrovascular disease 05/11/2020   Gout 03/09/2020   Allergic rhinitis 01/25/2020   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   Colon cancer screening 01/25/2020   Gastroesophageal reflux disease without esophagitis 01/25/2020   Hyperlipidemia 01/25/2020   Morbid obesity (HCC) 01/25/2020   History of colonic polyps 01/25/2020   OSA (obstructive sleep apnea) 01/25/2020   Vitamin D deficiency 01/25/2020   Controlled type 2 diabetes mellitus without complication (HCC) 11/10/2019   Chronic kidney disease, stage 4 (severe) (HCC) 06/29/2019   Long term (current)  use of insulin (HCC) 06/29/2019   Epidural lipomatosis 03/02/2019   Spondylolisthesis of lumbar region 03/02/2019   Spinal stenosis of lumbar region with neurogenic claudication 03/02/2019   Left wrist tendinitis 09/07/2018   Unilateral primary osteoarthritis, left knee 04/03/2018   Celiac disease 08/21/2016   Abnormal thyroid function test 04/01/2016   Pain in thumb joint with movement of right hand 09/14/2015   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   Moderate major depression, single episode (HCC) 02/02/2010   Diabetic renal disease (HCC) 06/07/2009   Anemia 01/18/2009   Essential hypertension 01/18/2009   Past Medical History:  Diagnosis Date   Arthritis    Diabetes mellitus     ESRD on HD    T Th Sa   GERD (gastroesophageal reflux disease)    Hyperlipemia    Hypertension    Seasonal allergies    Sleep apnea     History reviewed. No pertinent family history.  Past Surgical History:  Procedure Laterality Date   AV FISTULA PLACEMENT Left 04/10/2020   Procedure: LEFT ARM ARTERIOVENOUS (AV) FISTULA CREATION;  Surgeon: Sherren Kerns, MD;  Location: [facility] OR;  Service: Vascular;  Laterality: Left;   CAPSULOTOMY  01/13/2012   Procedure: MINOR CAPSULOTOMY;  Surgeon: Vita Erm., MD;  Location: [facility] OR;  Service: Ophthalmology;  Laterality: Left;   DORSAL COMPARTMENT RELEASE  01/22/2007   rt    FISTULA SUPERFICIALIZATION Left 05/29/2020   Procedure: LEFT UPPER ARM FISTULA SUPERFICIALIZATION WITH LIGATION OF MULTIPLE SIDE BRANCHES;  Surgeon: Sherren Kerns, MD;  Location: [facility] OR;  Service: Vascular;  Laterality: Left;   KNEE ARTHROSCOPY  02/21/2002   right and left   STERIOD INJECTION  08/01/2011   Procedure: STEROID INJECTION;  Surgeon: Wyn Forster., MD;  Location: [facility];  Service: Orthopedics;  Laterality: Left;  cmc left thumb   TONSILLECTOMY     TOTAL KNEE ARTHROPLASTY Left 02/08/2022   Procedure: LEFT TOTAL KNEE ARTHROPLASTY;  Surgeon: Nadara Mustard, MD;  Location: [facility] OR;  Service: Orthopedics;  Laterality: Left;   TRIGGER FINGER RELEASE  01/22/2004   rt thumb   TRIGGER FINGER RELEASE  08/01/2011   Procedure: RELEASE TRIGGER FINGER/A-1 PULLEY;  Surgeon: Wyn Forster., MD;  Location: [facility];  Service: Orthopedics;  Laterality: Left;  left thumb   YAG LASER APPLICATION  01/13/2012   Procedure: YAG LASER APPLICATION;  Surgeon: Vita Erm., MD;  Location: [facility] OR;  Service: Ophthalmology;  Laterality: Left;   Social History   Occupational History   Not on file  Tobacco Use   Smoking status: Never   Smokeless tobacco: Never  Vaping Use   Vaping status: Never Used  Substance and  Sexual Activity   Alcohol use: No   Drug use: No   Sexual activity: Not on file

## 2022-11-18 ENCOUNTER — Ambulatory Visit: Payer: Medicare Other | Admitting: Physical Medicine and Rehabilitation

## 2022-11-18 ENCOUNTER — Other Ambulatory Visit: Payer: Self-pay

## 2022-11-18 VITALS — BP 131/54 | HR 74

## 2022-11-18 DIAGNOSIS — M5416 Radiculopathy, lumbar region: Secondary | ICD-10-CM | POA: Diagnosis not present

## 2022-11-18 MED ORDER — METHYLPREDNISOLONE ACETATE 40 MG/ML IJ SUSP
40.0000 mg | Freq: Once | INTRAMUSCULAR | Status: AC
  Administered 2022-11-18: 40 mg

## 2022-11-18 NOTE — Progress Notes (Signed)
Chelsey Lee - 67 y.o. female MRN [id number]  Date of birth: 1955/12/23  Office Visit Note: Visit Date: 11/18/2022 PCP: Andi Devon, MD Referred by: Andi Devon, MD  Subjective: Chief Complaint  Patient presents with   Lower Back - Pain   HPI:  Chelsey Lee is a 67 y.o. female who comes in today at the request of Analynn Bethard, FNP for planned Left L3-4 Lumbar Interlaminar epidural steroid injection with fluoroscopic guidance.  The patient has failed conservative care including home exercise, medications, time and activity modification.  This injection will be diagnostic and hopefully therapeutic.  Please see requesting physician notes for further details and justification.   ROS Otherwise per HPI.  Assessment & Plan: Visit Diagnoses:    ICD-10-CM   1. Lumbar radiculopathy  M54.16 XR C-ARM NO REPORT    Epidural Steroid injection    methylPREDNISolone acetate (DEPO-MEDROL) injection 40 mg      Plan: No additional findings.   Meds & Orders:  Meds ordered this encounter  Medications   methylPREDNISolone acetate (DEPO-MEDROL) injection 40 mg    Orders Placed This Encounter  Procedures   XR C-ARM NO REPORT   Epidural Steroid injection    Follow-up: Return for visit to requesting provider as needed.   Procedures: No procedures performed  Lumbar Epidural Steroid Injection - Interlaminar Approach with Fluoroscopic Guidance  Patient: Chelsey Lee      Date of Birth: Sep 04, 1955 MRN: [id number] PCP: Andi Devon, MD      Visit Date: 11/18/2022   Universal Protocol:     Consent Given By: the patient  Position: PRONE  Additional Comments: Vital signs were monitored before and after the procedure. Patient was prepped and draped in the usual sterile fashion. The correct patient, procedure, and site was verified.   Injection Procedure Details:   Procedure diagnoses: Lumbar radiculopathy [M54.16]   Meds Administered:  Meds ordered this  encounter  Medications   methylPREDNISolone acetate (DEPO-MEDROL) injection 40 mg     Laterality: Left  Location/Site:  L3-4  Needle: 3.5 in., 20 ga. Tuohy  Needle Placement: Paramedian epidural  Findings:   -Comments: Excellent flow of contrast into the epidural space.  Procedure Details: Using a paramedian approach from the side mentioned above, the region overlying the inferior lamina was localized under fluoroscopic visualization and the soft tissues overlying this structure were infiltrated with 4 ml. of 1% Lidocaine without Epinephrine. The Tuohy needle was inserted into the epidural space using a paramedian approach.   The epidural space was localized using loss of resistance along with counter oblique bi-planar fluoroscopic views.  After negative aspirate for air, blood, and CSF, a 2 ml. volume of Isovue-250 was injected into the epidural space and the flow of contrast was observed. Radiographs were obtained for documentation purposes.    The injectate was administered into the level noted above.   Additional Comments:  The patient tolerated the procedure well Dressing: 2 x 2 sterile gauze and Band-Aid    Post-procedure details: Patient was observed during the procedure. Post-procedure instructions were reviewed.  Patient left the clinic in stable condition.   Clinical History: CLINICAL DATA:  Low back pain radiating into the left hip since 2022. No acute injury or prior relevant surgery. Previous injection.   EXAM: MRI LUMBAR SPINE WITHOUT CONTRAST   TECHNIQUE: Multiplanar, multisequence MR imaging of the lumbar spine was performed. No intravenous contrast was administered.   COMPARISON:  Lumbar MRI 02/04/2019   FINDINGS: Segmentation: Conventional anatomy  assumed, with the last open disc space designated L5-S1.Concurrent with prior imaging.   Alignment:  Stable chronic grade 1 anterolisthesis at L4-5.   Vertebrae: No worrisome osseous lesion, acute  fracture or pars defect. There are multilevel endplate degenerative changes in the thoracolumbar spine with evidence of interval interbody ankylosis at L4-5. Interval chronic appearing Schmorl's node in the superior endplate of L4. Ankylosing lower thoracic paraspinal osteophytes. Underlying congenitally short lumbar pedicles.   Conus medullaris: Extends to the L1 level and appears normal.   Paraspinal and other soft tissues: No significant paraspinal findings.   Disc levels:   Sagittal images demonstrate progressive multilevel spondylosis with ankylosing paraspinal osteophytes in the lower thoracic spine. There is mild right-sided foraminal narrowing at T10-11 and T11-12. No cord deformity.   L1-2: Mild disc bulging with anterior osteophytes. No significant spinal stenosis or foraminal narrowing.   L2-3: Preserved disc height with mild disc bulging and facet hypertrophy. No significant spinal stenosis or foraminal narrowing.   L3-4: Progressive loss of disc height with progressive annular disc bulging, facet and ligamentous hypertrophy. Resulting increased central canal stenosis, now moderate. There is mild to moderate left greater than right lateral recess and foraminal narrowing.   L4-5: Chronic spondylosis with interval interbody ankylosis. Advanced facet disease with probable interfacetal ankylosis. Resulting chronic moderate multifactorial spinal stenosis and moderate lateral recess and foraminal narrowing bilaterally are similar to the previous study.   L5-S1: Chronic loss of disc height with annular disc bulging and endplate osteophytes. Moderate bilateral facet hypertrophy. Mild spinal stenosis and mild lateral recess narrowing bilaterally. There is chronic moderate to severe foraminal narrowing bilaterally.   IMPRESSION: 1. 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 Evidence for multilevel ankylosing osteophytes in the lower thoracic spine. 2. Progressive spondylosis at L3-4 with resulting moderate multifactorial spinal stenosis and mild to moderate left greater than right lateral recess and foraminal narrowing. 3. Chronic spondylosis at L4-5 with interval interbody and probable interfacetal ankylosis. Resulting chronic moderate multifactorial spinal stenosis and moderate lateral recess and foraminal narrowing bilaterally. 4. Chronic mild multifactorial spinal stenosis and moderate to severe foraminal narrowing bilaterally at L5-S1. 5. No acute findings or clear explanation for the patient's symptoms.     Electronically Signed   By: Carey Bullocks M.D.   On: 11/03/2022 14:51     Objective:  VS:  HT:    WT:   BMI:     BP:(!) 131/54  HR:74bpm  TEMP: ( )  RESP:  Physical Exam Vitals and nursing note reviewed.  Constitutional:      General: She is not in acute distress.    Appearance: Normal appearance. She is obese. She is not ill-appearing.  HENT:     Head: Normocephalic and atraumatic.     Right Ear: External ear normal.     Left Ear: External ear normal.  Eyes:     Extraocular Movements: Extraocular movements intact.  Cardiovascular:     Rate and Rhythm: Normal rate.     Pulses: Normal pulses.  Pulmonary:     Effort: Pulmonary effort is normal. No respiratory distress.  Abdominal:     General: There is no distension.     Palpations: Abdomen is soft.  Musculoskeletal:        General: Tenderness present.     Cervical back: Neck supple.     Right lower leg: No edema.     Left lower leg: No edema.  Comments: Patient has good distal strength with no pain over the greater trochanters.  No clonus or focal weakness.  Skin:    Findings: No erythema, lesion or rash.  Neurological:     General: No focal deficit present.     Mental Status: She is alert and oriented to person, place, and time.     Sensory: No sensory  deficit.     Motor: No weakness or abnormal muscle tone.     Coordination: Coordination normal.  Psychiatric:        Mood and Affect: Mood normal.        Behavior: Behavior normal.      Imaging: No results found.

## 2022-11-18 NOTE — Patient Instructions (Signed)

## 2022-11-18 NOTE — Procedures (Signed)
Lumbar Epidural Steroid Injection - Interlaminar Approach with Fluoroscopic Guidance  Patient: Chelsey Lee      Date of Birth: 27-Nov-1955 MRN: [id number] PCP: Andi Devon, MD      Visit Date: 11/18/2022   Universal Protocol:     Consent Given By: the patient  Position: PRONE  Additional Comments: Vital signs were monitored before and after the procedure. Patient was prepped and draped in the usual sterile fashion. The correct patient, procedure, and site was verified.   Injection Procedure Details:   Procedure diagnoses: Lumbar radiculopathy [M54.16]   Meds Administered:  Meds ordered this encounter  Medications   methylPREDNISolone acetate (DEPO-MEDROL) injection 40 mg     Laterality: Left  Location/Site:  L3-4  Needle: 3.5 in., 20 ga. Tuohy  Needle Placement: Paramedian epidural  Findings:   -Comments: Excellent flow of contrast into the epidural space.  Procedure Details: Using a paramedian approach from the side mentioned above, the region overlying the inferior lamina was localized under fluoroscopic visualization and the soft tissues overlying this structure were infiltrated with 4 ml. of 1% Lidocaine without Epinephrine. The Tuohy needle was inserted into the epidural space using a paramedian approach.   The epidural space was localized using loss of resistance along with counter oblique bi-planar fluoroscopic views.  After negative aspirate for air, blood, and CSF, a 2 ml. volume of Isovue-250 was injected into the epidural space and the flow of contrast was observed. Radiographs were obtained for documentation purposes.    The injectate was administered into the level noted above.   Additional Comments:  The patient tolerated the procedure well Dressing: 2 x 2 sterile gauze and Band-Aid    Post-procedure details: Patient was observed during the procedure. Post-procedure instructions were reviewed.  Patient left the clinic in stable  condition.

## 2022-11-18 NOTE — Progress Notes (Signed)
Functional Pain Scale - descriptive words and definitions  Moderate (4)   Constantly aware of pain, can complete ADLs with modification/sleep marginally affected at times/passive distraction is of no use, but active distraction gives some relief. Moderate range order  Average Pain 3   +Driver, -BT, -Dye Allergies.  Lower back pain on left side that radiates in the left leg to the knee

## 2022-12-02 ENCOUNTER — Telehealth: Payer: Self-pay | Admitting: Physical Medicine and Rehabilitation

## 2022-12-02 NOTE — Telephone Encounter (Signed)
Pt called requesting pain medication. Pt state injection did not do any good. Please send medication tom pharmacy on file. Pt phone number is [phone].

## 2022-12-05 ENCOUNTER — Telehealth: Payer: Self-pay | Admitting: Physical Medicine and Rehabilitation

## 2022-12-05 NOTE — Telephone Encounter (Signed)
Patient called. She would like another injection. Her cb#[phone]

## 2022-12-11 ENCOUNTER — Other Ambulatory Visit: Payer: Self-pay | Admitting: Internal Medicine

## 2022-12-11 DIAGNOSIS — Z1231 Encounter for screening mammogram for malignant neoplasm of breast: Secondary | ICD-10-CM

## 2022-12-11 DIAGNOSIS — E2839 Other primary ovarian failure: Secondary | ICD-10-CM

## 2022-12-16 ENCOUNTER — Telehealth: Payer: Self-pay

## 2022-12-16 NOTE — Telephone Encounter (Signed)
Patient called into the office stating that her injection didn't even last one day she had the injection 10/28  She stated she needed something stronger. Please advise as she is having the same pain as prior to injection.

## 2022-12-17 ENCOUNTER — Other Ambulatory Visit: Payer: Self-pay | Admitting: Physical Medicine and Rehabilitation

## 2022-12-17 DIAGNOSIS — M5416 Radiculopathy, lumbar region: Secondary | ICD-10-CM

## 2022-12-17 DIAGNOSIS — M48062 Spinal stenosis, lumbar region with neurogenic claudication: Secondary | ICD-10-CM

## 2022-12-23 ENCOUNTER — Telehealth: Payer: Self-pay | Admitting: Physical Medicine and Rehabilitation

## 2022-12-23 NOTE — Telephone Encounter (Signed)
Patient called and said she needs medication for the pain in her back. CB#[phone]

## 2022-12-24 ENCOUNTER — Other Ambulatory Visit: Payer: Self-pay | Admitting: Physical Medicine and Rehabilitation

## 2022-12-24 MED ORDER — HYDROCODONE-ACETAMINOPHEN 5-325 MG PO TABS: 1.0000 | ORAL_TABLET | Freq: Three times a day (TID) | ORAL | 0 refills | Status: AC | PRN

## 2022-12-25 ENCOUNTER — Telehealth: Payer: Self-pay | Admitting: Physical Medicine and Rehabilitation

## 2022-12-25 NOTE — Telephone Encounter (Signed)
Patient called asked if she can get something for pain? The number to contact patient is [phone]

## 2023-01-08 ENCOUNTER — Other Ambulatory Visit: Payer: Self-pay

## 2023-01-08 ENCOUNTER — Ambulatory Visit: Payer: Medicare Other | Admitting: Physical Medicine and Rehabilitation

## 2023-01-08 VITALS — BP 130/60 | HR 73

## 2023-01-08 DIAGNOSIS — M5416 Radiculopathy, lumbar region: Secondary | ICD-10-CM

## 2023-01-08 MED ORDER — METHYLPREDNISOLONE ACETATE 40 MG/ML IJ SUSP
40.0000 mg | Freq: Once | INTRAMUSCULAR | Status: AC
  Administered 2023-01-08: 40 mg

## 2023-01-08 NOTE — Patient Instructions (Signed)

## 2023-01-08 NOTE — Progress Notes (Signed)
Pt does NOT have a driver today No blood thinners No contrast allergy Rates pain 10 out of 10 BP 130/60 right arm large cuff P 73 L5-S1 IL

## 2023-01-16 NOTE — Progress Notes (Signed)
Chelsey Lee - 67 y.o. female MRN [id number]  Date of birth: 16-Mar-1955  Office Visit Note: Visit Date: 01/08/2023 PCP: Andi Devon, MD Referred by: Andi Devon, MD  Subjective: Chief Complaint  Patient presents with   Lower Back - Pain   HPI:  Chelsey Lee is a 67 y.o. female who comes in today at the request of Anokhi Alonge, FNP for planned Left L5-S1 Lumbar Interlaminar epidural steroid injection with fluoroscopic guidance.  The patient has failed conservative care including home exercise, medications, time and activity modification.  This injection will be diagnostic and hopefully therapeutic.  Please see requesting physician notes for further details and justification.   ROS Otherwise per HPI.  Assessment & Plan: Visit Diagnoses:    ICD-10-CM   1. Lumbar radiculopathy  M54.16 XR C-ARM NO REPORT    Epidural Steroid injection    methylPREDNISolone acetate (DEPO-MEDROL) injection 40 mg      Plan: No additional findings.   Meds & Orders:  Meds ordered this encounter  Medications   methylPREDNISolone acetate (DEPO-MEDROL) injection 40 mg    Orders Placed This Encounter  Procedures   XR C-ARM NO REPORT   Epidural Steroid injection    Follow-up: Return if symptoms worsen or fail to improve.   Procedures: No procedures performed  Lumbar Epidural Steroid Injection - Interlaminar Approach with Fluoroscopic Guidance  Patient: Chelsey Lee      Date of Birth: 1955/09/20 MRN: [id number] PCP: Andi Devon, MD      Visit Date: 01/08/2023   Universal Protocol:     Consent Given By: the patient  Position: PRONE  Additional Comments: Vital signs were monitored before and after the procedure. Patient was prepped and draped in the usual sterile fashion. The correct patient, procedure, and site was verified.   Injection Procedure Details:   Procedure diagnoses: Lumbar radiculopathy [M54.16]   Meds Administered:  Meds ordered this  encounter  Medications   methylPREDNISolone acetate (DEPO-MEDROL) injection 40 mg     Laterality: Left  Location/Site:  L5-S1  Needle: 4.5 in., 20 ga. Tuohy  Needle Placement: Paramedian epidural  Findings:   -Comments: Excellent flow of contrast into the epidural space.  Procedure Details: Using a paramedian approach from the side mentioned above, the region overlying the inferior lamina was localized under fluoroscopic visualization and the soft tissues overlying this structure were infiltrated with 4 ml. of 1% Lidocaine without Epinephrine. The Tuohy needle was inserted into the epidural space using a paramedian approach.   The epidural space was localized using loss of resistance along with counter oblique bi-planar fluoroscopic views.  After negative aspirate for air, blood, and CSF, a 2 ml. volume of Isovue-250 was injected into the epidural space and the flow of contrast was observed. Radiographs were obtained for documentation purposes.    The injectate was administered into the level noted above.   Additional Comments:  No complications occurred Dressing: 2 x 2 sterile gauze and Band-Aid    Post-procedure details: Patient was observed during the procedure. Post-procedure instructions were reviewed.  Patient left the clinic in stable condition.   Clinical History: CLINICAL DATA:  Low back pain radiating into the left hip since 2022. No acute injury or prior relevant surgery. Previous injection.   EXAM: MRI LUMBAR SPINE WITHOUT CONTRAST   TECHNIQUE: Multiplanar, multisequence MR imaging of the lumbar spine was performed. No intravenous contrast was administered.   COMPARISON:  Lumbar MRI 02/04/2019   FINDINGS: Segmentation: Conventional anatomy assumed, with the  last open disc space designated L5-S1.Concurrent with prior imaging.   Alignment:  Stable chronic grade 1 anterolisthesis at L4-5.   Vertebrae: No worrisome osseous lesion, acute fracture or  pars defect. There are multilevel endplate degenerative changes in the thoracolumbar spine with evidence of interval interbody ankylosis at L4-5. Interval chronic appearing Schmorl's node in the superior endplate of L4. Ankylosing lower thoracic paraspinal osteophytes. Underlying congenitally short lumbar pedicles.   Conus medullaris: Extends to the L1 level and appears normal.   Paraspinal and other soft tissues: No significant paraspinal findings.   Disc levels:   Sagittal images demonstrate progressive multilevel spondylosis with ankylosing paraspinal osteophytes in the lower thoracic spine. There is mild right-sided foraminal narrowing at T10-11 and T11-12. No cord deformity.   L1-2: Mild disc bulging with anterior osteophytes. No significant spinal stenosis or foraminal narrowing.   L2-3: Preserved disc height with mild disc bulging and facet hypertrophy. No significant spinal stenosis or foraminal narrowing.   L3-4: Progressive loss of disc height with progressive annular disc bulging, facet and ligamentous hypertrophy. Resulting increased central canal stenosis, now moderate. There is mild to moderate left greater than right lateral recess and foraminal narrowing.   L4-5: Chronic spondylosis with interval interbody ankylosis. Advanced facet disease with probable interfacetal ankylosis. Resulting chronic moderate multifactorial spinal stenosis and moderate lateral recess and foraminal narrowing bilaterally are similar to the previous study.   L5-S1: Chronic loss of disc height with annular disc bulging and endplate osteophytes. Moderate bilateral facet hypertrophy. Mild spinal stenosis and mild lateral recess narrowing bilaterally. There is chronic moderate to severe foraminal narrowing bilaterally.   IMPRESSION: 1. 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  Evidence for multilevel ankylosing osteophytes in the lower thoracic spine. 2. Progressive spondylosis at L3-4 with resulting moderate multifactorial spinal stenosis and mild to moderate left greater than right lateral recess and foraminal narrowing. 3. Chronic spondylosis at L4-5 with interval interbody and probable interfacetal ankylosis. Resulting chronic moderate multifactorial spinal stenosis and moderate lateral recess and foraminal narrowing bilaterally. 4. Chronic mild multifactorial spinal stenosis and moderate to severe foraminal narrowing bilaterally at L5-S1. 5. No acute findings or clear explanation for the patient's symptoms.     Electronically Signed   By: Carey Bullocks M.D.   On: 11/03/2022 14:51     Objective:  VS:  HT:    WT:   BMI:     BP:130/60  HR:73bpm  TEMP: ( )  RESP:  Physical Exam Vitals and nursing note reviewed.  Constitutional:      General: She is not in acute distress.    Appearance: Normal appearance. She is obese. She is not ill-appearing.  HENT:     Head: Normocephalic and atraumatic.     Right Ear: External ear normal.     Left Ear: External ear normal.  Eyes:     Extraocular Movements: Extraocular movements intact.  Cardiovascular:     Rate and Rhythm: Normal rate.     Pulses: Normal pulses.  Pulmonary:     Effort: Pulmonary effort is normal. No respiratory distress.  Abdominal:     General: There is no distension.     Palpations: Abdomen is soft.  Musculoskeletal:        General: Tenderness present.     Cervical back: Neck supple.     Right lower leg: No edema.     Left lower leg: No edema.     Comments:  Patient has good distal strength with no pain over the greater trochanters.  No clonus or focal weakness.  Skin:    Findings: No erythema, lesion or rash.  Neurological:     General: No focal deficit present.     Mental Status: She is alert and oriented to person, place, and time.     Sensory: No sensory deficit.      Motor: No weakness or abnormal muscle tone.     Coordination: Coordination normal.  Psychiatric:        Mood and Affect: Mood normal.        Behavior: Behavior normal.      Imaging: No results found.

## 2023-01-16 NOTE — Procedures (Signed)
Lumbar Epidural Steroid Injection - Interlaminar Approach with Fluoroscopic Guidance  Patient: Chelsey Lee      Date of Birth: 1955/04/01 MRN: [id number] PCP: Andi Devon, MD      Visit Date: 01/08/2023   Universal Protocol:     Consent Given By: the patient  Position: PRONE  Additional Comments: Vital signs were monitored before and after the procedure. Patient was prepped and draped in the usual sterile fashion. The correct patient, procedure, and site was verified.   Injection Procedure Details:   Procedure diagnoses: Lumbar radiculopathy [M54.16]   Meds Administered:  Meds ordered this encounter  Medications   methylPREDNISolone acetate (DEPO-MEDROL) injection 40 mg     Laterality: Left  Location/Site:  L5-S1  Needle: 4.5 in., 20 ga. Tuohy  Needle Placement: Paramedian epidural  Findings:   -Comments: Excellent flow of contrast into the epidural space.  Procedure Details: Using a paramedian approach from the side mentioned above, the region overlying the inferior lamina was localized under fluoroscopic visualization and the soft tissues overlying this structure were infiltrated with 4 ml. of 1% Lidocaine without Epinephrine. The Tuohy needle was inserted into the epidural space using a paramedian approach.   The epidural space was localized using loss of resistance along with counter oblique bi-planar fluoroscopic views.  After negative aspirate for air, blood, and CSF, a 2 ml. volume of Isovue-250 was injected into the epidural space and the flow of contrast was observed. Radiographs were obtained for documentation purposes.    The injectate was administered into the level noted above.   Additional Comments:  No complications occurred Dressing: 2 x 2 sterile gauze and Band-Aid    Post-procedure details: Patient was observed during the procedure. Post-procedure instructions were reviewed.  Patient left the clinic in stable condition.

## 2023-01-17 ENCOUNTER — Ambulatory Visit
Admission: RE | Admit: 2023-01-17 | Discharge: 2023-01-17 | Disposition: A | Discharge status: Home / Self Care | Payer: Medicare Other | Source: Ambulatory Visit | Attending: Internal Medicine | Admitting: Internal Medicine

## 2023-01-17 DIAGNOSIS — Z1231 Encounter for screening mammogram for malignant neoplasm of breast: Secondary | ICD-10-CM

## 2023-01-29 ENCOUNTER — Telehealth: Payer: Self-pay | Admitting: Physical Medicine and Rehabilitation

## 2023-01-29 NOTE — Telephone Encounter (Signed)
 Patient called advised the injection did not work at all. Patient asked if she can get a call back as soon as possible.   The number to contact patient is [phone]

## 2023-02-03 ENCOUNTER — Telehealth: Payer: Self-pay | Admitting: Physical Medicine and Rehabilitation

## 2023-02-03 NOTE — Telephone Encounter (Signed)
 Pt called requesting pain medication for her back pans. Pt states injection did not work. Please send medication [facility] on [address]. Pt phone number is [phone].

## 2023-02-09 ENCOUNTER — Encounter (HOSPITAL_COMMUNITY): Payer: Self-pay

## 2023-02-09 ENCOUNTER — Emergency Department (HOSPITAL_COMMUNITY)
Admission: EM | Admit: 2023-02-09 | Discharge: 2023-02-09 | Disposition: A | Discharge status: Home / Self Care | Payer: Medicare Other | Attending: Emergency Medicine | Admitting: Emergency Medicine

## 2023-02-09 ENCOUNTER — Emergency Department (HOSPITAL_COMMUNITY): Payer: Medicare Other

## 2023-02-09 ENCOUNTER — Other Ambulatory Visit: Payer: Self-pay

## 2023-02-09 DIAGNOSIS — W19XXXA Unspecified fall, initial encounter: Secondary | ICD-10-CM

## 2023-02-09 DIAGNOSIS — W07XXXA Fall from chair, initial encounter: Secondary | ICD-10-CM | POA: Insufficient documentation

## 2023-02-09 DIAGNOSIS — S80211A Abrasion, right knee, initial encounter: Secondary | ICD-10-CM | POA: Diagnosis not present

## 2023-02-09 DIAGNOSIS — R739 Hyperglycemia, unspecified: Secondary | ICD-10-CM | POA: Insufficient documentation

## 2023-02-09 DIAGNOSIS — N186 End stage renal disease: Secondary | ICD-10-CM | POA: Diagnosis not present

## 2023-02-09 DIAGNOSIS — R531 Weakness: Secondary | ICD-10-CM | POA: Diagnosis present

## 2023-02-09 DIAGNOSIS — Z992 Dependence on renal dialysis: Secondary | ICD-10-CM | POA: Insufficient documentation

## 2023-02-09 DIAGNOSIS — Z794 Long term (current) use of insulin: Secondary | ICD-10-CM | POA: Insufficient documentation

## 2023-02-09 DIAGNOSIS — Z96652 Presence of left artificial knee joint: Secondary | ICD-10-CM | POA: Insufficient documentation

## 2023-02-09 LAB — CBC WITH DIFFERENTIAL/PLATELET
Abs Immature Granulocytes: 0.03 10*3/uL (ref 0.00–0.07)
Basophils Absolute: 0 10*3/uL (ref 0.0–0.1)
Basophils Relative: 0 %
Eosinophils Absolute: 0.1 10*3/uL (ref 0.0–0.5)
Eosinophils Relative: 1 %
HCT: 38.4 % (ref 36.0–46.0)
Hemoglobin: 12.4 g/dL (ref 12.0–15.0)
Immature Granulocytes: 0 %
Lymphocytes Relative: 25 %
Lymphs Abs: 1.8 10*3/uL (ref 0.7–4.0)
MCH: 32.6 pg (ref 26.0–34.0)
MCHC: 32.3 g/dL (ref 30.0–36.0)
MCV: 101.1 fL — ABNORMAL HIGH (ref 80.0–100.0)
Monocytes Absolute: 0.8 10*3/uL (ref 0.1–1.0)
Monocytes Relative: 11 %
Neutro Abs: 4.5 10*3/uL (ref 1.7–7.7)
Neutrophils Relative %: 63 %
Platelets: 155 10*3/uL (ref 150–400)
RBC: 3.8 MIL/uL — ABNORMAL LOW (ref 3.87–5.11)
RDW: 13.8 % (ref 11.5–15.5)
WBC: 7.1 10*3/uL (ref 4.0–10.5)
nRBC: 0 % (ref 0.0–0.2)

## 2023-02-09 LAB — CBG MONITORING, ED: Glucose-Capillary: 323 mg/dL — ABNORMAL HIGH (ref 70–99)

## 2023-02-09 LAB — BASIC METABOLIC PANEL
Anion gap: 12 (ref 5–15)
BUN: 30 mg/dL — ABNORMAL HIGH (ref 8–23)
CO2: 29 mmol/L (ref 22–32)
Calcium: 9.5 mg/dL (ref 8.9–10.3)
Chloride: 92 mmol/L — ABNORMAL LOW (ref 98–111)
Creatinine, Ser: 7.59 mg/dL — ABNORMAL HIGH (ref 0.44–1.00)
GFR, Estimated: 5 mL/min — ABNORMAL LOW (ref >60)
Glucose, Bld: 338 mg/dL — ABNORMAL HIGH (ref 70–99)
Potassium: 5.3 mmol/L — ABNORMAL HIGH (ref 3.5–5.1)
Sodium: 133 mmol/L — ABNORMAL LOW (ref 135–145)

## 2023-02-09 NOTE — ED Notes (Signed)
Melburn Popper (Sister) of [facility] called asking for a update. Her number is [phone].

## 2023-02-09 NOTE — ED Triage Notes (Addendum)
Pt to ED via [facility] from home. Pt was sitting down, reached for phone and fell out of chair onto one knee and was unable to get back up d/t generalized weakness. Pt is a dialysis pt, Tues, Thurs, Sat, went yesterday for full treatment. 18g RFA. Pt c/o dizziness and feeling weak at this time. Pt alert to self, situation and place, disoriented to time. Pt denies LOC.   Initial pressure 90/40 When standing SBP 86/palp 107/63 Cbg=438

## 2023-02-09 NOTE — ED Provider Notes (Signed)
[place] [facility] AT [facility] Provider Note   CSN: [id number] Arrival date & time: 02/09/23  1144     History  Chief Complaint  Patient presents with   Weakness    Chelsey Lee is a 68 y.o. female.  Patient brought in by MS.  Patient was in her chair reached for her phone and then fell out of the chair.  She was sitting down at the time.  Did not hit her head no loss of consciousness.  Abrasion to her right knee.  Patient recently had knee replacement on the left knee.  But that was few months ago.  Patient is a dialysis patient normally dialyzed Tuesday Thursday Saturdays did have full treatment yesterday.  Patient is having eye surgery on Thursday.  So she has been holding her Ozempic.  Patient complaining of feeling dizzy and weak at the time.  She was unable to get herself up to get back in the chair so EMS was called.  EMS initial blood pressure was 90/40 blood pressure then improved to 107/63.  Blood sugar was 438 there.  Initial blood sugar when she got here at around 230 was 88/41 but no blood pressure since that time have been 127/41.  Patient is on Coreg.  Patient denies any upper respiratory symptoms.  Patient feels fine now.  Does have an abrasion to her right knee.  But able to use it she is not worried about any injury to that knee.  Patient denies any neck or back pain any chest pain or abdominal pain and hip pain or any significant lower extremity or upper extremity pain.       Home Medications Prior to Admission medications   Medication Sig Start Date End Date Taking? Authorizing Provider  allopurinol (ZYLOPRIM) 100 MG tablet Take 1 tablet (100 mg total) by mouth daily. 10/10/20   Adonis Huguenin, NP  ammonium lactate (AMLACTIN) 12 % lotion Apply 1 Application topically as needed for dry skin. 12/05/21   Candelaria Stagers, DPM  atorvastatin (LIPITOR) 20 MG tablet Take 20 mg by mouth daily.    [provider]  AURYXIA 1 GM 210 MG(Fe)  tablet Take 210-420 mg by mouth See admin instructions. 420 mg three times daily with meals, and 210 mg twice daily with snacks    [provider]  B Complex-C-Folic Acid (DIALYVITE TABLET) TABS Take 1 tablet by mouth daily.    [provider]  buPROPion (WELLBUTRIN SR) 150 MG 12 hr tablet Take 150 mg by mouth 2 (two) times daily. 02/18/20   [provider]  carvedilol (COREG) 25 MG tablet Take 25 mg by mouth 2 (two) times daily with a meal.    [provider]  CHELATED MAGNESIUM PO Take 2 tablets by mouth every evening.    [provider]  Continuous Blood Gluc Sensor (FREESTYLE LIBRE 2 SENSOR) MISC  05/12/20   [provider]  Dulaglutide (TRULICITY) 1.5 MG/0.5ML SOPN Inject 1.5 mg into the skin once a week.    [provider]  escitalopram (LEXAPRO) 10 MG tablet Take 10 mg by mouth daily.    [provider]  FREESTYLE LITE test strip SMARTSIG:Via Meter 2-3 Times Daily 03/29/20   [provider]  furosemide (LASIX) 80 MG tablet Take 80 mg by mouth See admin instructions. On Non- Dialysis Days, Sun, Mon, Wed, and Fri    [provider]  glucose blood (ONETOUCH ULTRA) test strip  02/01/19  [provider]  insulin NPH-regular Human (70-30) 100 UNIT/ML injection Inject 40-50 Units into the skin See admin instructions. Inject 50 units into the skin in the morning and 40 units at night    [provider]  isosorbide mononitrate (IMDUR) 30 MG 24 hr tablet Take 30 mg by mouth daily.    [provider]  LINZESS 145 MCG CAPS capsule Take 145 mcg by mouth daily.    [provider]  metolazone (ZAROXOLYN) 2.5 MG tablet Take 2.5 mg by mouth once a week.    [provider]  oxyCODONE-acetaminophen (PERCOCET/ROXICET) 5-325 MG tablet Take 1 tablet by mouth every 4 (four) hours as needed. 02/13/22   Nadara Mustard, MD  Semaglutide,0.25 or 0.5MG /DOS, (OZEMPIC, 0.25 OR 0.5 MG/DOSE,) 2  MG/3ML SOPN Inject 0.25 mg into the skin once a week. 01/23/22     sevelamer carbonate (RENVELA) 800 MG tablet Take 1,600 mg by mouth 3 (three) times daily with meals. 07/11/21   [provider]      Allergies    Patient has no known allergies.    Review of Systems   Review of Systems  Constitutional:  Negative for chills and fever.  HENT:  Negative for ear pain and sore throat.   Eyes:  Negative for pain and visual disturbance.  Respiratory:  Negative for cough and shortness of breath.   Cardiovascular:  Negative for chest pain and palpitations.  Gastrointestinal:  Negative for abdominal pain and vomiting.  Genitourinary:  Negative for dysuria and hematuria.  Musculoskeletal:  Negative for arthralgias and back pain.  Skin:  Negative for color change and rash.  Neurological:  Positive for light-headedness. Negative for seizures and syncope.  All other systems reviewed and are negative.   Physical Exam Updated Vital Signs BP (!) 117/42   Pulse 65   Temp 97.8 F (36.6 C) (Oral)   Resp 16   Ht 1.676 m (5\' 6" )   Wt 117.9 kg   SpO2 100%   BMI 41.97 kg/m  Physical Exam Vitals and nursing note reviewed.  Constitutional:      General: She is not in acute distress.    Appearance: Normal appearance. She is well-developed.  HENT:     Head: Normocephalic and atraumatic.     Mouth/Throat:     Mouth: Mucous membranes are moist.  Eyes:     Extraocular Movements: Extraocular movements intact.     Conjunctiva/sclera: Conjunctivae normal.     Pupils: Pupils are equal, round, and reactive to light.  Cardiovascular:     Rate and Rhythm: Normal rate and regular rhythm.     Heart sounds: No murmur heard. Pulmonary:     Effort: Pulmonary effort is normal. No respiratory distress.     Breath sounds: Normal breath sounds.  Abdominal:     Palpations: Abdomen is soft.     Tenderness: There is no abdominal tenderness.  Musculoskeletal:        General: No swelling.     Cervical  back: Neck supple. No rigidity or tenderness.     Comments: Left knee with well-healed surgical scar.  No significant swelling.  Good range of motion.  Right knee with a small inferior abrasion.  No significant swelling good range of motion at both knees ankles and feet.  Dorsalis pedis pulses 1+.  Skin:    General: Skin is warm and dry.     Capillary Refill: Capillary refill takes less than 2 seconds.  Neurological:  General: No focal deficit present.     Mental Status: She is alert and oriented to person, place, and time.  Psychiatric:        Mood and Affect: Mood normal.     ED Results / Procedures / Treatments   Labs (all labs ordered are listed, but only abnormal results are displayed) Labs Reviewed  BASIC METABOLIC PANEL - Abnormal; Notable for the following components:      Result Value   Sodium 133 (*)    Potassium 5.3 (*)    Chloride 92 (*)    Glucose, Bld 338 (*)    BUN 30 (*)    Creatinine, Ser 7.59 (*)    GFR, Estimated 5 (*)    All other components within normal limits  CBC WITH DIFFERENTIAL/PLATELET - Abnormal; Notable for the following components:   RBC 3.80 (*)    MCV 101.1 (*)    All other components within normal limits  CBG MONITORING, ED - Abnormal; Notable for the following components:   Glucose-Capillary 323 (*)    All other components within normal limits  URINALYSIS, ROUTINE W REFLEX MICROSCOPIC    EKG EKG Interpretation Date/Time:  Sunday February 09 2023 12:05:54 EST Ventricular Rate:  82 PR Interval:  228 QRS Duration:  92 QT Interval:  406 QTC Calculation: 474 R Axis:   -24  Text Interpretation: Sinus rhythm with 1st degree A-V block Cannot rule out Anterior infarct , age undetermined Abnormal ECG When compared with ECG of 01-Feb-2022 11:51, PREVIOUS ECG IS PRESENT New since previous tracing First degree A-V block Confirmed by Vanetta Mulders [phone]) on 02/09/2023 4:40:43 PM  Radiology DG Chest Port 1 View Result Date:  02/09/2023 CLINICAL DATA:  Dizziness, weakness. EXAM: PORTABLE CHEST 1 VIEW COMPARISON:  Chest radiograph dated 01/10/2021. FINDINGS: The heart size and mediastinal contours are within normal limits. There is mild bilateral lower lung predominant interstitial opacities. No pleural effusion or pneumothorax. The visualized skeletal structures are unremarkable. IMPRESSION: Mild bilateral lower lung predominant interstitial opacities may reflect atelectasis, mild pulmonary edema, or atypical infection. Electronically Signed   By: Romona Curls M.D.   On: 02/09/2023 17:13    Procedures Procedures    Medications Ordered in ED Medications - No data to display  ED Course/ Medical Decision Making/ A&P                                 Medical Decision Making Amount and/or Complexity of Data Reviewed Radiology: ordered.   Patient blood pressures here are much improved.  Patient without any specific complaints.  No obvious injuries from the fall.  Chest x-ray raise some questions of may be some bilateral lower lobe atelectasis or maybe atypical infection but patient does not really acting as if there is any respiratory infection.  White count 7.1 hemoglobin 12.4 platelets are 155.  Basic metabolic panel sodium 133 blood sugar was 338.  GFR 5 is expected creatinine 7.59 potassium up a little bit at 5.3 but not in the danger zone.   Will have them get patient up just to make sure her blood pressures remained stable.  At rest they have been good.  Patient's orthostatics without any significant abnormalities and patient did not have any symptoms.  Patient's blood pressure is good currently blood sugar in the low 300s reasonable.  Patient's potassium up a little bit at 5.3 but nothing critical.  Patient stable for discharge home and  follow-up with dialysis on Tuesday.   Final Clinical Impression(s) / ED Diagnoses Final diagnoses:  ESRD on dialysis [facility])  Fall, initial encounter  Abrasion of right knee,  initial encounter    Rx / DC Orders ED Discharge Orders     None         Vanetta Mulders, MD 02/09/23 [phone]

## 2023-02-09 NOTE — Discharge Instructions (Signed)
Follow-up with dialysis as scheduled for Tuesday.  Return for any new or worse symptoms.  Blood sugar a little bit elevated here in the low 300s be careful with your diet.  Blood pressure is stabilized out here.

## 2023-02-09 NOTE — ED Provider Triage Note (Signed)
Emergency Medicine Provider Triage Evaluation Note  Chelsey Lee , a 68 y.o. female  was evaluated in triage.  Pt complains of hypotension following fall today at 1130. Reaching for phone sitting in chair and fell on right side. Difficulty with getting up from floor so called EMS. No head injury, LOC, blurred vision  EMS noted BP of 90/40 following getting patient up from floor. CBG 438. She reported some lightheaded but not currently feeling. No HA. Took 20 units this morning per normal then an additional 20 units at home around 1100  No new blurred vision - is having cataract sx this week. No complaints prior to fall  Dialysis T,Th, Sat -- had yesterday  Review of Systems  Positive: Hypotension, r knee lac from fall Negative: CP, SOB, HA  Physical Exam  BP (!) 89/41 (BP Location: Right Arm)   Pulse 78   Temp 97.9 F (36.6 C) (Oral)   Resp 12   Ht 5\' 6"  (1.676 m)   Wt 117.9 kg   SpO2 100%   BMI 41.97 kg/m  Gen:   Awake, no distress  . A&Ox4 Resp:  Normal effort  MSK:   Moves extremities without difficulty. R knee small superficial lacerations Other:    Medical Decision Making  Medically screening exam initiated at 12:22 PM.  Appropriate orders placed.  RONNAE OSMOND was informed that the remainder of the evaluation will be completed by another provider, this initial triage assessment does not replace that evaluation, and the importance of remaining in the ED until their evaluation is complete.  Labs ordered   Judithann Sheen, [place] 02/09/23 1232

## 2023-02-12 ENCOUNTER — Other Ambulatory Visit: Payer: Self-pay | Admitting: Family

## 2023-02-14 ENCOUNTER — Other Ambulatory Visit: Payer: Self-pay | Admitting: Family

## 2023-02-14 ENCOUNTER — Telehealth: Payer: Self-pay | Admitting: Family

## 2023-02-14 MED ORDER — OXYCODONE-ACETAMINOPHEN 5-325 MG PO TABS: 1.0000 | ORAL_TABLET | Freq: Four times a day (QID) | ORAL | 0 refills | Status: DC | PRN

## 2023-02-14 NOTE — Telephone Encounter (Signed)
Pt called stating [facility] on [address] did not receive the it for oxycodone. Pt phone number is [phone]

## 2023-02-14 NOTE — Telephone Encounter (Signed)
Can you resend please see message below.

## 2023-02-17 ENCOUNTER — Telehealth: Payer: Self-pay | Admitting: Physical Medicine and Rehabilitation

## 2023-02-17 NOTE — Telephone Encounter (Signed)
Pt called requesting a call to set an appt for back injection with [facility]. Last injection 01/08/2023. Pt phone number is [phone].

## 2023-02-19 ENCOUNTER — Telehealth: Payer: Self-pay | Admitting: Physical Medicine and Rehabilitation

## 2023-02-19 NOTE — Telephone Encounter (Signed)
Patient returned call asked for a call back. The number to contact patient is [phone]

## 2023-03-05 ENCOUNTER — Ambulatory Visit: Payer: Medicare Other | Admitting: Physical Medicine and Rehabilitation

## 2023-03-10 ENCOUNTER — Ambulatory Visit: Payer: Medicare Other | Admitting: Physical Medicine and Rehabilitation

## 2023-03-10 ENCOUNTER — Encounter: Payer: Self-pay | Admitting: Physical Medicine and Rehabilitation

## 2023-03-10 VITALS — BP 129/65 | HR 71

## 2023-03-10 DIAGNOSIS — G8929 Other chronic pain: Secondary | ICD-10-CM

## 2023-03-10 DIAGNOSIS — M5442 Lumbago with sciatica, left side: Secondary | ICD-10-CM

## 2023-03-10 DIAGNOSIS — M5416 Radiculopathy, lumbar region: Secondary | ICD-10-CM | POA: Diagnosis not present

## 2023-03-10 DIAGNOSIS — R269 Unspecified abnormalities of gait and mobility: Secondary | ICD-10-CM

## 2023-03-10 DIAGNOSIS — M48062 Spinal stenosis, lumbar region with neurogenic claudication: Secondary | ICD-10-CM | POA: Diagnosis not present

## 2023-03-10 NOTE — Progress Notes (Signed)
 Chelsey Lee - 68 y.o. female MRN [id number]  Date of birth: 08-19-1955  Office Visit Note: Visit Date: 03/10/2023 PCP: Andi Devon, MD Referred by: Andi Devon, MD  Subjective: Chief Complaint  Patient presents with   Lower Back - Pain   HPI: Chelsey Lee is a 68 y.o. female who comes in today for evaluation of chronic, worsening and severe left lower back pain radiating to buttock, groin and anterior thigh. Pain ongoing for several years, worsens with bending and standing to perform household tasks such as washing dishes. Also reports increased pain with prolonged walking. She describes pain as excruciating pressing sensation, currently rates as 20 out of 10. She has tried home exercise regimen, rest and use of medications in the past with minimal relief of pain. Lumbar MRI imaging from October of 2024 exhibits moderate multi level spinal canal stenosis at L3-L4 and L4-L5, moderate to severe bilateral foraminal stenosis at L5-S1. She has undergone multiple lumbar epidural steroid injections in our office with minimal relief of pain. Most recent was left L5-S1 interlaminar epidural steroid injection on 01/08/2023. She reports no relief of pain with this procedure. Patient currently ambulates with cane. She denies focal weakness, numbness and tingling. No recent trauma or falls.   Patients course is complicated by chronic kidney disease requiring hemodialysis, diabetes mellitus, hypertension and morbid obesity.        Review of Systems  Musculoskeletal:  Positive for back pain and myalgias.  Neurological:  Negative for tingling, sensory change, focal weakness and weakness.  All other systems reviewed and are negative.  Otherwise per HPI.  Assessment & Plan: Visit Diagnoses:    ICD-10-CM   1. Chronic left-sided low back pain with left-sided sciatica  M54.42 Ambulatory referral to Physical Therapy   G89.29     2. Lumbar radiculopathy  M54.16 Ambulatory referral to  Physical Therapy    3. Spinal stenosis of lumbar region with neurogenic claudication  M48.062 Ambulatory referral to Physical Therapy    4. Abnormality of gait  R26.9 Ambulatory referral to Physical Therapy       Plan: Findings:  Chronic, worsening and severe left sided lower back pain radiating to buttock, groin and anterior thigh. Patient continues to have severe pain despite good conservative therapies such as home exercise regimen, rest and use of medications. Patients clinical presentation and exam are consistent with neurogenic claudication as a result of spinal canal stenosis.  There is multifactorial moderate spinal canal stenosis noted at the level of L3-L4 and L4-L5.  Minimal relief of previous lumbar epidural steroid injections performed in our office.  We discussed treatment plan in detail today.  At this point, would not recommend continuing with interventional spine procedures.  I discussed possibility of referral to our spine surgeon Dr. Willia Craze for evaluation, however patient is not interested in surgical intervention at this time.  I did place referral for short course of formal physical therapy with a focus on manual treatments, core strengthening, gait and balance.  Should her pain persist also discussed the possibility of referral for chronic pain management.  Patient has no questions at this time.  She will follow-up with [place] as needed.  No red flag symptoms noted upon exam today.        Meds & Orders: No orders of the defined types were placed in this encounter.   Orders Placed This Encounter  Procedures   Ambulatory referral to Physical Therapy    Follow-up: Return if symptoms worsen or  fail to improve.   Procedures: No procedures performed      Clinical History: CLINICAL DATA:  Low back pain radiating into the left hip since 2022. No acute injury or prior relevant surgery. Previous injection.   EXAM: MRI LUMBAR SPINE WITHOUT CONTRAST    TECHNIQUE: Multiplanar, multisequence MR imaging of the lumbar spine was performed. No intravenous contrast was administered.   COMPARISON:  Lumbar MRI 02/04/2019   FINDINGS: Segmentation: Conventional anatomy assumed, with the last open disc space designated L5-S1.Concurrent with prior imaging.   Alignment:  Stable chronic grade 1 anterolisthesis at L4-5.   Vertebrae: No worrisome osseous lesion, acute fracture or pars defect. There are multilevel endplate degenerative changes in the thoracolumbar spine with evidence of interval interbody ankylosis at L4-5. Interval chronic appearing Schmorl's node in the superior endplate of L4. Ankylosing lower thoracic paraspinal osteophytes. Underlying congenitally short lumbar pedicles.   Conus medullaris: Extends to the L1 level and appears normal.   Paraspinal and other soft tissues: No significant paraspinal findings.   Disc levels:   Sagittal images demonstrate progressive multilevel spondylosis with ankylosing paraspinal osteophytes in the lower thoracic spine. There is mild right-sided foraminal narrowing at T10-11 and T11-12. No cord deformity.   L1-2: Mild disc bulging with anterior osteophytes. No significant spinal stenosis or foraminal narrowing.   L2-3: Preserved disc height with mild disc bulging and facet hypertrophy. No significant spinal stenosis or foraminal narrowing.   L3-4: Progressive loss of disc height with progressive annular disc bulging, facet and ligamentous hypertrophy. Resulting increased central canal stenosis, now moderate. There is mild to moderate left greater than right lateral recess and foraminal narrowing.   L4-5: Chronic spondylosis with interval interbody ankylosis. Advanced facet disease with probable interfacetal ankylosis. Resulting chronic moderate multifactorial spinal stenosis and moderate lateral recess and foraminal narrowing bilaterally are similar to the previous study.   L5-S1:  Chronic loss of disc height with annular disc bulging and endplate osteophytes. Moderate bilateral facet hypertrophy. Mild spinal stenosis and mild lateral recess narrowing bilaterally. There is chronic moderate to severe foraminal narrowing bilaterally.   IMPRESSION: 1. 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 Evidence for multilevel ankylosing osteophytes in the lower thoracic spine. 2. Progressive spondylosis at L3-4 with resulting moderate multifactorial spinal stenosis and mild to moderate left greater than right lateral recess and foraminal narrowing. 3. Chronic spondylosis at L4-5 with interval interbody and probable interfacetal ankylosis. Resulting chronic moderate multifactorial spinal stenosis and moderate lateral recess and foraminal narrowing bilaterally. 4. Chronic mild multifactorial spinal stenosis and moderate to severe foraminal narrowing bilaterally at L5-S1. 5. No acute findings or clear explanation for the patient's symptoms.     Electronically Signed   By: Carey Bullocks M.D.   On: 11/03/2022 14:51   She reports that she has never smoked. She has never used smokeless tobacco. No results for input(s): "HGBA1C", "LABURIC" in the last 8760 hours.  Objective:  VS:  HT:    WT:   BMI:     BP:129/65  HR:71bpm  TEMP: ( )  RESP:  Physical Exam Vitals and nursing note reviewed.  HENT:     Head: Normocephalic and atraumatic.     Right Ear: External ear normal.     Left Ear: External ear normal.     Nose: Nose normal.     Mouth/Throat:     Mouth: Mucous membranes are moist.  Eyes:     Extraocular Movements:  Extraocular movements intact.  Cardiovascular:     Rate and Rhythm: Normal rate.     Pulses: Normal pulses.  Pulmonary:     Effort: Pulmonary effort is normal.  Abdominal:     General: Abdomen is flat. There is no distension.  Musculoskeletal:         General: Tenderness present.     Cervical back: Normal range of motion.     Comments: Patient is slow to rise from seated position to standing. Good lumbar range of motion. No pain noted with facet loading. 5/5 strength noted with bilateral hip flexion, knee flexion/extension, ankle dorsiflexion/plantarflexion and EHL. No clonus noted bilaterally. No pain upon palpation of greater trochanters. No pain with internal/external rotation of bilateral hips. Sensation intact bilaterally. Negative slump test bilaterally. Ambulates with cane, gait slow and unsteady.   Skin:    General: Skin is warm and dry.     Capillary Refill: Capillary refill takes less than 2 seconds.  Neurological:     Mental Status: She is alert and oriented to person, place, and time.     Gait: Gait abnormal.     Ortho Exam  Imaging: No results found.  Past Medical/Family/Surgical/Social History: Medications & Allergies reviewed per EMR, new medications updated. Patient Active Problem List   Diagnosis Date Noted   OA (osteoarthritis) of knee 02/08/2022   Total knee replacement status, left 02/08/2022   Pain in left hand 06/29/2021   Cerebrovascular disease 05/11/2020   Gout 03/09/2020   Allergic rhinitis 01/25/2020   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   Colon cancer screening 01/25/2020   Gastroesophageal reflux disease without esophagitis 01/25/2020   Hyperlipidemia 01/25/2020   Morbid obesity (HCC) 01/25/2020   History of colonic polyps 01/25/2020   OSA (obstructive sleep apnea) 01/25/2020   Vitamin D deficiency 01/25/2020   Controlled type 2 diabetes mellitus without complication (HCC) 11/10/2019   Chronic kidney disease, stage 4 (severe) (HCC) 06/29/2019   Long term (current) use of insulin (HCC) 06/29/2019   Epidural lipomatosis 03/02/2019   Spondylolisthesis of lumbar region 03/02/2019   Spinal stenosis of lumbar region with neurogenic claudication 03/02/2019   Left wrist tendinitis  09/07/2018   Unilateral primary osteoarthritis, left knee 04/03/2018   Celiac disease 08/21/2016   Abnormal thyroid function test 04/01/2016   Pain in thumb joint with movement of right hand 09/14/2015   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   Moderate major depression, single episode (HCC) 02/02/2010   Diabetic renal disease (HCC) 06/07/2009   Anemia 01/18/2009   Essential hypertension 01/18/2009   Past Medical History:  Diagnosis Date   Arthritis    Diabetes mellitus    ESRD on HD    T Th Sa   GERD (gastroesophageal reflux disease)    Hyperlipemia    Hypertension    Seasonal allergies    Sleep apnea    Family History  Problem Relation Age of Onset   BRCA 1/2 Neg Hx    Breast cancer Neg Hx    Past Surgical History:  Procedure Laterality Date   AV FISTULA PLACEMENT Left 04/10/2020   Procedure: LEFT ARM ARTERIOVENOUS (AV) FISTULA CREATION;  Surgeon: Sherren Kerns, MD;  Location: [facility] OR;  Service: Vascular;  Laterality: Left;   CAPSULOTOMY  01/13/2012   Procedure: MINOR CAPSULOTOMY;  Surgeon: Vita Erm., MD;  Location: [facility] OR;  Service: Ophthalmology;  Laterality: Left;   DORSAL COMPARTMENT RELEASE  01/22/2007   rt    FISTULA SUPERFICIALIZATION Left 05/29/2020   Procedure: LEFT  UPPER ARM FISTULA SUPERFICIALIZATION WITH LIGATION OF MULTIPLE SIDE BRANCHES;  Surgeon: Sherren Kerns, MD;  Location: [facility] OR;  Service: Vascular;  Laterality: Left;   KNEE ARTHROSCOPY  02/21/2002   right and left   STERIOD INJECTION  08/01/2011   Procedure: STEROID INJECTION;  Surgeon: Wyn Forster., MD;  Location: [facility];  Service: Orthopedics;  Laterality: Left;  cmc left thumb   TONSILLECTOMY     TOTAL KNEE ARTHROPLASTY Left 02/08/2022   Procedure: LEFT TOTAL KNEE ARTHROPLASTY;  Surgeon: Nadara Mustard, MD;  Location: [facility] OR;  Service: Orthopedics;  Laterality: Left;   TRIGGER FINGER RELEASE  01/22/2004   rt thumb   TRIGGER FINGER RELEASE  08/01/2011   Procedure:  RELEASE TRIGGER FINGER/A-1 PULLEY;  Surgeon: Wyn Forster., MD;  Location: [facility];  Service: Orthopedics;  Laterality: Left;  left thumb   YAG LASER APPLICATION  01/13/2012   Procedure: YAG LASER APPLICATION;  Surgeon: Vita Erm., MD;  Location: [facility] OR;  Service: Ophthalmology;  Laterality: Left;   Social History   Occupational History   Not on file  Tobacco Use   Smoking status: Never   Smokeless tobacco: Never  Vaping Use   Vaping status: Never Used  Substance and Sexual Activity   Alcohol use: No   Drug use: No   Sexual activity: Not on file

## 2023-03-10 NOTE — Progress Notes (Signed)
 Pain Scale-8 Patient advising the injections she had are not helping her pain

## 2023-03-20 ENCOUNTER — Telehealth: Payer: Self-pay | Admitting: *Deleted

## 2023-03-20 NOTE — Progress Notes (Unsigned)
 HPI F never smoker followed for OSA, complicated by  HTN, Cerebrovascular Disease, Allergic Rhinitis, Celiac Disease, GERD, DM2, Goiter, Epidural Lipomatosis, CKD4/ ESRD/Dialysis, Anemia, Gout, Osteoarthritis,  NPSG 04/04/15 AHI 24.4/ hr, desaturation to 77%, Body weight 278 lbs  ==============================================================   09/16/22- 67 yoF never smoker followed for OSA, complicated by  HTN, Cerebrovascular Disease, Allergic Rhinitis, Celiac Disease, GERD, DM2, Goiter, Epidural Lipomatosis, CKD4/ ESRD/Dialysis, Anemia, Gout, Osteoarthritis,  CPAP  11 / [employer]    AirSense10 AutoSet       replacement was ordered, but denied by insurance until her machine is "broken beyond repair" Download compliance- 10%, AHI 1.4/ hr Body weight-264 lbs -----States she has not been using cpap machine. Had Coivd in June and stopped using then. She was curious about CPAP alternatives, especially Inspire which we discussed.  She is too heavy for now.  We are going to refer her so that she can learn more about an oral appliance alternative.  Meanwhile I have encouraged her to resume using CPAP. She continues dialysis.  Breathing is comfortable.  03/21/23- 67 yoF never smoker followed for OSA, complicated by  HTN, Cerebrovascular Disease, Allergic Rhinitis, Celiac Disease, GERD, DM2, Goiter, Epidural Lipomatosis, CKD4/ ESRD/Dialysis, Anemia, Gout, Osteoarthritis,  CPAP  11 / [employer]    AirSense10 AutoSet       replacement was ordered, but denied by insurance until her machine is "broken beyond repair" Download compliance- AirView only available from late September up to 11/15/22- 44%, AHI 2.5/hr Body weight today- 263 lbs She wears CPAP every night, but hose disconnects frequently She never heard from Dr Myrtis Ser' office to consider an oral appliance as alternative to CPAP, but she is still interested in that alternative to CPAP. CXR 02/09/23   1V MPRESSION: Mild bilateral lower lung predominant  interstitial opacities may reflect atelectasis, mild pulmonary edema, or atypical infection. Discussed the use of AI scribe software for clinical note transcription with the patient, who gave verbal consent to proceed. History of Present Illness   Miss Chelsey Lee, a patient with multiple chronic conditions, presents for a routine follow-up. She is currently undergoing regular dialysis and has not yet discussed the possibility of a transplant. She is planning to have knee replacements and cataract removal, but no dates have been set for these procedures.  She is currently using a CPAP machine for sleep apnea, but has experienced issues with the tubing disconnecting from the mask. Despite this, she reports being able to sleep fairly well with the machine. She was supposed to be contacted about a special dentist for a mouthpiece, but has not heard back. We are repeating that referral today.  She also mentions a recent visit to the emergency room due to low blood pressure, during which an electrocardiogram was performed. I noted murmur today that she was unaware of.     ROS    += positive Constitutional:    weight loss, night sweats, fevers, chills, fatigue, lassitude. HEENT:    headaches, difficulty swallowing, tooth/dental problems, sore throat,       sneezing, itching, ear ache, nasal congestion, post nasal drip, snoring CV:    chest pain, orthopnea, PND, swelling in lower extremities, anasarca,                                   dizziness, palpitations Resp:   shortness of breath with exertion or at rest.  productive cough,   non-productive cough, coughing up of blood.              change in color of mucus.  wheezing.   Skin:    rash or lesions. GI:  No-   heartburn, indigestion, abdominal pain, nausea, vomiting, diarrhea,                 change in bowel habits, loss of appetite GU: dysuria, change in color of urine, no urgency or frequency.   flank pain. MS:   joint pain,  stiffness, decreased range of motion, back pain. Neuro-     nothing unusual Psych:  change in mood or affect.  depression or anxiety.   memory loss.  OBJ- Physical Exam General- Alert, Oriented, Affect-appropriate, Distress- none acute, +obese Skin- rash-none, lesions- none, excoriation- none Lymphadenopathy- none Head- atraumatic            Eyes- Gross vision intact, PERRLA, conjunctivae and secretions clear            Ears- Hearing, canals-normal            Nose- Clear, no-Septal dev, mucus, polyps, erosion, perforation             Throat- Mallampati IV , mucosa clear , drainage- none, tonsils absent, +teeth Neck- flexible , trachea midline, no stridor , thyroid nl, carotid no bruit Chest - symmetrical excursion , unlabored           Heart/CV- RRR ,murmur +1- 2/6S precordial ( possibly flow murmur related to dialysis), no gallop  , no rub, nl s1 s2                           - JVD- none , edema- none, stasis changes- none, varices- none           Lung- clear to P&A, wheeze- none, cough- none , dullness-none, rub- none           Chest wall-  Abd-  Br/ Gen/ Rectal- Not done, not indicated Extrem-+access L arm Neuro- grossly intact to observation Assessment and Plan    Obstructive Sleep Apnea Patient is currently using CPAP but has had issues with the tubing disconnecting from the mask. Discussed the option of an oral appliance. -Refer to Dr. Myrtis Ser for oral appliance consultation. -If no response in 2 weeks, patient to contact this office.  Heart Murmur New finding on today's examination. No symptoms of heart failure. Patient had an echocardiogram in 2016 with no mention of a murmur. -Documented for future reference. -Advised patient to mention this finding to Dr. Renae Gloss at her next appointment.  End Stage Renal Disease Patient is on regular dialysis. -Continue current management.  Pending Surgeries Patient is planning for knee replacements and cataract removal, but no dates  have been set. -No changes to current plan.

## 2023-03-20 NOTE — Telephone Encounter (Signed)
 Patient has follow up office visit tomorrow with Dr. Jetty Duhamel. Left message on her voicemail that if she is currently wearing her CPAP to bring the SD card/CPAP machine to OV. Airview download only goes through 11/15/2022. Last OV note (09/16/2022) states she could also check into an oral appliance as an alternative.

## 2023-03-21 ENCOUNTER — Telehealth: Payer: Self-pay | Admitting: Physical Medicine and Rehabilitation

## 2023-03-21 ENCOUNTER — Encounter: Payer: Self-pay | Admitting: Internal Medicine

## 2023-03-21 ENCOUNTER — Ambulatory Visit: Payer: Medicare Other | Admitting: Internal Medicine

## 2023-03-21 VITALS — BP 120/50 | HR 72 | Temp 98.1°F | Ht 66.0 in | Wt 263.0 lb

## 2023-03-21 DIAGNOSIS — G4733 Obstructive sleep apnea (adult) (pediatric): Secondary | ICD-10-CM | POA: Diagnosis not present

## 2023-03-21 DIAGNOSIS — N186 End stage renal disease: Secondary | ICD-10-CM

## 2023-03-21 DIAGNOSIS — Z992 Dependence on renal dialysis: Secondary | ICD-10-CM | POA: Diagnosis not present

## 2023-03-21 DIAGNOSIS — R011 Cardiac murmur, unspecified: Secondary | ICD-10-CM

## 2023-03-21 NOTE — Patient Instructions (Signed)
 Order- referral to Orthodontist Dr Althea Grimmer    consider oral appliance for OSA  For now, keep wearing your CPAP- try for at least 4 hours per night  You can ask Dr Renae Gloss about your heart murmur. She has probably heard it before.

## 2023-03-21 NOTE — Telephone Encounter (Signed)
 Pt is requesting pain medication. A stronger dose. [facility] on [address] [employer], [place], [place] [address]

## 2023-03-24 ENCOUNTER — Other Ambulatory Visit (HOSPITAL_COMMUNITY): Payer: Self-pay

## 2023-03-24 ENCOUNTER — Other Ambulatory Visit: Payer: Self-pay | Admitting: Physical Medicine and Rehabilitation

## 2023-03-24 ENCOUNTER — Telehealth: Payer: Self-pay | Admitting: Physical Medicine and Rehabilitation

## 2023-03-24 MED ORDER — OXYCODONE-ACETAMINOPHEN 5-325 MG PO TABS: 1.0000 | ORAL_TABLET | Freq: Three times a day (TID) | ORAL | 0 refills | Status: DC | PRN

## 2023-03-24 NOTE — Telephone Encounter (Signed)
 Patient called would like some pain medication called in. Says she is not going to PT unless she has it.

## 2023-03-25 ENCOUNTER — Telehealth: Payer: Self-pay | Admitting: Physical Medicine and Rehabilitation

## 2023-03-25 NOTE — Telephone Encounter (Signed)
 Patient called. Says the oxycodone is not strong enough. Would like prednisone called in.

## 2023-03-26 ENCOUNTER — Ambulatory Visit: Payer: Medicare Other | Admitting: Physical Therapy

## 2023-03-26 ENCOUNTER — Encounter: Payer: Self-pay | Admitting: Physical Therapy

## 2023-03-26 DIAGNOSIS — R2689 Other abnormalities of gait and mobility: Secondary | ICD-10-CM

## 2023-03-26 DIAGNOSIS — M6281 Muscle weakness (generalized): Secondary | ICD-10-CM

## 2023-03-26 DIAGNOSIS — M5459 Other low back pain: Secondary | ICD-10-CM | POA: Diagnosis not present

## 2023-03-26 DIAGNOSIS — M25552 Pain in left hip: Secondary | ICD-10-CM

## 2023-03-26 NOTE — Therapy (Signed)
 OUTPATIENT PHYSICAL THERAPY THORACOLUMBAR EVALUATION   Patient Name: Chelsey Lee MRN: [id number] DOB:12/12/1955, 68 y.o., female Today's Date: 03/26/2023  END OF SESSION:  PT End of Session - 03/26/23 1156     Visit Number 1    Number of Visits 10    Date for PT Re-Evaluation 06/04/23    Authorization Type MCR    PT Start Time 1105    PT Stop Time 1145    PT Time Calculation (min) 40 min    Activity Tolerance Patient tolerated treatment well    Behavior During Therapy WFL for tasks assessed/performed             Past Medical History:  Diagnosis Date   Arthritis    Diabetes mellitus    ESRD on HD    T Th Sa   GERD (gastroesophageal reflux disease)    Hyperlipemia    Hypertension    Seasonal allergies    Sleep apnea    Past Surgical History:  Procedure Laterality Date   AV FISTULA PLACEMENT Left 04/10/2020   Procedure: LEFT ARM ARTERIOVENOUS (AV) FISTULA CREATION;  Surgeon: Sherren Kerns, MD;  Location: [facility] OR;  Service: Vascular;  Laterality: Left;   CAPSULOTOMY  01/13/2012   Procedure: MINOR CAPSULOTOMY;  Surgeon: Vita Erm., MD;  Location: [facility] OR;  Service: Ophthalmology;  Laterality: Left;   DORSAL COMPARTMENT RELEASE  01/22/2007   rt    FISTULA SUPERFICIALIZATION Left 05/29/2020   Procedure: LEFT UPPER ARM FISTULA SUPERFICIALIZATION WITH LIGATION OF MULTIPLE SIDE BRANCHES;  Surgeon: Sherren Kerns, MD;  Location: [facility] OR;  Service: Vascular;  Laterality: Left;   KNEE ARTHROSCOPY  02/21/2002   right and left   STERIOD INJECTION  08/01/2011   Procedure: STEROID INJECTION;  Surgeon: Wyn Forster., MD;  Location: [facility];  Service: Orthopedics;  Laterality: Left;  cmc left thumb   TONSILLECTOMY     TOTAL KNEE ARTHROPLASTY Left 02/08/2022   Procedure: LEFT TOTAL KNEE ARTHROPLASTY;  Surgeon: Nadara Mustard, MD;  Location: [facility] OR;  Service: Orthopedics;  Laterality: Left;   TRIGGER FINGER RELEASE  01/22/2004   rt thumb    TRIGGER FINGER RELEASE  08/01/2011   Procedure: RELEASE TRIGGER FINGER/A-1 PULLEY;  Surgeon: Wyn Forster., MD;  Location: [facility];  Service: Orthopedics;  Laterality: Left;  left thumb   YAG LASER APPLICATION  01/13/2012   Procedure: YAG LASER APPLICATION;  Surgeon: Vita Erm., MD;  Location: [facility] OR;  Service: Ophthalmology;  Laterality: Left;   Patient Active Problem List   Diagnosis Date Noted   OA (osteoarthritis) of knee 02/08/2022   Total knee replacement status, left 02/08/2022   Pain in left hand 06/29/2021   Cerebrovascular disease 05/11/2020   Gout 03/09/2020   Allergic rhinitis 01/25/2020   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   Colon cancer screening 01/25/2020   Gastroesophageal reflux disease without esophagitis 01/25/2020   Hyperlipidemia 01/25/2020   Morbid obesity (HCC) 01/25/2020   History of colonic polyps 01/25/2020   OSA (obstructive sleep apnea) 01/25/2020   Vitamin D deficiency 01/25/2020   Controlled type 2 diabetes mellitus without complication (HCC) 11/10/2019   Chronic kidney disease, stage 4 (severe) (HCC) 06/29/2019   Long term (current) use of insulin (HCC) 06/29/2019   Epidural lipomatosis 03/02/2019   Spondylolisthesis of lumbar region 03/02/2019   Spinal stenosis of lumbar region with neurogenic claudication 03/02/2019   Left wrist tendinitis 09/07/2018   Unilateral  primary osteoarthritis, left knee 04/03/2018   Celiac disease 08/21/2016   Abnormal thyroid function test 04/01/2016   Pain in thumb joint with movement of right hand 09/14/2015   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   Moderate major depression, single episode (HCC) 02/02/2010   Diabetic renal disease (HCC) 06/07/2009   Anemia 01/18/2009   Essential hypertension 01/18/2009    PCP: Andi Devon, MD   REFERRING PROVIDER: Juanda Chance, NP   REFERRING DIAG: [phone] (ICD-10-CM) - Chronic left-sided low back pain with left-sided  sciatica M54.16 (ICD-10-CM) - Lumbar radiculopathy M48.062 (ICD-10-CM) - Spinal stenosis of lumbar region with neurogenic claudication R26.9 (ICD-10-CM) - Abnormality of gait  Rationale for Evaluation and Treatment: Rehabilitation  THERAPY DIAG:  Other low back pain  Muscle weakness (generalized)  Other abnormalities of gait and mobility  Pain in left hip  ONSET DATE: back pain for many years  SUBJECTIVE:                                                                                                                                                                                           SUBJECTIVE STATEMENT: She relays chronic back pain for years that runs down the left leg to her knee. She is supposed to have knee replacement at some point. She is have difficulty with standing any length of time or bending over. Her pain is fine when sitting down resting but gets real bad with standing or walking. She ambulates without AD at home, uses cane or walker outside of house, (walker mostly on dialysis days). She has had back injections but this has not helped.  PERTINENT HISTORY:  See above PMH  PAIN:  NPRS scale: no pain at rest currently, gets up to 10 with standing/10 upon arrival Pain location:low back and down left leg Pain description: intermittent, numbness down left leg Aggravating factors: standing, walking, bending over Relieving factors: rest, sitting   PRECAUTIONS: None  RED FLAGS: None   WEIGHT BEARING RESTRICTIONS: No  FALLS:  Has patient fallen in last 6 months? Yes. Number of falls 1 a month ago, she reached for phone and fell out of chair. She does have fear of falling and would like to work on balance  OCCUPATION: none  PLOF: Independent with basic ADLs  PATIENT GOALS: reduce pain  NEXT MD VISIT: nothing scheduled at eval  OBJECTIVE:  Note: Objective measures were completed at Evaluation unless otherwise noted.  DIAGNOSTIC FINDINGS:  "Lumbar MRI  imaging from October of 2024 exhibits moderate multi level spinal canal stenosis at L3-L4 and L4-L5, moderate to severe bilateral foraminal stenosis at L5-S1."  PATIENT SURVEYS:  Patient-Specific Activity Scoring  Scheme  "0" represents "unable to perform." "10" represents "able to perform at prior level. 0 1 2 3 4 5 6 7 8 9  10 (Date and Score)   Activity Eval     1. standing  1    2. Bending over  1    Score 2/20    Total score = sum of the activity scores/number of activities Minimum detectable change (90%CI) for average score = 2 points Minimum detectable change (90%CI) for single activity score = 3 points     COGNITION: Overall cognitive status: Within functional limits for tasks assessed   LUMBAR ROM:   AROM eval  Flexion 50%  Extension 25%  Right lateral flexion 50%  Left lateral flexion 25% and pain  Right rotation 50%  Left rotation 50% and pain   (Blank rows = not tested)   LOWER EXTREMITY MMT:    MMT in sitting Right eval Left eval  Hip flexion 4 4  Hip extension    Hip abduction 4 4  Hip adduction    Hip internal rotation    Hip external rotation    Knee flexion 4 5  Knee extension 4 4+  Ankle dorsiflexion 4 4  Ankle plantarflexion    Ankle inversion    Ankle eversion     (Blank rows = not tested)  LUMBAR SPECIAL TESTS:  Straight leg raise test: Negative and Slump test: Negative  FUNCTIONAL TESTS:  Eval: Timed up and go (TUG): 37.88 without AD  GAIT:Eval Comments:slow speed and gait instability without AD, uses cane and walker outside of home to help with this.  TODAY'S TREATMENT:  Eval HEP creation and review with demonstration and trial set preformed, see below for details Selfcare:see education below    PATIENT EDUCATION: Education details: HEP, PT plan of care, anatomy of condition and rational for flexion based stretching to help with stenosis. Education to keep exercises gentle without pushing into pain Person educated:  Patient Education method: Explanation, Demonstration, Verbal cues, and Handouts Education comprehension: verbalized understanding and needs further education   HOME EXERCISE PROGRAM: Access Code: [email or web address] URL: [email or web address] Date: 03/26/2023 Prepared by: Ivery Quale  Exercises - Hooklying Single Knee to Chest Stretch  - 2 x daily - 6 x weekly - 1 sets - 2 reps - 20 hold - Supine Lower Trunk Rotation  - 2 x daily - 6 x weekly - 1 sets - 10 reps - 5 sec hold - Seated Hamstring Stretch (Mirrored)  - 2 x daily - 6 x weekly - 1 sets - 2 reps - 30 hold - Seated Side Stretch  - 2 x daily - 6 x weekly - 10 reps - 5 sec hold - Seated Flexion Stretch  - 2 x daily - 6 x weekly - 1 sets - 10 reps - 5 sec hold  ASSESSMENT:  CLINICAL IMPRESSION: Patient referred to PT for Eval and Treat: Chronic lower back pain, left sided radiculopathy.  She also has gait instability and balance deficits. Patient will benefit from skilled PT to address below impairments, limitations and improve overall function.  OBJECTIVE IMPAIRMENTS: decreased activity tolerance, difficulty walking, decreased balance, decreased endurance, decreased mobility, decreased ROM, decreased strength, impaired flexibility, impaired LE use, and pain.  ACTIVITY LIMITATIONS: bending, lifting, carry, locomotion, cleaning, community activity  PERSONAL FACTORS: see above PMH are also affecting patient's functional outcome.  REHAB POTENTIAL: Good  CLINICAL DECISION MAKING: Stable/uncomplicated  EVALUATION COMPLEXITY: Low    GOALS: Short term PT Goals Target date: 04/23/2023  Pt will be I and compliant with HEP. Baseline:  Goal status: New Pt will decrease pain by 25% overall with standing and bending over Baseline: Goal status: New  Long term PT goals Target date:06/04/2023   Pt will improve lumbar AROM to [facility] to improve functional mobility Baseline: Goal status: New Pt will improve  hip/knee strength to  at least 4+-/5 MMT to improve functional strength Baseline: Goal status: New Pt will improve PSFS to at least 10/20 functional to show improved function Baseline:2/20 Goal status: New Pt will reduce pain by overall 50% overall with usual activity Baseline: Goal status: New Pt will reduce pain to overall less than 4/10 with usual activity, standing, walking        6. Pt will improve TUG time to <25 seconds to show improved gait speed and balance.  Baseline:38 sec  PLAN: PT FREQUENCY: 1-2 times per week   PT DURATION: 6-10 weeks  PLANNED INTERVENTIONS (unless contraindicated): aquatic PT, Canalith repositioning, cryotherapy, Electrical stimulation, Iontophoresis with 4 mg/ml dexamethasome, Moist heat, traction, Ultrasound, gait training, Therapeutic exercise, balance training, neuromuscular re-education, patient/family education, manual techniques, passive ROM, dry needling, taping, vasopnuematic device, vestibular, spinal manipulations, joint manipulations 97110-Therapeutic exercises, 97530- Therapeutic activity, 97112- Neuromuscular re-education, 97535- Self Care, and 16109- Manual therapy  PLAN FOR NEXT SESSION: further balance testing an add balance into HEP.   April Manson, PT,DPT 03/26/2023, 11:57 AM

## 2023-03-27 ENCOUNTER — Other Ambulatory Visit: Payer: Self-pay | Admitting: Physical Medicine and Rehabilitation

## 2023-03-27 MED ORDER — PREDNISONE 50 MG PO TABS: 50.0000 mg | ORAL_TABLET | Freq: Every day | ORAL | 0 refills | Status: AC

## 2023-03-28 ENCOUNTER — Ambulatory Visit: Payer: Medicare Other | Admitting: Podiatry

## 2023-03-28 ENCOUNTER — Encounter: Payer: Self-pay | Admitting: Podiatry

## 2023-03-28 DIAGNOSIS — N184 Chronic kidney disease, stage 4 (severe): Secondary | ICD-10-CM

## 2023-03-28 DIAGNOSIS — M79675 Pain in left toe(s): Secondary | ICD-10-CM

## 2023-03-28 DIAGNOSIS — B351 Tinea unguium: Secondary | ICD-10-CM

## 2023-03-28 DIAGNOSIS — E119 Type 2 diabetes mellitus without complications: Secondary | ICD-10-CM | POA: Diagnosis not present

## 2023-03-28 DIAGNOSIS — M79674 Pain in right toe(s): Secondary | ICD-10-CM

## 2023-03-28 NOTE — Progress Notes (Signed)

## 2023-04-07 ENCOUNTER — Telehealth: Payer: Self-pay | Admitting: Internal Medicine

## 2023-04-07 DIAGNOSIS — G4733 Obstructive sleep apnea (adult) (pediatric): Secondary | ICD-10-CM

## 2023-04-07 NOTE — Telephone Encounter (Signed)
 Dr. Maple Hudson to PT to call us if she had not heard from Dr. Myrtis Ser office in 2 weeks. She has not. Referral looks like it was closed too. Please call PT to advise @ [phone]

## 2023-04-07 NOTE — Telephone Encounter (Signed)
 Called patient.  Patient does want to be referred for oral appliance.  Will place referral again.

## 2023-04-09 ENCOUNTER — Encounter: Payer: Self-pay | Admitting: Physical Therapy

## 2023-04-09 ENCOUNTER — Ambulatory Visit (INDEPENDENT_AMBULATORY_CARE_PROVIDER_SITE_OTHER): Admitting: Physical Therapy

## 2023-04-09 DIAGNOSIS — M5459 Other low back pain: Secondary | ICD-10-CM

## 2023-04-09 DIAGNOSIS — R2689 Other abnormalities of gait and mobility: Secondary | ICD-10-CM

## 2023-04-09 DIAGNOSIS — M6281 Muscle weakness (generalized): Secondary | ICD-10-CM | POA: Diagnosis not present

## 2023-04-09 DIAGNOSIS — M25552 Pain in left hip: Secondary | ICD-10-CM

## 2023-04-09 NOTE — Therapy (Signed)
 OUTPATIENT PHYSICAL THERAPY THORACOLUMBAR EVALUATION   Patient Name: Chelsey Lee MRN: [id number] DOB:[birth year], 68 y.o., female Today's Date: 04/09/2023  END OF SESSION:  PT End of Session - 04/09/23 1140     Visit Number 2    Number of Visits 10    Date for PT Re-Evaluation 06/04/23    Authorization Type MCR    PT Start Time 1100    PT Stop Time 1142    PT Time Calculation (min) 42 min    Activity Tolerance Patient tolerated treatment well    Behavior During Therapy WFL for tasks assessed/performed              Past Medical History:  Diagnosis Date   Arthritis    Diabetes mellitus    ESRD on HD    T Th Sa   GERD (gastroesophageal reflux disease)    Hyperlipemia    Hypertension    Seasonal allergies    Sleep apnea    Past Surgical History:  Procedure Laterality Date   AV FISTULA PLACEMENT Left 04/10/2020   Procedure: LEFT ARM ARTERIOVENOUS (AV) FISTULA CREATION;  Surgeon: Sherren Kerns, MD;  Location: [facility] OR;  Service: Vascular;  Laterality: Left;   CAPSULOTOMY  01/13/2012   Procedure: MINOR CAPSULOTOMY;  Surgeon: Vita Erm., MD;  Location: [facility] OR;  Service: Ophthalmology;  Laterality: Left;   DORSAL COMPARTMENT RELEASE  01/22/2007   rt    FISTULA SUPERFICIALIZATION Left 05/29/2020   Procedure: LEFT UPPER ARM FISTULA SUPERFICIALIZATION WITH LIGATION OF MULTIPLE SIDE BRANCHES;  Surgeon: Sherren Kerns, MD;  Location: [facility] OR;  Service: Vascular;  Laterality: Left;   KNEE ARTHROSCOPY  02/21/2002   right and left   STERIOD INJECTION  08/01/2011   Procedure: STEROID INJECTION;  Surgeon: Wyn Forster., MD;  Location: [facility];  Service: Orthopedics;  Laterality: Left;  cmc left thumb   TONSILLECTOMY     TOTAL KNEE ARTHROPLASTY Left 02/08/2022   Procedure: LEFT TOTAL KNEE ARTHROPLASTY;  Surgeon: Nadara Mustard, MD;  Location: [facility] OR;  Service: Orthopedics;  Laterality: Left;   TRIGGER FINGER RELEASE  01/22/2004   rt thumb    TRIGGER FINGER RELEASE  08/01/2011   Procedure: RELEASE TRIGGER FINGER/A-1 PULLEY;  Surgeon: Wyn Forster., MD;  Location: [facility];  Service: Orthopedics;  Laterality: Left;  left thumb   YAG LASER APPLICATION  01/13/2012   Procedure: YAG LASER APPLICATION;  Surgeon: Vita Erm., MD;  Location: [facility] OR;  Service: Ophthalmology;  Laterality: Left;   Patient Active Problem List   Diagnosis Date Noted   OA (osteoarthritis) of knee 02/08/2022   Total knee replacement status, left 02/08/2022   Pain in left hand 06/29/2021   Cerebrovascular disease 05/11/2020   Gout 03/09/2020   Allergic rhinitis 01/25/2020   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   Colon cancer screening 01/25/2020   Gastroesophageal reflux disease without esophagitis 01/25/2020   Hyperlipidemia 01/25/2020   Morbid obesity (HCC) 01/25/2020   History of colonic polyps 01/25/2020   OSA (obstructive sleep apnea) 01/25/2020   Vitamin D deficiency 01/25/2020   Controlled type 2 diabetes mellitus without complication (HCC) 11/10/2019   Chronic kidney disease, stage 4 (severe) (HCC) 06/29/2019   Long term (current) use of insulin (HCC) 06/29/2019   Epidural lipomatosis 03/02/2019   Spondylolisthesis of lumbar region 03/02/2019   Spinal stenosis of lumbar region with neurogenic claudication 03/02/2019   Left wrist tendinitis 09/07/2018  Unilateral primary osteoarthritis, left knee 04/03/2018   Celiac disease 08/21/2016   Abnormal thyroid function test 04/01/2016   Pain in thumb joint with movement of right hand 09/14/2015   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   Moderate major depression, single episode (HCC) 02/02/2010   Diabetic renal disease (HCC) 06/07/2009   Anemia 01/18/2009   Essential hypertension 01/18/2009    PCP: Andi Devon, MD   REFERRING PROVIDER: Juanda Chance, NP   REFERRING DIAG: [phone] (ICD-10-CM) - Chronic left-sided low back pain with left-sided  sciatica M54.16 (ICD-10-CM) - Lumbar radiculopathy M48.062 (ICD-10-CM) - Spinal stenosis of lumbar region with neurogenic claudication R26.9 (ICD-10-CM) - Abnormality of gait  Rationale for Evaluation and Treatment: Rehabilitation  THERAPY DIAG:  Other low back pain  Muscle weakness (generalized)  Other abnormalities of gait and mobility  Pain in left hip  ONSET DATE: back pain for many years  SUBJECTIVE:                                                                                                                                                                                           SUBJECTIVE STATEMENT: She says the back pain is about the same and that the pain runs down to her knee  PERTINENT HISTORY:  See above PMH  PAIN:  NPRS scale: no pain at rest currently, gets up to 10 with standing/10 upon arrival Pain location:low back and down left leg Pain description: intermittent, numbness down left leg Aggravating factors: standing, walking, bending over Relieving factors: rest, sitting   PRECAUTIONS: None  RED FLAGS: None   WEIGHT BEARING RESTRICTIONS: No  FALLS:  Has patient fallen in last 6 months? Yes. Number of falls 1 a month ago, she reached for phone and fell out of chair. She does have fear of falling and would like to work on balance  OCCUPATION: none  PLOF: Independent with basic ADLs  PATIENT GOALS: reduce pain  NEXT MD VISIT: nothing scheduled at eval  OBJECTIVE:  Note: Objective measures were completed at Evaluation unless otherwise noted.  DIAGNOSTIC FINDINGS:  "Lumbar MRI imaging from October of 2024 exhibits moderate multi level spinal canal stenosis at L3-L4 and L4-L5, moderate to severe bilateral foraminal stenosis at L5-S1."  PATIENT SURVEYS:  Patient-Specific Activity Scoring Scheme  "0" represents "unable to perform." "10" represents "able to perform at prior level. 0 1 2 3 4 5 6 7 8 9  10 (Date and Score)   Activity Eval      1. standing  1    2. Bending over  1    Score 2/20    Total score = sum of the activity  scores/number of activities Minimum detectable change (90%CI) for average score = 2 points Minimum detectable change (90%CI) for single activity score = 3 points     COGNITION: Overall cognitive status: Within functional limits for tasks assessed   LUMBAR ROM:   AROM eval  Flexion 50%  Extension 25%  Right lateral flexion 50%  Left lateral flexion 25% and pain  Right rotation 50%  Left rotation 50% and pain   (Blank rows = not tested)   LOWER EXTREMITY MMT:    MMT in sitting Right eval Left eval  Hip flexion 4 4  Hip extension    Hip abduction 4 4  Hip adduction    Hip internal rotation    Hip external rotation    Knee flexion 4 5  Knee extension 4 4+  Ankle dorsiflexion 4 4  Ankle plantarflexion    Ankle inversion    Ankle eversion     (Blank rows = not tested)  LUMBAR SPECIAL TESTS:  Straight leg raise test: Negative and Slump test: Negative  FUNCTIONAL TESTS:  Eval: Timed up and go (TUG): 37.88 without AD  GAIT:Eval Comments:slow speed and gait instability without AD, uses cane and walker outside of home to help with this.  TODAY'S TREATMENT:  04/09/23 Nu step L5 X 8 min UE/LE HEP review with education provided Supine LTR 5 sec X 10 bilat Supine SKTC stretch 30 sec X 3 bilat (needed strap to help with Rt side) Supine bridges 5 sec X10 Supine clams green 2X10 Seated SLR 2X10 bilat Seated lumbar flexion pball roll outs 5 sec X 10 Seated hip flexion isometric with p ball 5 sec X 10 bilat   Eval HEP creation and review with demonstration and trial set preformed, see below for details Selfcare:see education below    PATIENT EDUCATION: Education details: HEP, PT plan of care, anatomy of condition and rational for flexion based stretching to help with stenosis. Education to keep exercises gentle without pushing into pain Person educated: Patient Education  method: Explanation, Demonstration, Verbal cues, and Handouts Education comprehension: verbalized understanding and needs further education   HOME EXERCISE PROGRAM: Access Code: [email or web address] URL: [email or web address] Date: 03/26/2023 Prepared by: Ivery Quale  Exercises - Hooklying Single Knee to Chest Stretch  - 2 x daily - 6 x weekly - 1 sets - 2 reps - 20 hold - Supine Lower Trunk Rotation  - 2 x daily - 6 x weekly - 1 sets - 10 reps - 5 sec hold - Seated Hamstring Stretch (Mirrored)  - 2 x daily - 6 x weekly - 1 sets - 2 reps - 30 hold - Seated Side Stretch  - 2 x daily - 6 x weekly - 10 reps - 5 sec hold - Seated Flexion Stretch  - 2 x daily - 6 x weekly - 1 sets - 10 reps - 5 sec hold  ASSESSMENT:  CLINICAL IMPRESSION: Session focused on HEP review for lumbar/hip mobility and strength work with good overall tolerance noted.   OBJECTIVE IMPAIRMENTS: decreased activity tolerance, difficulty walking, decreased balance, decreased endurance, decreased mobility, decreased ROM, decreased strength, impaired flexibility, impaired LE use, and pain.  ACTIVITY LIMITATIONS: bending, lifting, carry, locomotion, cleaning, community activity  PERSONAL FACTORS: see above PMH are also affecting patient's functional outcome.  REHAB POTENTIAL: Good  CLINICAL DECISION MAKING: Stable/uncomplicated  EVALUATION COMPLEXITY: Low    GOALS: Short term PT Goals Target date: 04/23/2023   Pt will be I and compliant with HEP. Baseline:  Goal status:  New Pt will decrease pain by 25% overall with standing and bending over Baseline: Goal status: New  Long term PT goals Target date:06/04/2023   Pt will improve lumbar AROM to [facility] to improve functional mobility Baseline: Goal status: New Pt will improve  hip/knee strength to at least 4+-/5 MMT to improve functional strength Baseline: Goal status: New Pt will improve PSFS to at least 10/20 functional to show improved  function Baseline:2/20 Goal status: New Pt will reduce pain by overall 50% overall with usual activity Baseline: Goal status: New Pt will reduce pain to overall less than 4/10 with usual activity, standing, walking        6. Pt will improve TUG time to <25 seconds to show improved gait speed and balance.  Baseline:38 sec  PLAN: PT FREQUENCY: 1-2 times per week   PT DURATION: 6-10 weeks  PLANNED INTERVENTIONS (unless contraindicated): aquatic PT, Canalith repositioning, cryotherapy, Electrical stimulation, Iontophoresis with 4 mg/ml dexamethasome, Moist heat, traction, Ultrasound, gait training, Therapeutic exercise, balance training, neuromuscular re-education, patient/family education, manual techniques, passive ROM, dry needling, taping, vasopnuematic device, vestibular, spinal manipulations, joint manipulations 97110-Therapeutic exercises, 97530- Therapeutic activity, 97112- Neuromuscular re-education, 97535- Self Care, and 81191- Manual therapy  PLAN FOR NEXT SESSION: lumbar/hip mobility and strength, gait and balance progressions as able.   April Manson, PT,DPT 04/09/2023, 11:41 AM

## 2023-04-16 ENCOUNTER — Ambulatory Visit (INDEPENDENT_AMBULATORY_CARE_PROVIDER_SITE_OTHER): Admitting: Physical Therapy

## 2023-04-16 ENCOUNTER — Encounter: Payer: Self-pay | Admitting: Physical Therapy

## 2023-04-16 DIAGNOSIS — M6281 Muscle weakness (generalized): Secondary | ICD-10-CM | POA: Diagnosis not present

## 2023-04-16 DIAGNOSIS — R2689 Other abnormalities of gait and mobility: Secondary | ICD-10-CM

## 2023-04-16 DIAGNOSIS — M25552 Pain in left hip: Secondary | ICD-10-CM | POA: Diagnosis not present

## 2023-04-16 DIAGNOSIS — M5459 Other low back pain: Secondary | ICD-10-CM | POA: Diagnosis not present

## 2023-04-16 NOTE — Therapy (Signed)
 OUTPATIENT PHYSICAL THERAPY THORACOLUMBAR TREATMENT/Discharge PHYSICAL THERAPY DISCHARGE SUMMARY  Visits from Start of Care: 3  Current functional level related to goals / functional outcomes: See below   Remaining deficits: See below   Education / Equipment: HEP  Plan:  Patient goals were not met. Patient is being discharged due to lack of progress with PT regarding her pain. PT recommending discharge with referral back to MD. She may continue with HEP.       Patient Name: Chelsey Lee MRN: [id number] DOB:[birth year], 68 y.o., female Today's Date: 04/16/2023  END OF SESSION:  PT End of Session - 04/16/23 1301     Visit Number 3    Number of Visits 10    Date for PT Re-Evaluation 06/04/23    Authorization Type MCR    PT Start Time 1300    PT Stop Time 1340    PT Time Calculation (min) 40 min    Activity Tolerance Patient tolerated treatment well    Behavior During Therapy WFL for tasks assessed/performed              Past Medical History:  Diagnosis Date   Arthritis    Diabetes mellitus    ESRD on HD    T Th Sa   GERD (gastroesophageal reflux disease)    Hyperlipemia    Hypertension    Seasonal allergies    Sleep apnea    Past Surgical History:  Procedure Laterality Date   AV FISTULA PLACEMENT Left 04/10/2020   Procedure: LEFT ARM ARTERIOVENOUS (AV) FISTULA CREATION;  Surgeon: Sherren Kerns, MD;  Location: [facility] OR;  Service: Vascular;  Laterality: Left;   CAPSULOTOMY  01/13/2012   Procedure: MINOR CAPSULOTOMY;  Surgeon: Vita Erm., MD;  Location: [facility] OR;  Service: Ophthalmology;  Laterality: Left;   DORSAL COMPARTMENT RELEASE  01/22/2007   rt    FISTULA SUPERFICIALIZATION Left 05/29/2020   Procedure: LEFT UPPER ARM FISTULA SUPERFICIALIZATION WITH LIGATION OF MULTIPLE SIDE BRANCHES;  Surgeon: Sherren Kerns, MD;  Location: [facility] OR;  Service: Vascular;  Laterality: Left;   KNEE ARTHROSCOPY  02/21/2002   right and left   STERIOD  INJECTION  08/01/2011   Procedure: STEROID INJECTION;  Surgeon: Wyn Forster., MD;  Location: [facility];  Service: Orthopedics;  Laterality: Left;  [facility] left thumb   TONSILLECTOMY     TOTAL KNEE ARTHROPLASTY Left 02/08/2022   Procedure: LEFT TOTAL KNEE ARTHROPLASTY;  Surgeon: Nadara Mustard, MD;  Location: [facility] OR;  Service: Orthopedics;  Laterality: Left;   TRIGGER FINGER RELEASE  01/22/2004   rt thumb   TRIGGER FINGER RELEASE  08/01/2011   Procedure: RELEASE TRIGGER FINGER/A-1 PULLEY;  Surgeon: Wyn Forster., MD;  Location: [facility];  Service: Orthopedics;  Laterality: Left;  left thumb   YAG LASER APPLICATION  01/13/2012   Procedure: YAG LASER APPLICATION;  Surgeon: Vita Erm., MD;  Location: [facility] OR;  Service: Ophthalmology;  Laterality: Left;   Patient Active Problem List   Diagnosis Date Noted   OA (osteoarthritis) of knee 02/08/2022   Total knee replacement status, left 02/08/2022   Pain in left hand 06/29/2021   Cerebrovascular disease 05/11/2020   Gout 03/09/2020   Allergic rhinitis 01/25/2020   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   Colon cancer screening 01/25/2020   Gastroesophageal reflux disease without esophagitis 01/25/2020   Hyperlipidemia 01/25/2020   Morbid obesity (HCC) 01/25/2020   History of colonic polyps 01/25/2020  OSA (obstructive sleep apnea) 01/25/2020   Vitamin D deficiency 01/25/2020   Controlled type 2 diabetes mellitus without complication (HCC) 11/10/2019   Chronic kidney disease, stage 4 (severe) (HCC) 06/29/2019   Long term (current) use of insulin (HCC) 06/29/2019   Epidural lipomatosis 03/02/2019   Spondylolisthesis of lumbar region 03/02/2019   Spinal stenosis of lumbar region with neurogenic claudication 03/02/2019   Left wrist tendinitis 09/07/2018   Unilateral primary osteoarthritis, left knee 04/03/2018   Celiac disease 08/21/2016   Abnormal thyroid function test 04/01/2016    Pain in thumb joint with movement of right hand 09/14/2015   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   Moderate major depression, single episode (HCC) 02/02/2010   Diabetic renal disease (HCC) 06/07/2009   Anemia 01/18/2009   Essential hypertension 01/18/2009    PCP: Andi Devon, MD   REFERRING PROVIDER: Juanda Chance, NP   REFERRING DIAG: [phone] (ICD-10-CM) - Chronic left-sided low back pain with left-sided sciatica M54.16 (ICD-10-CM) - Lumbar radiculopathy M48.062 (ICD-10-CM) - Spinal stenosis of lumbar region with neurogenic claudication R26.9 (ICD-10-CM) - Abnormality of gait  Rationale for Evaluation and Treatment: Rehabilitation  THERAPY DIAG:  Other low back pain  Muscle weakness (generalized)  Other abnormalities of gait and mobility  Pain in left hip  ONSET DATE: back pain for many years  SUBJECTIVE:                                                                                                                                                                                           SUBJECTIVE STATEMENT: She says the back and hip pain have not improved any with PT. She only gets temporary relief then the pain comes right back. She also mentions she pulled a muscle in her right shoulder last week when doing laundry.   PERTINENT HISTORY:  See above PMH  PAIN:  NPRS scale: no pain at rest currently, gets up to 10 with standing/10 upon arrival Pain location:low back and down left leg Pain description: intermittent, numbness down left leg Aggravating factors: standing, walking, bending over Relieving factors: rest, sitting   PRECAUTIONS: None  RED FLAGS: None   WEIGHT BEARING RESTRICTIONS: No  FALLS:  Has patient fallen in last 6 months? Yes. Number of falls 1 a month ago, she reached for phone and fell out of chair. She does have fear of falling and would like to work on balance  OCCUPATION: none  PLOF: Independent with basic ADLs  PATIENT  GOALS: reduce pain  NEXT MD VISIT: nothing scheduled at eval  OBJECTIVE:  Note: Objective measures were completed at Evaluation unless otherwise noted.  DIAGNOSTIC FINDINGS:  "Lumbar  MRI imaging from October of 2024 exhibits moderate multi level spinal canal stenosis at L3-L4 and L4-L5, moderate to severe bilateral foraminal stenosis at L5-S1."  PATIENT SURVEYS:  Patient-Specific Activity Scoring Scheme  "0" represents "unable to perform." "10" represents "able to perform at prior level. 0 1 2 3 4 5 6 7 8 9  10 (Date and Score)   Activity Eval  04/16/23   1. standing  1  5  2. Bending over  1  1  Score 2/20 6/20   Total score = sum of the activity scores/number of activities Minimum detectable change (90%CI) for average score = 2 points Minimum detectable change (90%CI) for single activity score = 3 points     COGNITION: Overall cognitive status: Within functional limits for tasks assessed   LUMBAR ROM:   AROM eval 04/16/23  Flexion 50% 90% if has UE support for balance  Extension 25% 25%  Right lateral flexion 50% 50% and pain  Left lateral flexion 25% and pain 50%  Right rotation 50% 50%  Left rotation 50% and pain 50%   (Blank rows = not tested)   LOWER EXTREMITY MMT:    MMT in sitting Right eval Left eval Rt/Lt 04/16/23  Hip flexion 4 4 4+/4+  Hip extension     Hip abduction 4 4 4+/4+  Hip adduction     Hip internal rotation     Hip external rotation     Knee flexion 4 5 4/5  Knee extension 4 4+ 4/5  Ankle dorsiflexion 4 4   Ankle plantarflexion     Ankle inversion     Ankle eversion      (Blank rows = not tested)  LUMBAR SPECIAL TESTS:  Straight leg raise test: Negative and Slump test: Negative  FUNCTIONAL TESTS:  Eval: Timed up and go (TUG): 37.88 without AD 04/16/23:TUG: TUG 35.9 seconds   GAIT:Eval Comments:slow speed and gait instability without AD, uses cane and walker outside of home to help with this.  TODAY'S TREATMENT:   04/16/23 Updated measurements and goals HEP review with education provided Supine LTR 5 sec X 10 bilat Supine bridges 5 sec X10 Supine clams green 2X10 Supine SLR 2X5 bilat Seated lumbar flexion pball roll outs 5 sec X 10 Seated hip flexion isometric with p ball 5 sec X 10 bilat  04/09/23 Nu step L5 X 8 min UE/LE HEP review with education provided Supine LTR 5 sec X 10 bilat Supine SKTC stretch 30 sec X 3 bilat (needed strap to help with Rt side) Supine bridges 5 sec X10 Supine clams green 2X10 Seated SLR 2X10 bilat Seated lumbar flexion pball roll outs 5 sec X 10 Seated hip flexion isometric with p ball 5 sec X 10 bilat  PATIENT EDUCATION: Education details: HEP, PT plan of care, anatomy of condition and rational for flexion based stretching to help with stenosis. Education to keep exercises gentle without pushing into pain Person educated: Patient Education method: Explanation, Demonstration, Verbal cues, and Handouts Education comprehension: verbalized understanding and needs further education   HOME EXERCISE PROGRAM: Access Code: [email or web address] URL: [email or web address] Date: 03/26/2023 Prepared by: Ivery Quale  Exercises - Hooklying Single Knee to Chest Stretch  - 2 x daily - 6 x weekly - 1 sets - 2 reps - 20 hold - Supine Lower Trunk Rotation  - 2 x daily - 6 x weekly - 1 sets - 10 reps - 5 sec hold - Seated Hamstring Stretch (Mirrored)  - 2 x daily - 6  x weekly - 1 sets - 2 reps - 30 hold - Seated Side Stretch  - 2 x daily - 6 x weekly - 10 reps - 5 sec hold - Seated Flexion Stretch  - 2 x daily - 6 x weekly - 1 sets - 10 reps - 5 sec hold  ASSESSMENT:  CLINICAL IMPRESSION:  She has attended 3 PT visits and states her back and hip pain is still the same. She has not had any significant pain relief with treatments, only short term benefits and then the pain comes right back.She has made some small improvements with strength, ROM, and timed up and go test  however she is still very limited in these areas and this has not correlated to any less pian. Patient goals were not met. Patient is being discharged due to lack of progress with PT regarding her pain. PT recommending discharge with referral back to MD. She may continue with HEP. I also recommend MD check out her right bicep/shoulder as she says she felt a pop in in it with pain when doing laundry.   OBJECTIVE IMPAIRMENTS: decreased activity tolerance, difficulty walking, decreased balance, decreased endurance, decreased mobility, decreased ROM, decreased strength, impaired flexibility, impaired LE use, and pain.  ACTIVITY LIMITATIONS: bending, lifting, carry, locomotion, cleaning, community activity  PERSONAL FACTORS: see above PMH are also affecting patient's functional outcome.  REHAB POTENTIAL: Good  CLINICAL DECISION MAKING: Stable/uncomplicated  EVALUATION COMPLEXITY: Low    GOALS: Short term PT Goals Target date: 04/23/2023   Pt will be I and compliant with HEP. Baseline:  Goal status: met 04/16/23 Pt will decrease pain by 25% overall with standing and bending over Baseline: Goal status: not met 04/16/23  Long term PT goals Target date:06/04/2023   Pt will improve lumbar AROM to [facility] to improve functional mobility Baseline: Goal status:  not met 04/16/23 Pt will improve  hip/knee strength to at least 4+-/5 MMT to improve functional strength Baseline: Goal status:  not met 04/16/23 Pt will improve PSFS to at least 10/20 functional to show improved function Baseline:2/20 Goal status:  not met 04/16/23, did improve to 6/20 Pt will reduce pain by overall 50% overall with usual activity Baseline: Goal status:  not met 04/16/23 Pt will reduce pain to overall less than 4/10 with usual activity, standing, walking  Goal status:  not met 04/16/23       6. Pt will improve TUG time to <25 seconds to show improved gait speed and balance.  Baseline:38 sec  Goal status:  not met  04/16/23  PLAN: PT FREQUENCY: 1-2 times per week   PT DURATION: 6-10 weeks  PLANNED INTERVENTIONS (unless contraindicated): aquatic PT, Canalith repositioning, cryotherapy, Electrical stimulation, Iontophoresis with 4 mg/ml dexamethasome, Moist heat, traction, Ultrasound, gait training, Therapeutic exercise, balance training, neuromuscular re-education, patient/family education, manual techniques, passive ROM, dry needling, taping, vasopnuematic device, vestibular, spinal manipulations, joint manipulations 97110-Therapeutic exercises, 97530- Therapeutic activity, 97112- Neuromuscular re-education, 97535- Self Care, and 96045- Manual therapy  PLAN FOR NEXT SESSION: DC today due to lack of progress and will refer back to MD.  April Manson, PT,DPT 04/16/2023, 1:04 PM

## 2023-04-23 ENCOUNTER — Encounter: Admitting: Physical Therapy

## 2023-04-25 ENCOUNTER — Encounter: Payer: Self-pay | Admitting: Physical Medicine and Rehabilitation

## 2023-04-25 ENCOUNTER — Ambulatory Visit: Admitting: Physical Medicine and Rehabilitation

## 2023-04-25 ENCOUNTER — Telehealth: Payer: Self-pay | Admitting: Physical Medicine and Rehabilitation

## 2023-04-25 DIAGNOSIS — M545 Low back pain, unspecified: Secondary | ICD-10-CM

## 2023-04-25 DIAGNOSIS — G8929 Other chronic pain: Secondary | ICD-10-CM

## 2023-04-25 DIAGNOSIS — M47816 Spondylosis without myelopathy or radiculopathy, lumbar region: Secondary | ICD-10-CM

## 2023-04-25 DIAGNOSIS — R269 Unspecified abnormalities of gait and mobility: Secondary | ICD-10-CM

## 2023-04-25 NOTE — Progress Notes (Signed)
 Chelsey Lee - 68 y.o. female MRN [id number]  Date of birth: 13-Nov-1955  Office Visit Note: Visit Date: 04/25/2023 PCP: Andi Devon, MD Referred by: Andi Devon, MD  Subjective: Chief Complaint  Patient presents with   Lower Back - Pain   HPI: Chelsey Lee is a 68 y.o. female who comes in today for evaluation of chronic, worsening and severe left lower back pain radiating to buttock, groin and anterior thigh. Pain ongoing for several years, worsens with bending and standing to perform household tasks such as washing dishes. She describes her pain as sore, aching and burning sensation, currently rates as 8 out of 10. Some relief of pain with home exercise regimen, rest and use of medications. Recent short course of formal physical therapy did not provide relief of pain. Does take Percocet intermittently for severe pain. Lumbar MRI imaging from October of 2024 exhibits moderate multi level spinal canal stenosis at L3-L4 and L4-L5, moderate to severe bilateral foraminal stenosis at L5-S1. There is advanced facet disease at L4-L5 and moderate facet arthropathy at L5-S1. She has undergone multiple lumbar epidural steroid injections in our office with minimal relief of pain. Most recent was left L5-S1 interlaminar epidural steroid injection on 01/08/2023. She reports no relief of pain with this procedure. Patient currently ambulates with walker. She denies focal weakness, numbness and tingling. No recent trauma or falls.   Patients course is complicated by chronic kidney disease requiring hemodialysis, diabetes mellitus, hypertension and morbid obesity.        Review of Systems  Musculoskeletal:  Positive for back pain, joint pain and myalgias.  Neurological:  Negative for tingling, sensory change, focal weakness and weakness.  All other systems reviewed and are negative.  Otherwise per HPI.  Assessment & Plan: Visit Diagnoses:    ICD-10-CM   1. Chronic left-sided low  back pain without sciatica  M54.50    G89.29     2. Facet arthropathy, lumbar  M47.816     3. Abnormality of gait  R26.9        Plan: Findings:  Chronic, worsening and severe left sided lower back pain, intermittent radiation of pain to buttock, groin and anterior thigh. Patient continues to have severe pain despite good conservative therapies such as formal physical therapy, home exercise regimen, rest and use of medications. Patients clinical presentation and exam are complex, she does have moderate multi level central stenosis. Her pain today seems more facet mediated, mostly left lower back and worsens with standing to perform house hold tasks. We discussed treatment plan in detail today. Next step is to perform diagnostic and hopefully therapeutic left L4-L5 and L5-S1 facet joint injections under fluoroscopic guidance. Should her pain persist would consider referral to our spine surgeon Dr. Willia Craze vs chronic pain management. I did refill short course of Percocet for her today while we are waiting to get her in for facet injections. She has no questions at this time. No red flag symptoms noted upon exam today.     Meds & Orders: No orders of the defined types were placed in this encounter.  No orders of the defined types were placed in this encounter.   Follow-up: Return for Left L4-L5 and L5-S1 facet joint injections.   Procedures: No procedures performed      Clinical History: CLINICAL DATA:  Low back pain radiating into the left hip since 2022. No acute injury or prior relevant surgery. Previous injection.   EXAM: MRI LUMBAR SPINE WITHOUT CONTRAST  TECHNIQUE: Multiplanar, multisequence MR imaging of the lumbar spine was performed. No intravenous contrast was administered.   COMPARISON:  Lumbar MRI 02/04/2019   FINDINGS: Segmentation: Conventional anatomy assumed, with the last open disc space designated L5-S1.Concurrent with prior imaging.   Alignment:  Stable  chronic grade 1 anterolisthesis at L4-5.   Vertebrae: No worrisome osseous lesion, acute fracture or pars defect. There are multilevel endplate degenerative changes in the thoracolumbar spine with evidence of interval interbody ankylosis at L4-5. Interval chronic appearing Schmorl's node in the superior endplate of L4. Ankylosing lower thoracic paraspinal osteophytes. Underlying congenitally short lumbar pedicles.   Conus medullaris: Extends to the L1 level and appears normal.   Paraspinal and other soft tissues: No significant paraspinal findings.   Disc levels:   Sagittal images demonstrate progressive multilevel spondylosis with ankylosing paraspinal osteophytes in the lower thoracic spine. There is mild right-sided foraminal narrowing at T10-11 and T11-12. No cord deformity.   L1-2: Mild disc bulging with anterior osteophytes. No significant spinal stenosis or foraminal narrowing.   L2-3: Preserved disc height with mild disc bulging and facet hypertrophy. No significant spinal stenosis or foraminal narrowing.   L3-4: Progressive loss of disc height with progressive annular disc bulging, facet and ligamentous hypertrophy. Resulting increased central canal stenosis, now moderate. There is mild to moderate left greater than right lateral recess and foraminal narrowing.   L4-5: Chronic spondylosis with interval interbody ankylosis. Advanced facet disease with probable interfacetal ankylosis. Resulting chronic moderate multifactorial spinal stenosis and moderate lateral recess and foraminal narrowing bilaterally are similar to the previous study.   L5-S1: Chronic loss of disc height with annular disc bulging and endplate osteophytes. Moderate bilateral facet hypertrophy. Mild spinal stenosis and mild lateral recess narrowing bilaterally. There is chronic moderate to severe foraminal narrowing bilaterally.   IMPRESSION: 1. Compared with previous MRI from 02/04/2019, there is  progressive multilevel thoracolumbar spondylosis superimposed on a congenitally small canal. Evidence for multilevel ankylosing osteophytes in the lower thoracic spine. 2. Progressive spondylosis at L3-4 with resulting moderate multifactorial spinal stenosis and mild to moderate left greater than right lateral recess and foraminal narrowing. 3. Chronic spondylosis at L4-5 with interval interbody and probable interfacetal ankylosis. Resulting chronic moderate multifactorial spinal stenosis and moderate lateral recess and foraminal narrowing bilaterally. 4. Chronic mild multifactorial spinal stenosis and moderate to severe foraminal narrowing bilaterally at L5-S1. 5. No acute findings or clear explanation for the patient's symptoms.     Electronically Signed   By: Carey Bullocks M.D.   On: 11/03/2022 14:51   She reports that she has never smoked. She has never used smokeless tobacco. No results for input(s): "HGBA1C", "LABURIC" in the last 8760 hours.  Objective:  VS:  HT:    WT:   BMI:     BP:   HR: bpm  TEMP: ( )  RESP:  Physical Exam Vitals and nursing note reviewed.  HENT:     Head: Normocephalic and atraumatic.     Right Ear: External ear normal.     Left Ear: External ear normal.     Nose: Nose normal.     Mouth/Throat:     Mouth: Mucous membranes are moist.  Eyes:     Extraocular Movements: Extraocular movements intact.  Cardiovascular:     Rate and Rhythm: Normal rate.     Pulses: Normal pulses.  Pulmonary:     Effort: Pulmonary effort is normal.  Abdominal:     General: Abdomen is flat. There is no distension.  Musculoskeletal:        General: Tenderness present.     Cervical back: Normal range of motion.     Comments: Patient is slow to rise from seated position to standing. Pain noted with facet loading and lumbar extension. 5/5 strength noted with bilateral hip flexion, knee flexion/extension, ankle dorsiflexion/plantarflexion and EHL. No clonus noted  bilaterally. No pain upon palpation of greater trochanters. No pain with internal/external rotation of bilateral hips. Sensation intact bilaterally. Negative slump test bilaterally. Ambulates with walker, gait slow and unsteady.   Skin:    General: Skin is warm and dry.     Capillary Refill: Capillary refill takes less than 2 seconds.  Neurological:     Mental Status: She is alert and oriented to person, place, and time.     Gait: Gait abnormal.  Psychiatric:        Mood and Affect: Mood normal.        Behavior: Behavior normal.     Ortho Exam  Imaging: No results found.  Past Medical/Family/Surgical/Social History: Medications & Allergies reviewed per EMR, new medications updated. Patient Active Problem List   Diagnosis Date Noted   OA (osteoarthritis) of knee 02/08/2022   Total knee replacement status, left 02/08/2022   Pain in left hand 06/29/2021   Cerebrovascular disease 05/11/2020   Gout 03/09/2020   Allergic rhinitis 01/25/2020   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   Colon cancer screening 01/25/2020   Gastroesophageal reflux disease without esophagitis 01/25/2020   Hyperlipidemia 01/25/2020   Morbid obesity (HCC) 01/25/2020   History of colonic polyps 01/25/2020   OSA (obstructive sleep apnea) 01/25/2020   Vitamin D deficiency 01/25/2020   Controlled type 2 diabetes mellitus without complication (HCC) 11/10/2019   Chronic kidney disease, stage 4 (severe) (HCC) 06/29/2019   Long term (current) use of insulin (HCC) 06/29/2019   Epidural lipomatosis 03/02/2019   Spondylolisthesis of lumbar region 03/02/2019   Spinal stenosis of lumbar region with neurogenic claudication 03/02/2019   Left wrist tendinitis 09/07/2018   Unilateral primary osteoarthritis, left knee 04/03/2018   Celiac disease 08/21/2016   Abnormal thyroid function test 04/01/2016   Pain in thumb joint with movement of right hand 09/14/2015   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   Moderate major  depression, single episode (HCC) 02/02/2010   Diabetic renal disease (HCC) 06/07/2009   Anemia 01/18/2009   Essential hypertension 01/18/2009   Past Medical History:  Diagnosis Date   Arthritis    Diabetes mellitus    ESRD on HD    T Th Sa   GERD (gastroesophageal reflux disease)    Hyperlipemia    Hypertension    Seasonal allergies    Sleep apnea    Family History  Problem Relation Age of Onset   BRCA 1/2 Neg Hx    Breast cancer Neg Hx    Past Surgical History:  Procedure Laterality Date   AV FISTULA PLACEMENT Left 04/10/2020   Procedure: LEFT ARM ARTERIOVENOUS (AV) FISTULA CREATION;  Surgeon: Sherren Kerns, MD;  Location: [facility] OR;  Service: Vascular;  Laterality: Left;   CAPSULOTOMY  01/13/2012   Procedure: MINOR CAPSULOTOMY;  Surgeon: Vita Erm., MD;  Location: [facility] OR;  Service: Ophthalmology;  Laterality: Left;   DORSAL COMPARTMENT RELEASE  01/22/2007   rt    FISTULA SUPERFICIALIZATION Left 05/29/2020   Procedure: LEFT UPPER ARM FISTULA SUPERFICIALIZATION WITH LIGATION OF MULTIPLE SIDE BRANCHES;  Surgeon: Sherren Kerns, MD;  Location: [facility] OR;  Service: Vascular;  Laterality: Left;  KNEE ARTHROSCOPY  02/21/2002   right and left   STERIOD INJECTION  08/01/2011   Procedure: STEROID INJECTION;  Surgeon: Wyn Forster., MD;  Location: [facility];  Service: Orthopedics;  Laterality: Left;  [facility] left thumb   TONSILLECTOMY     TOTAL KNEE ARTHROPLASTY Left 02/08/2022   Procedure: LEFT TOTAL KNEE ARTHROPLASTY;  Surgeon: Nadara Mustard, MD;  Location: [facility] OR;  Service: Orthopedics;  Laterality: Left;   TRIGGER FINGER RELEASE  01/22/2004   rt thumb   TRIGGER FINGER RELEASE  08/01/2011   Procedure: RELEASE TRIGGER FINGER/A-1 PULLEY;  Surgeon: Wyn Forster., MD;  Location: [facility];  Service: Orthopedics;  Laterality: Left;  left thumb   YAG LASER APPLICATION  01/13/2012   Procedure: YAG LASER APPLICATION;  Surgeon: Vita Erm., MD;  Location: [facility] OR;  Service: Ophthalmology;  Laterality: Left;   Social History   Occupational History   Not on file  Tobacco Use   Smoking status: Never   Smokeless tobacco: Never  Vaping Use   Vaping status: Never Used  Substance and Sexual Activity   Alcohol use: No   Drug use: No   Sexual activity: Not on file

## 2023-04-25 NOTE — Telephone Encounter (Signed)
 Pt returning a phone call, best call back [phone]

## 2023-04-25 NOTE — Progress Notes (Signed)
 Pain Scale   Average Pain 8 Patient advised she completed her PT and that it did not help her.        +Driver, -BT, -Dye Allergies.

## 2023-04-30 ENCOUNTER — Encounter: Admitting: Physical Therapy

## 2023-05-07 ENCOUNTER — Other Ambulatory Visit: Payer: Self-pay

## 2023-05-07 ENCOUNTER — Ambulatory Visit (INDEPENDENT_AMBULATORY_CARE_PROVIDER_SITE_OTHER): Admitting: Physical Medicine and Rehabilitation

## 2023-05-07 VITALS — BP 153/64 | HR 76

## 2023-05-07 DIAGNOSIS — M47816 Spondylosis without myelopathy or radiculopathy, lumbar region: Secondary | ICD-10-CM | POA: Diagnosis not present

## 2023-05-07 NOTE — Patient Instructions (Signed)

## 2023-05-07 NOTE — Progress Notes (Unsigned)
 Pain Scale   Average Pain 8 Patient advising her pain is constant and it wakes her up at night        +Driver, -BT, -Dye Allergies.

## 2023-05-08 NOTE — Progress Notes (Signed)
 Chelsey Lee - 67 y.o. female MRN [id number]  Date of birth: 1955-04-23  Office Visit Note: Visit Date: 05/07/2023 PCP: Andi Devon, MD Referred by: Andi Devon, MD  Subjective: Chief Complaint  Patient presents with   Lower Back - Pain   HPI:  Chelsey Lee is a 68 y.o. female who comes in today at the request of Chelsey Noga, FNP for planned Left  L4-5 and L5-S1 Lumbar facet/medial branch block with fluoroscopic guidance.  The patient has failed conservative care including home exercise, medications, time and activity modification.  This injection will be diagnostic and hopefully therapeutic.  Please see requesting physician notes for further details and justification.  Exam has shown concordant pain with facet joint loading.   ROS Otherwise per HPI.  Assessment & Plan: Visit Diagnoses:    ICD-10-CM   1. Spondylosis without myelopathy or radiculopathy, lumbar region  M47.816 XR C-ARM NO REPORT    Facet Injection      Plan: No additional findings.   Meds & Orders: No orders of the defined types were placed in this encounter.   Orders Placed This Encounter  Procedures   Facet Injection   XR C-ARM NO REPORT    Follow-up: Return if symptoms worsen or fail to improve.   Procedures: No procedures performed  Lumbar Facet Joint Intra-Articular Injection(s) with Fluoroscopic Guidance  Patient: Chelsey Lee      Date of Birth: 01-21-1956 MRN: [id number] PCP: Andi Devon, MD      Visit Date: 05/07/2023   Universal Protocol:    Date/Time: 05/07/2023  Consent Given By: the patient  Position: PRONE   Additional Comments: Vital signs were monitored before and after the procedure. Patient was prepped and draped in the usual sterile fashion. The correct patient, procedure, and site was verified.   Injection Procedure Details:  Procedure Site One Meds Administered: 40mg  Depo-Medrol   Laterality: Left  Location/Site:   L4-L5 L5-S1  Needle size: 22 guage  Needle type: Spinal  Needle Placement: Articular  Findings:  -Comments: Excellent flow of contrast producing a partial arthrogram.  Procedure Details: The fluoroscope beam is vertically oriented in AP, and the inferior recess is visualized beneath the lower pole of the inferior apophyseal process, which represents the target point for needle insertion. When direct visualization is difficult the target point is located at the medial projection of the vertebral pedicle. The region overlying each aforementioned target is locally anesthetized with a 1 to 2 ml. volume of 1% Lidocaine without Epinephrine.   The spinal needle was inserted into each of the above mentioned facet joints using biplanar fluoroscopic guidance. A 0.25 to 0.5 ml. volume of Isovue-250 was injected and a partial facet joint arthrogram was obtained. A single spot film was obtained of the resulting arthrogram.    One to 1.25 ml of the steroid/anesthetic solution was then injected into each of the facet joints noted above.   Additional Comments:  The patient tolerated the procedure well Dressing: 2 x 2 sterile gauze and Band-Aid    Post-procedure details: Patient was observed during the procedure. Post-procedure instructions were reviewed.  Patient left the clinic in stable condition.    Clinical History: CLINICAL DATA:  Low back pain radiating into the left hip since 2022. No acute injury or prior relevant surgery. Previous injection.   EXAM: MRI LUMBAR SPINE WITHOUT CONTRAST   TECHNIQUE: Multiplanar, multisequence MR imaging of the lumbar spine was performed. No intravenous contrast was administered.   COMPARISON:  Lumbar MRI 02/04/2019   FINDINGS: Segmentation: Conventional anatomy assumed, with the last open disc space designated L5-S1.Concurrent with prior imaging.   Alignment:  Stable chronic grade 1 anterolisthesis at L4-5.   Vertebrae: No worrisome osseous  lesion, acute fracture or pars defect. There are multilevel endplate degenerative changes in the thoracolumbar spine with evidence of interval interbody ankylosis at L4-5. Interval chronic appearing Schmorl's node in the superior endplate of L4. Ankylosing lower thoracic paraspinal osteophytes. Underlying congenitally short lumbar pedicles.   Conus medullaris: Extends to the L1 level and appears normal.   Paraspinal and other soft tissues: No significant paraspinal findings.   Disc levels:   Sagittal images demonstrate progressive multilevel spondylosis with ankylosing paraspinal osteophytes in the lower thoracic spine. There is mild right-sided foraminal narrowing at T10-11 and T11-12. No cord deformity.   L1-2: Mild disc bulging with anterior osteophytes. No significant spinal stenosis or foraminal narrowing.   L2-3: Preserved disc height with mild disc bulging and facet hypertrophy. No significant spinal stenosis or foraminal narrowing.   L3-4: Progressive loss of disc height with progressive annular disc bulging, facet and ligamentous hypertrophy. Resulting increased central canal stenosis, now moderate. There is mild to moderate left greater than right lateral recess and foraminal narrowing.   L4-5: Chronic spondylosis with interval interbody ankylosis. Advanced facet disease with probable interfacetal ankylosis. Resulting chronic moderate multifactorial spinal stenosis and moderate lateral recess and foraminal narrowing bilaterally are similar to the previous study.   L5-S1: Chronic loss of disc height with annular disc bulging and endplate osteophytes. Moderate bilateral facet hypertrophy. Mild spinal stenosis and mild lateral recess narrowing bilaterally. There is chronic moderate to severe foraminal narrowing bilaterally.   IMPRESSION: 1. Compared with previous MRI from 02/04/2019, there is progressive multilevel thoracolumbar spondylosis superimposed on a  congenitally small canal. Evidence for multilevel ankylosing osteophytes in the lower thoracic spine. 2. Progressive spondylosis at L3-4 with resulting moderate multifactorial spinal stenosis and mild to moderate left greater than right lateral recess and foraminal narrowing. 3. Chronic spondylosis at L4-5 with interval interbody and probable interfacetal ankylosis. Resulting chronic moderate multifactorial spinal stenosis and moderate lateral recess and foraminal narrowing bilaterally. 4. Chronic mild multifactorial spinal stenosis and moderate to severe foraminal narrowing bilaterally at L5-S1. 5. No acute findings or clear explanation for the patient's symptoms.     Electronically Signed   By: Carey Bullocks M.D.   On: 11/03/2022 14:51     Objective:  VS:  HT:    WT:   BMI:     BP:(!) 153/64  HR:76bpm  TEMP: ( )  RESP:  Physical Exam Vitals and nursing note reviewed.  Constitutional:      General: She is not in acute distress.    Appearance: Normal appearance. She is obese. She is not ill-appearing.  HENT:     Head: Normocephalic and atraumatic.     Right Ear: External ear normal.     Left Ear: External ear normal.  Eyes:     Extraocular Movements: Extraocular movements intact.  Cardiovascular:     Rate and Rhythm: Normal rate.     Pulses: Normal pulses.  Pulmonary:     Effort: Pulmonary effort is normal. No respiratory distress.  Abdominal:     General: There is no distension.     Palpations: Abdomen is soft.  Musculoskeletal:        General: Tenderness present.     Cervical back: Neck supple.     Right lower leg: No edema.  Left lower leg: No edema.     Comments: Patient has good distal strength with no pain over the greater trochanters.  No clonus or focal weakness.  Skin:    Findings: No erythema, lesion or rash.  Neurological:     General: No focal deficit present.     Mental Status: She is alert and oriented to person, place, and time.      Cranial Nerves: No cranial nerve deficit.     Sensory: No sensory deficit.     Motor: No weakness or abnormal muscle tone.     Coordination: Coordination normal.     Gait: Gait abnormal.  Psychiatric:        Mood and Affect: Mood normal.        Behavior: Behavior normal.      Imaging: XR C-ARM NO REPORT Result Date: 05/07/2023 Please see Notes tab for imaging impression.

## 2023-05-08 NOTE — Procedures (Signed)
 Lumbar Facet Joint Intra-Articular Injection(s) with Fluoroscopic Guidance  Patient: Chelsey Lee      Date of Birth: [birth year] MRN: [id number] PCP: Yolanda Hence, MD      Visit Date: 05/07/2023   Universal Protocol:    Date/Time: 05/07/2023  Consent Given By: the patient  Position: PRONE   Additional Comments: Vital signs were monitored before and after the procedure. Patient was prepped and draped in the usual sterile fashion. The correct patient, procedure, and site was verified.   Injection Procedure Details:  Procedure Site One Meds Administered: 40mg  Depo-Medrol   Laterality: Left  Location/Site:  L4-L5 L5-S1  Needle size: 22 guage  Needle type: Spinal  Needle Placement: Articular  Findings:  -Comments: Excellent flow of contrast producing a partial arthrogram.  Procedure Details: The fluoroscope beam is vertically oriented in AP, and the inferior recess is visualized beneath the lower pole of the inferior apophyseal process, which represents the target point for needle insertion. When direct visualization is difficult the target point is located at the medial projection of the vertebral pedicle. The region overlying each aforementioned target is locally anesthetized with a 1 to 2 ml. volume of 1% Lidocaine without Epinephrine.   The spinal needle was inserted into each of the above mentioned facet joints using biplanar fluoroscopic guidance. A 0.25 to 0.5 ml. volume of Isovue-250 was injected and a partial facet joint arthrogram was obtained. A single spot film was obtained of the resulting arthrogram.    One to 1.25 ml of the steroid/anesthetic solution was then injected into each of the facet joints noted above.   Additional Comments:  The patient tolerated the procedure well Dressing: 2 x 2 sterile gauze and Band-Aid    Post-procedure details: Patient was observed during the procedure. Post-procedure instructions were reviewed.  Patient left the  clinic in stable condition.

## 2023-05-23 ENCOUNTER — Telehealth: Payer: Self-pay | Admitting: Physical Medicine and Rehabilitation

## 2023-05-23 NOTE — Telephone Encounter (Signed)
 Patient called. Says she needs some pain medication. Shots did not work.

## 2023-05-26 ENCOUNTER — Telehealth: Payer: Self-pay | Admitting: Physical Medicine and Rehabilitation

## 2023-05-26 NOTE — Telephone Encounter (Signed)
 Patient returning a call to [facility]

## 2023-05-28 ENCOUNTER — Other Ambulatory Visit: Payer: Self-pay | Admitting: Physical Medicine and Rehabilitation

## 2023-05-28 ENCOUNTER — Telehealth: Payer: Self-pay | Admitting: Physical Medicine and Rehabilitation

## 2023-05-28 MED ORDER — TRAMADOL HCL 50 MG PO TABS: 50.0000 mg | ORAL_TABLET | Freq: Three times a day (TID) | ORAL | 0 refills | Status: DC | PRN

## 2023-05-28 NOTE — Telephone Encounter (Signed)
 Pt called requesting a call back from [place] Concerning her back. Please call pt at  [phone].

## 2023-06-25 ENCOUNTER — Other Ambulatory Visit: Payer: Self-pay | Admitting: Physical Medicine and Rehabilitation

## 2023-06-25 ENCOUNTER — Ambulatory Visit: Admitting: Orthopedic Surgery

## 2023-06-25 ENCOUNTER — Telehealth: Payer: Self-pay | Admitting: Physical Medicine and Rehabilitation

## 2023-06-25 MED ORDER — TRAMADOL HCL 50 MG PO TABS: 50.0000 mg | ORAL_TABLET | Freq: Three times a day (TID) | ORAL | 0 refills | Status: DC | PRN

## 2023-06-25 NOTE — Telephone Encounter (Signed)
 Pt's mom called requesting pain medication. Pt was late to her appt and had to reschedule. Pt's mom states she is in severe pains and next appt is not until 6/18. Please call pt's mom at [phone].

## 2023-07-02 ENCOUNTER — Ambulatory Visit: Admitting: Podiatry

## 2023-07-09 ENCOUNTER — Encounter: Payer: Self-pay | Admitting: Physical Medicine and Rehabilitation

## 2023-07-09 ENCOUNTER — Ambulatory Visit (INDEPENDENT_AMBULATORY_CARE_PROVIDER_SITE_OTHER): Admitting: Physical Medicine and Rehabilitation

## 2023-07-09 DIAGNOSIS — M47816 Spondylosis without myelopathy or radiculopathy, lumbar region: Secondary | ICD-10-CM

## 2023-07-09 DIAGNOSIS — M5442 Lumbago with sciatica, left side: Secondary | ICD-10-CM | POA: Diagnosis not present

## 2023-07-09 DIAGNOSIS — M5416 Radiculopathy, lumbar region: Secondary | ICD-10-CM | POA: Diagnosis not present

## 2023-07-09 DIAGNOSIS — M48062 Spinal stenosis, lumbar region with neurogenic claudication: Secondary | ICD-10-CM

## 2023-07-09 DIAGNOSIS — G8929 Other chronic pain: Secondary | ICD-10-CM

## 2023-07-09 NOTE — Progress Notes (Unsigned)
 Pain Scale   Average Pain 9 Patient advising she has Chronic lower back pain without any relief. Patient advising she wants a referral to Dr. Sulema Endo         +Driver, -BT, -Dye Allergies.

## 2023-07-09 NOTE — Progress Notes (Signed)
 Chelsey Lee - 68 y.o. female MRN [id number]  Date of birth: 02/11/1955  Office Visit Note: Visit Date: 07/09/2023 PCP: Yolanda Hence, MD Referred by: Yolanda Hence, MD  Subjective: Chief Complaint  Chelsey Lee presents with   Lower Back - Pain   HPI: Chelsey Lee is a 68 y.o. female who comes in today for evaluation of chronic, worsening and severe left lower back pain radiating to buttock, groin and anterior thigh. Pain ongoing for several years, worsens with bending and standing to perform household tasks such as washing dishes. She describes pain as numbness and cramping, currently rates as 9 out of 10. Some relief of pain with home exercise regimen, rest and use of medications. Recent short course of formal physical therapy did not provide relief of pain. Does take Norco that is prescribed by her primary care provider.  Lumbar MRI imaging from October of 2024 exhibits moderate multi level spinal canal stenosis at L3-L4 and L4-L5, moderate to severe bilateral foraminal stenosis at L5-S1. There is advanced facet disease at L4-L5 and moderate facet arthropathy at L5-S1. She has undergone multiple lumbar epidural steroid injections in our office with minimal relief of pain. More recently, she underwent left L4-L5 and L5-S1 facet joint injections with no relief of pain. She was scheduled to see Dr. Colette Davies on 06/25/2023, however was unable to make it to this appointment. She is here today requesting appointment with Dr. Sulema Endo. Chelsey Lee denies focal weakness. No recent trauma or falls. She is currently using cane to assist with ambulation.   Patients course is complicated by chronic kidney disease requiring hemodialysis, diabetes mellitus, hypertension and morbid obesity.        Review of Systems  Musculoskeletal:  Positive for back pain.  Neurological:  Positive for tingling. Negative for focal weakness and weakness.  All other systems reviewed and are negative.  Otherwise  per HPI.  Assessment & Plan: Visit Diagnoses:    ICD-10-CM   1. Chronic left-sided low back pain with left-sided sciatica  M54.42 Ambulatory referral to Orthopedic Surgery   G89.29     2. Lumbar radiculopathy  M54.16 Ambulatory referral to Orthopedic Surgery    3. Spinal stenosis of lumbar region with neurogenic claudication  M48.062 Ambulatory referral to Orthopedic Surgery    4. Facet arthropathy, lumbar  M47.816 Ambulatory referral to Orthopedic Surgery       Plan: Findings:  Chronic, worsening and severe left lower back pain radiating to buttock, groin and anterior thigh. Chelsey Lee continues to have severe pain despite good conservative therapies such as formal physical therapy, home exercise regimen, rest and use of medications. Patients clinical presentation and exam are consistent with lumbar radiculopathy. There is moderate spinal canal stenosis at L3-L4 and L4-L5. No pain with internal/external rotation of left hip. Of note, she did undergo left intra-articular hip injection in 2022, no relief of pain with this procedure. No relief of pain with multiple lumbar epidural steroid injections and lumbar facet joint injections. She continues to take Norco daily. At this point, would not recommend continued interventional spine procedures. She is requesting surgical evaluation with Dr. Sulema Endo, I did go ahead and place referral for her today. I also think she would benefit from chronic pain management, she would like to hold on this referral today. She has no questions at this time. No red flag symptoms noted upon exam today.     Meds & Orders: No orders of the defined types were placed in this encounter.   Orders  Placed This Encounter  Procedures   Ambulatory referral to Orthopedic Surgery    Follow-up: Return if symptoms worsen or fail to improve.   Procedures: No procedures performed      Clinical History: CLINICAL DATA:  Low back pain radiating into the left hip since 2022. No  acute injury or prior relevant surgery. Previous injection.   EXAM: MRI LUMBAR SPINE WITHOUT CONTRAST   TECHNIQUE: Multiplanar, multisequence MR imaging of the lumbar spine was performed. No intravenous contrast was administered.   COMPARISON:  Lumbar MRI 02/04/2019   FINDINGS: Segmentation: Conventional anatomy assumed, with the last open disc space designated L5-S1.Concurrent with prior imaging.   Alignment:  Stable chronic grade 1 anterolisthesis at L4-5.   Vertebrae: No worrisome osseous lesion, acute fracture or pars defect. There are multilevel endplate degenerative changes in the thoracolumbar spine with evidence of interval interbody ankylosis at L4-5. Interval chronic appearing Schmorl's node in the superior endplate of L4. Ankylosing lower thoracic paraspinal osteophytes. Underlying congenitally short lumbar pedicles.   Conus medullaris: Extends to the L1 level and appears normal.   Paraspinal and other soft tissues: No significant paraspinal findings.   Disc levels:   Sagittal images demonstrate progressive multilevel spondylosis with ankylosing paraspinal osteophytes in the lower thoracic spine. There is mild right-sided foraminal narrowing at T10-11 and T11-12. No cord deformity.   L1-2: Mild disc bulging with anterior osteophytes. No significant spinal stenosis or foraminal narrowing.   L2-3: Preserved disc height with mild disc bulging and facet hypertrophy. No significant spinal stenosis or foraminal narrowing.   L3-4: Progressive loss of disc height with progressive annular disc bulging, facet and ligamentous hypertrophy. Resulting increased central canal stenosis, now moderate. There is mild to moderate left greater than right lateral recess and foraminal narrowing.   L4-5: Chronic spondylosis with interval interbody ankylosis. Advanced facet disease with probable interfacetal ankylosis. Resulting chronic moderate multifactorial spinal stenosis  and moderate lateral recess and foraminal narrowing bilaterally are similar to the previous study.   L5-S1: Chronic loss of disc height with annular disc bulging and endplate osteophytes. Moderate bilateral facet hypertrophy. Mild spinal stenosis and mild lateral recess narrowing bilaterally. There is chronic moderate to severe foraminal narrowing bilaterally.   IMPRESSION: 1. 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 Evidence for multilevel ankylosing osteophytes in the lower thoracic spine. 2. Progressive spondylosis at L3-4 with resulting moderate multifactorial spinal stenosis and mild to moderate left greater than right lateral recess and foraminal narrowing. 3. Chronic spondylosis at L4-5 with interval interbody and probable interfacetal ankylosis. Resulting chronic moderate multifactorial spinal stenosis and moderate lateral recess and foraminal narrowing bilaterally. 4. Chronic mild multifactorial spinal stenosis and moderate to severe foraminal narrowing bilaterally at L5-S1. 5. No acute findings or clear explanation for the Chelsey Lee's symptoms.     Electronically Signed   By: Elmon Hagedorn M.D.   On: 11/03/2022 14:51   She reports that she has never smoked. She has never used smokeless tobacco. No results for input(s): HGBA1C, LABURIC in the last 8760 hours.  Objective:  VS:  HT:    WT:   BMI:     BP:   HR: bpm  TEMP: ( )  RESP:  Physical Exam Vitals and nursing note reviewed.  HENT:     Head: Normocephalic and atraumatic.     Right Ear: External ear normal.     Left Ear: External ear normal.     Nose: Nose  normal.     Mouth/Throat:     Mouth: Mucous membranes are moist.   Eyes:     Extraocular Movements: Extraocular movements intact.    Cardiovascular:     Rate and Rhythm: Normal rate.     Pulses: Normal pulses.  Pulmonary:     Effort:  Pulmonary effort is normal.  Abdominal:     General: Abdomen is flat. There is no distension.   Musculoskeletal:        General: Tenderness present.     Cervical back: Normal range of motion.     Comments: Chelsey Lee is slow to rise from seated position to standing. Pain noted with lumbar extension. 5/5 strength noted with bilateral hip flexion, knee flexion/extension, ankle dorsiflexion/plantarflexion and EHL. No clonus noted bilaterally. No pain upon palpation of greater trochanters. No pain with internal/external rotation of bilateral hips. Sensation intact bilaterally. Negative slump test bilaterally. Ambulates with walker, gait slow and unsteady.     Skin:    General: Skin is warm and dry.     Capillary Refill: Capillary refill takes less than 2 seconds.   Neurological:     Mental Status: She is alert and oriented to person, place, and time.   Psychiatric:        Mood and Affect: Mood normal.        Behavior: Behavior normal.     Ortho Exam  Imaging: No results found.  Past Medical/Family/Surgical/Social History: Medications & Allergies reviewed per EMR, new medications updated. Chelsey Lee Active Problem List   Diagnosis Date Noted   OA (osteoarthritis) of knee 02/08/2022   Total knee replacement status, left 02/08/2022   Pain in left hand 06/29/2021   Cerebrovascular disease 05/11/2020   Gout 03/09/2020   Allergic rhinitis 01/25/2020   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   Colon cancer screening 01/25/2020   Gastroesophageal reflux disease without esophagitis 01/25/2020   Hyperlipidemia 01/25/2020   Morbid obesity (HCC) 01/25/2020   History of colonic polyps 01/25/2020   OSA (obstructive sleep apnea) 01/25/2020   Vitamin D deficiency 01/25/2020   Controlled type 2 diabetes mellitus without complication (HCC) 11/10/2019   Chronic kidney disease, stage 4 (severe) (HCC) 06/29/2019   Long term (current) use of insulin  (HCC) 06/29/2019   Epidural lipomatosis  03/02/2019   Spondylolisthesis of lumbar region 03/02/2019   Spinal stenosis of lumbar region with neurogenic claudication 03/02/2019   Left wrist tendinitis 09/07/2018   Unilateral primary osteoarthritis, left knee 04/03/2018   Celiac disease 08/21/2016   Abnormal thyroid function test 04/01/2016   Pain in thumb joint with movement of right hand 09/14/2015   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   Moderate major depression, single episode (HCC) 02/02/2010   Diabetic renal disease (HCC) 06/07/2009   Anemia 01/18/2009   Essential hypertension 01/18/2009   Past Medical History:  Diagnosis Date   Arthritis    Diabetes mellitus    ESRD on HD    T Th Sa   GERD (gastroesophageal reflux disease)    Hyperlipemia    Hypertension    Seasonal allergies    Sleep apnea    Family History  Problem Relation Age of Onset   BRCA 1/2 Neg Hx    Breast cancer Neg Hx    Past Surgical History:  Procedure Laterality Date   AV FISTULA PLACEMENT Left 04/10/2020   Procedure: LEFT ARM ARTERIOVENOUS (AV) FISTULA CREATION;  Surgeon: Richrd Char, MD;  Location: [facility] OR;  Service: Vascular;  Laterality: Left;   CAPSULOTOMY  01/13/2012  Procedure: MINOR CAPSULOTOMY;  Surgeon: Russel Courser., MD;  Location: [facility] OR;  Service: Ophthalmology;  Laterality: Left;   DORSAL COMPARTMENT RELEASE  01/22/2007   rt    FISTULA SUPERFICIALIZATION Left 05/29/2020   Procedure: LEFT UPPER ARM FISTULA SUPERFICIALIZATION WITH LIGATION OF MULTIPLE SIDE BRANCHES;  Surgeon: Richrd Char, MD;  Location: [facility] OR;  Service: Vascular;  Laterality: Left;   KNEE ARTHROSCOPY  02/21/2002   right and left   STERIOD INJECTION  08/01/2011   Procedure: STEROID INJECTION;  Surgeon: Amelie Baize., MD;  Location: [facility];  Service: Orthopedics;  Laterality: Left;  cmc left thumb   TONSILLECTOMY     TOTAL KNEE ARTHROPLASTY Left 02/08/2022   Procedure: LEFT TOTAL KNEE ARTHROPLASTY;  Surgeon: Timothy Ford, MD;   Location: [facility] OR;  Service: Orthopedics;  Laterality: Left;   TRIGGER FINGER RELEASE  01/22/2004   rt thumb   TRIGGER FINGER RELEASE  08/01/2011   Procedure: RELEASE TRIGGER FINGER/A-1 PULLEY;  Surgeon: Amelie Baize., MD;  Location: [facility];  Service: Orthopedics;  Laterality: Left;  left thumb   YAG LASER APPLICATION  01/13/2012   Procedure: YAG LASER APPLICATION;  Surgeon: Russel Courser., MD;  Location[facility] OR;  Service: Ophthalmology;  Laterality: Left;   Social History   Occupational History   Not on file  Tobacco Use   Smoking status: Never   Smokeless tobacco: Never  Vaping Use   Vaping status: Never Used  Substance and Sexual Activity   Alcohol use: No   Drug use: No   Sexual activity: Not on file

## 2023-08-08 ENCOUNTER — Other Ambulatory Visit: Payer: Medicare Other

## 2023-08-13 ENCOUNTER — Ambulatory Visit (HOSPITAL_BASED_OUTPATIENT_CLINIC_OR_DEPARTMENT_OTHER)
Admission: RE | Admit: 2023-08-13 | Discharge: 2023-08-13 | Disposition: A | Discharge status: Home / Self Care | Source: Ambulatory Visit | Attending: Internal Medicine | Admitting: Internal Medicine

## 2023-08-13 DIAGNOSIS — E2839 Other primary ovarian failure: Secondary | ICD-10-CM | POA: Insufficient documentation

## 2023-08-14 ENCOUNTER — Other Ambulatory Visit (HOSPITAL_BASED_OUTPATIENT_CLINIC_OR_DEPARTMENT_OTHER): Payer: Self-pay | Admitting: Internal Medicine

## 2023-08-14 DIAGNOSIS — M25552 Pain in left hip: Secondary | ICD-10-CM

## 2023-08-22 ENCOUNTER — Ambulatory Visit (HOSPITAL_BASED_OUTPATIENT_CLINIC_OR_DEPARTMENT_OTHER)
Admission: RE | Admit: 2023-08-22 | Discharge: 2023-08-22 | Disposition: A | Discharge status: Home / Self Care | Source: Ambulatory Visit | Attending: Internal Medicine | Admitting: Internal Medicine

## 2023-08-22 DIAGNOSIS — M25552 Pain in left hip: Secondary | ICD-10-CM | POA: Diagnosis present

## 2023-08-22 NOTE — Procedures (Signed)
Mask fit

## 2023-08-28 ENCOUNTER — Encounter: Admitting: Orthopedic Surgery

## 2023-09-01 ENCOUNTER — Ambulatory Visit: Admitting: Orthopedic Surgery

## 2023-09-01 ENCOUNTER — Other Ambulatory Visit (INDEPENDENT_AMBULATORY_CARE_PROVIDER_SITE_OTHER): Payer: Self-pay

## 2023-09-01 DIAGNOSIS — M545 Low back pain, unspecified: Secondary | ICD-10-CM

## 2023-09-01 DIAGNOSIS — G8929 Other chronic pain: Secondary | ICD-10-CM

## 2023-09-01 DIAGNOSIS — M810 Age-related osteoporosis without current pathological fracture: Secondary | ICD-10-CM | POA: Diagnosis not present

## 2023-09-01 NOTE — Progress Notes (Signed)
 Orthopedic Spine Surgery Office Note  Assessment: Patient is a 68 y.o. female with low back pain that radiates into bilateral hips and anterior thighs.  Her pain is worse with standing or walking and improves with flexion lumbar spine consistent with neurogenic claudication.  She has central stenosis at L3/4 and L4/5   Plan: - Patient has tried multiple conservative treatments without any relief, so discussed her remaining options as pain management and surgery.  Went over some of the pros and cons of each of those.  She said her mom had back surgery and required further surgery, so she was not interested in any kind of spine surgery.  Accordingly, provided her with a referral to pain management -She does have osteoporosis on most recent DEXA scan, so referred her to our osteoporosis clinic -If she changes her mind, would need an A1c of 7.5 or less and a BMI of under 40 prior to any elective spine surgery -Patient should return to my office on an as-needed basis   Patient expressed understanding of the plan and all questions were answered to the patient's satisfaction.   ___________________________________________________________________________   History:  Patient is a 68 y.o. female who presents today for lumbar spine.  Patient has had over a year of low back pain that radiates into the bilateral lateral hips and anterior thighs.  She also sometimes feels it along the lateral thighs.  She notes the pain is present when standing or walking for more than 2 to 3 minutes.  It gets better if she sits down.  She describes the pain as burning.  It has gotten progressively worse with time.  She did notice some relief with injections particularly the first 2.  By the third injection, she was not getting any relief with that treatment.   Weakness: Denies Symptoms of imbalance: Denies Paresthesias and numbness: Denies Bowel or bladder incontinence: Denies Saddle anesthesia: Denies  Treatments  tried: PT, Tylenol , Aleve, oral steroids, lumbar steroid injections, tramadol , Percocet  Review of systems: Denies fevers and chills, night sweats, unexplained weight loss, history of cancer, pain that wakes her at night  Past medical history: HTN HLD DM ESRD OSA  Allergies: NKDA  Past surgical history:  Bilateral knee arthroscopy AV fistula placement Fistula superficialization Left TKA Trigger finger release  Social history: Denies use of nicotine product (smoking, vaping, patches, smokeless) Alcohol use: Denies Denies recreational drug use   Physical Exam:  BMI of 42.4  General: no acute distress, appears stated age Neurologic: alert, answering questions appropriately, following commands Respiratory: unlabored breathing on room air, symmetric chest rise Psychiatric: appropriate affect, normal cadence to speech   MSK (spine):  -Strength exam      Left  Right EHL    5/5  5/5 TA    5/5  5/5 GSC    5/5  5/5 Knee extension  5/5  5/5 Hip flexion   5/5  5/5  -Sensory exam    Sensation intact to light touch in L3-S1 nerve distributions of bilateral lower extremities  Imaging: XRs of the lumbar spine from 09/01/2023 were independently reviewed and interpreted, showing disc height loss at L3/4 and L4/5.  Anterior osteophyte formation seen at L5/S1.  No other significant degenerative changes seen.  No evidence of instability on flexion/extension views.  No fracture or dislocation seen.  MRI of the lumbar spine from 10/13/2022 was independently reviewed and interpreted, showing DDD with possible autofusion at L4/5.  DDD at L5/S1.  Bilateral foraminal stenosis at L4/5 and  L5/S1.  Central lateral recess stenosis at L3/4.  Central stenosis at L4/5.   Patient name: Chelsey Lee Patient MRN: [id number] Date of visit: 09/01/23

## 2023-09-08 ENCOUNTER — Ambulatory Visit (INDEPENDENT_AMBULATORY_CARE_PROVIDER_SITE_OTHER): Admitting: Podiatry

## 2023-09-08 ENCOUNTER — Encounter: Payer: Self-pay | Admitting: Podiatry

## 2023-09-08 DIAGNOSIS — B351 Tinea unguium: Secondary | ICD-10-CM | POA: Diagnosis not present

## 2023-09-08 DIAGNOSIS — N184 Chronic kidney disease, stage 4 (severe): Secondary | ICD-10-CM

## 2023-09-08 DIAGNOSIS — M79674 Pain in right toe(s): Secondary | ICD-10-CM | POA: Diagnosis not present

## 2023-09-08 DIAGNOSIS — M79675 Pain in left toe(s): Secondary | ICD-10-CM

## 2023-09-08 DIAGNOSIS — E119 Type 2 diabetes mellitus without complications: Secondary | ICD-10-CM

## 2023-09-08 NOTE — Progress Notes (Signed)

## 2023-09-10 ENCOUNTER — Telehealth: Payer: Self-pay | Admitting: Internal Medicine

## 2023-09-10 NOTE — Telephone Encounter (Signed)
 Walterine Shivers, was going to try to call this patient.  What is the reason the patient was calling?  Thank you!

## 2023-10-11 NOTE — Progress Notes (Unsigned)
 Cardiology Office Note:   Date:  10/17/2023  ID:  Chelsey Lee, Chelsey Lee 16-Jun-1955, MRN [id number] PCP:  Theo Iha, MD  [facility] Providers Cardiologist:  Wendel Haws, MD Referring MD: Theo Iha, MD  Chief Complaint/Reason for Referral:  HTN ASSESSMENT:    1. Type 2 diabetes mellitus with complication, with long-term current use of insulin  (HCC)   2. Hypertension associated with diabetes (HCC)   3. Hyperlipidemia associated with type 2 diabetes mellitus (HCC)   4. End stage renal disease (HCC)   5. BMI 39.0-39.9,adult   6. Murmur     PLAN:   In order of problems listed above: T2DM: Start aspirin  81 mg, continue atorvastatin  20 mg, defer ARB and SGLT2 inhibitor given end-stage renal disease Hypertension: Continue Coreg  25 twice daily, Imdur  30.  Blood pressure is well-controlled today.  Obtain echocardiogram Hyperlipidemia: Continue atorvastatin  20. Check lipid panel, LFTs, LP(a) today End-stage renal disease: Managed per other providers. Elevated BMI: Continue semaglutide  0.25 mg q. Weekly Murmur:  Obtain TTE.            Dispo:  Return in about 6 months (around 04/15/2024).       I spent 38 minutes reviewing all clinical data during and prior to this visit including all relevant imaging studies, laboratories, clinical information from other health systems and prior notes from both Cardiology and other specialties, interviewing the patient, conducting a complete physical examination, and coordinating care in order to formulate a comprehensive and personalized evaluation and treatment plan.   History of Present Illness:    FOCUSED PROBLEM LIST:   T2DM On insulin  Hypertension Hyperlipidemia 1 AVB ESRD On HD OSA on CPAP BMI 39 Chronic back pain  September 2025:  Patient consents to use of AI scribe. Patient is a 68 year old female with above listed medical problems referred for recommendations regarding hypertension.  She has been on dialysis  for approximately ten years, with a recent interruption last month due to cramping in her knee. Otherwise, her dialysis sessions have been uneventful. She continues to produce a small amount of urine in the morning.  Her diabetes is managed with insulin  therapy, and her hypertension is well-controlled with medication. She is also on a cholesterol-lowering medication, with her LDL last recorded at 90 mg/dL in May.  She is under pain management for back pain and has a history of knee surgery in 2004. She anticipates needing surgery on the other knee next year.  No chest discomfort, breathing difficulties, leg swelling, or palpitations. Her breathing is reported as 'going pretty good.'      Current Medications: Current Meds  Medication Sig   allopurinol  (ZYLOPRIM ) 100 MG tablet Take 1 tablet (100 mg total) by mouth daily.   ammonium lactate  (AMLACTIN) 12 % lotion Apply 1 Application topically as needed for dry skin.   atorvastatin  (LIPITOR) 20 MG tablet Take 20 mg by mouth daily.   AURYXIA  1 GM 210 MG(Fe) tablet Take 210-420 mg by mouth See admin instructions. 420 mg three times daily with meals, and 210 mg twice daily with snacks   B Complex-C-Folic Acid  (DIALYVITE TABLET) TABS Take 1 tablet by mouth daily.   buPROPion  (WELLBUTRIN  SR) 150 MG 12 hr tablet Take 150 mg by mouth 2 (two) times daily.   carvedilol  (COREG ) 25 MG tablet Take 25 mg by mouth 2 (two) times daily with a meal.   CHELATED MAGNESIUM  PO Take 2 tablets by mouth every evening.   Continuous Blood Gluc Sensor (FREESTYLE LIBRE 2 SENSOR) MISC  escitalopram  (LEXAPRO ) 10 MG tablet Take 10 mg by mouth daily.   FREESTYLE LITE test strip SMARTSIG:Via Meter 2-3 Times Daily   glucose blood (ONETOUCH ULTRA) test strip    insulin  NPH-regular Human (70-30) 100 UNIT/ML injection Inject 40-50 Units into the skin See admin instructions. Inject 50 units into the skin in the morning and 40 units at night   isosorbide  mononitrate (IMDUR ) 30 MG  24 hr tablet Take 30 mg by mouth daily.   LINZESS  145 MCG CAPS capsule Take 145 mcg by mouth daily.   predniSONE  (DELTASONE ) 50 MG tablet Take 1 tablet (50 mg total) by mouth daily with breakfast. Take until completed.     Review of Systems:   Please see the history of present illness.    All other systems reviewed and are negative.     EKGs/Labs/Other Test Reviewed:   EKG: 2025 sinus rhythm first-degree AV block  EKG Interpretation Date/Time:    Ventricular Rate:    PR Interval:    QRS Duration:    QT Interval:    QTC Calculation:   R Axis:      Text Interpretation:          CARDIAC STUDIES: Refer to CV Procedures and Imaging Tabs   Risk Assessment/Calculations:          Physical Exam:   VS:  BP 114/61 (BP Location: Right Arm, Patient Position: Sitting, Cuff Size: Normal)   Pulse 67   Resp 16   Ht 5' 6 (1.676 m)   Wt 273 lb 9.6 oz (124.1 kg)   SpO2 95%   BMI 44.16 kg/m        Wt Readings from Last 3 Encounters:  10/17/23 273 lb 9.6 oz (124.1 kg)  03/21/23 263 lb (119.3 kg)  02/09/23 260 lb (117.9 kg)      GENERAL:  No apparent distress, AOx3 HEENT:  No carotid bruits, +2 carotid impulses, no scleral icterus CAR: RRR 2 out of 6 systolic ejection murmur without gallops, rubs, or thrills RES:  Clear to auscultation bilaterally ABD:  Soft, nontender, nondistended, positive bowel sounds x 4 VASC:  +2 radial pulses, +2 carotid pulses NEURO:  CN 2-12 grossly intact; motor and sensory grossly intact PSYCH:  No active depression or anxiety EXT:  No edema, ecchymosis, or cyanosis  Signed, Octivia Canion K Jacee Enerson, MD  10/17/2023 2:26 PM    [facility] [address] [place], [place], [place]  [address] Phone: [phone]; Fax: [phone]   Note:  This document was prepared using Dragon voice recognition software and may include unintentional dictation errors.

## 2023-10-15 ENCOUNTER — Other Ambulatory Visit: Payer: Self-pay | Admitting: Internal Medicine

## 2023-10-15 DIAGNOSIS — Z1231 Encounter for screening mammogram for malignant neoplasm of breast: Secondary | ICD-10-CM

## 2023-10-17 ENCOUNTER — Ambulatory Visit: Attending: Internal Medicine | Admitting: Internal Medicine

## 2023-10-17 ENCOUNTER — Encounter: Payer: Self-pay | Admitting: Internal Medicine

## 2023-10-17 VITALS — BP 114/61 | HR 67 | Resp 16 | Ht 66.0 in | Wt 273.6 lb

## 2023-10-17 DIAGNOSIS — N186 End stage renal disease: Secondary | ICD-10-CM | POA: Insufficient documentation

## 2023-10-17 DIAGNOSIS — E118 Type 2 diabetes mellitus with unspecified complications: Secondary | ICD-10-CM | POA: Diagnosis present

## 2023-10-17 DIAGNOSIS — E1169 Type 2 diabetes mellitus with other specified complication: Secondary | ICD-10-CM | POA: Diagnosis present

## 2023-10-17 DIAGNOSIS — Z6839 Body mass index (BMI) 39.0-39.9, adult: Secondary | ICD-10-CM | POA: Diagnosis present

## 2023-10-17 DIAGNOSIS — Z794 Long term (current) use of insulin: Secondary | ICD-10-CM | POA: Diagnosis present

## 2023-10-17 DIAGNOSIS — E1159 Type 2 diabetes mellitus with other circulatory complications: Secondary | ICD-10-CM | POA: Diagnosis not present

## 2023-10-17 DIAGNOSIS — R011 Cardiac murmur, unspecified: Secondary | ICD-10-CM | POA: Insufficient documentation

## 2023-10-17 DIAGNOSIS — E785 Hyperlipidemia, unspecified: Secondary | ICD-10-CM | POA: Insufficient documentation

## 2023-10-17 DIAGNOSIS — I152 Hypertension secondary to endocrine disorders: Secondary | ICD-10-CM | POA: Diagnosis present

## 2023-10-17 NOTE — Patient Instructions (Signed)
 Medication Instructions:  Your physician recommends that you continue on your current medications as directed. Please refer to the Current Medication list given to you today.  *If you need a refill on your cardiac medications before your next appointment, please call your pharmacy*  Lab Work: TODAY (on 1st floor): Lipid panel, LFTs, LP(a) If you have labs (blood work) drawn today and your tests are completely normal, you will receive your results only by: MyChart Message (if you have MyChart) OR A paper copy in the mail If you have any lab test that is abnormal or we need to change your treatment, we will call you to review the results.  Testing/Procedures: Your physician has requested that you have an echocardiogram. Echocardiography is a painless test that uses sound waves to create images of your heart. It provides your doctor with information about the size and shape of your heart and how well your heart's chambers and valves are working. This procedure takes approximately one hour. There are no restrictions for this procedure. Please do NOT wear cologne, perfume, aftershave, or lotions (deodorant is allowed). Please arrive 15 minutes prior to your appointment time.  Please note: We ask at that you not bring children with you during ultrasound (echo/ vascular) testing. Due to room size and safety concerns, children are not allowed in the ultrasound rooms during exams. Our front office staff cannot provide observation of children in our lobby area while testing is being conducted. An adult accompanying a patient to their appointment will only be allowed in the ultrasound room at the discretion of the ultrasound technician under special circumstances. We apologize for any inconvenience.   Follow-Up: At [facility], you and your health needs are our priority.  As part of our continuing mission to provide you with exceptional heart care, our providers are all part of one team.  This  team includes your primary Cardiologist (physician) and Advanced Practice Providers or APPs (Physician Assistants and Nurse Practitioners) who all work together to provide you with the care you need, when you need it.  Your next appointment:   6 month(s)  Provider:   Arun K Thukkani, MD or Glendia Ferrier, PA-C    We recommend signing up for the patient portal called MyChart.  Sign up information is provided on this After Visit Summary.  MyChart is used to connect with patients for Virtual Visits (Telemedicine).  Patients are able to view lab/test results, encounter notes, upcoming appointments, etc.  Non-urgent messages can be sent to your provider as well.    To learn more about what you can do with MyChart, go to [email or web address].

## 2023-10-20 ENCOUNTER — Ambulatory Visit: Admitting: Physician Assistant

## 2023-10-20 ENCOUNTER — Telehealth: Payer: Self-pay

## 2023-10-20 ENCOUNTER — Ambulatory Visit: Payer: Self-pay | Admitting: Internal Medicine

## 2023-10-20 ENCOUNTER — Encounter: Payer: Self-pay | Admitting: Physician Assistant

## 2023-10-20 VITALS — Ht 66.0 in | Wt 274.0 lb

## 2023-10-20 DIAGNOSIS — E7841 Elevated Lipoprotein(a): Secondary | ICD-10-CM

## 2023-10-20 DIAGNOSIS — M810 Age-related osteoporosis without current pathological fracture: Secondary | ICD-10-CM | POA: Diagnosis not present

## 2023-10-20 LAB — LIPID PANEL
Chol/HDL Ratio: 3.6 ratio (ref 0.0–4.4)
Cholesterol, Total: 162 mg/dL (ref 100–199)
HDL: 45 mg/dL (ref >39)
LDL Chol Calc (NIH): 98 mg/dL (ref 0–99)
Triglycerides: 105 mg/dL (ref 0–149)
VLDL Cholesterol Cal: 19 mg/dL (ref 5–40)

## 2023-10-20 LAB — HEPATIC FUNCTION PANEL
ALT: 9 IU/L (ref 0–32)
AST: 13 IU/L (ref 0–40)
Albumin: 4.1 g/dL (ref 3.9–4.9)
Alkaline Phosphatase: 159 IU/L — ABNORMAL HIGH (ref 49–135)
Bilirubin Total: 0.3 mg/dL (ref 0.0–1.2)
Bilirubin, Direct: 0.13 mg/dL (ref 0.00–0.40)
Total Protein: 7.2 g/dL (ref 6.0–8.5)

## 2023-10-20 LAB — LIPOPROTEIN A (LPA): Lipoprotein (a): 334.2 nmol/L — AB (ref <75.0)

## 2023-10-20 NOTE — Addendum Note (Signed)
 Addended by: RODGERS LACY on: 10/20/2023 10:52 AM   Modules accepted: Orders

## 2023-10-20 NOTE — Progress Notes (Signed)
 Office Visit Note   Patient: [name]           Date of Birth: 1955/09/27           MRN: [id number] Visit Date: 10/20/2023              Requested by: Theo Iha, MD [address] [place],  [place] [address] PCP: Theo Iha, MD   Assessment & Plan: Visit Diagnoses:  1. Age-related osteoporosis without current pathological fracture     Plan: Patient is a pleasant 68 year old woman referred for evaluation of osteoporosis by Dr. Georgina.  She has never been treated for osteoporosis in the past.  She does have a history of a left hip fracture in 2024.  No history of heart disease.  No history of cancer.  She does have advanced kidney disease and has been doing dialysis for about 10 to 15 years.  She does not have a history of ulcers or gastric bypass no history of reflux or seizures.  She does not know when she went through menopause.  She does not know if she takes any calcium  or vitamin D.  She has never had hormone replacement therapy.  Never smoker she does not consume alcoholic beverages she does not currently exercise.  No recent dental issues.  Does have a history of a hip fracture in her dad.  She has certainly more complicated but her most recent T-score was of -3.9.  FRAX score is 19 for a 10-year risk of any osteoporotic fracture and 8.7 for hip fracture.  She also has some compression in her lumbar spine.  I do think she would benefit from Prolia..  Certainly she would have to be monitored more closely with regards to her calcium .  She already gets blood work routinely done at the dialysis unit.  She is gena fax the most recent labs as I have no labs since January.  But I would say that she would have to have a calcium  vitamin D centimeters PE every month.  This could be coordinated with her dialysis unit.  I spent over 45 minutes reviewing her chart and speaking with her about various medications and lifestyle changes she could make.  She is willing to look  at her diet a little bit better and she is diabetic.  With regards to exercise she said at 1 time she did do some water aerobics would consider that again.  All of her questions were answered she was given information regarding vitamin D calcium  exercising and Prolia.  We will try and authorize for Prolia.  May have to go with biosimilar.  She is not a candidate for Fosamax at all because of her kidneys  Follow-Up Instructions: Return if symptoms worsen or fail to improve.   Orders:  No orders of the defined types were placed in this encounter.  No orders of the defined types were placed in this encounter.     Procedures: No procedures performed   Clinical Data: No additional findings.   Subjective: No chief complaint on file.   HPI patient is a pleasant 68 year old woman referred by Dr. Georgina for evaluation of osteoporosis  Review of Systems  All other systems reviewed and are negative.    Objective: Vital Signs: There were no vitals taken for this visit.  Physical Exam Constitutional:      Appearance: Normal appearance.  Pulmonary:     Effort: Pulmonary effort is normal.     Breath sounds: Normal  breath sounds.  Neurological:     General: No focal deficit present.     Mental Status: She is alert and oriented to person, place, and time.  Psychiatric:        Mood and Affect: Mood normal.        Behavior: Behavior normal.       Specialty Comments:  CLINICAL DATA:  Low back pain radiating into the left hip since 2022. No acute injury or prior relevant surgery. Previous injection.   EXAM: MRI LUMBAR SPINE WITHOUT CONTRAST   TECHNIQUE: Multiplanar, multisequence MR imaging of the lumbar spine was performed. No intravenous contrast was administered.   COMPARISON:  Lumbar MRI 02/04/2019   FINDINGS: Segmentation: Conventional anatomy assumed, with the last open disc space designated L5-S1.Concurrent with prior imaging.   Alignment:  Stable chronic grade 1  anterolisthesis at L4-5.   Vertebrae: No worrisome osseous lesion, acute fracture or pars defect. There are multilevel endplate degenerative changes in the thoracolumbar spine with evidence of interval interbody ankylosis at L4-5. Interval chronic appearing Schmorl's node in the superior endplate of L4. Ankylosing lower thoracic paraspinal osteophytes. Underlying congenitally short lumbar pedicles.   Conus medullaris: Extends to the L1 level and appears normal.   Paraspinal and other soft tissues: No significant paraspinal findings.   Disc levels:   Sagittal images demonstrate progressive multilevel spondylosis with ankylosing paraspinal osteophytes in the lower thoracic spine. There is mild right-sided foraminal narrowing at T10-11 and T11-12. No cord deformity.   L1-2: Mild disc bulging with anterior osteophytes. No significant spinal stenosis or foraminal narrowing.   L2-3: Preserved disc height with mild disc bulging and facet hypertrophy. No significant spinal stenosis or foraminal narrowing.   L3-4: Progressive loss of disc height with progressive annular disc bulging, facet and ligamentous hypertrophy. Resulting increased central canal stenosis, now moderate. There is mild to moderate left greater than right lateral recess and foraminal narrowing.   L4-5: Chronic spondylosis with interval interbody ankylosis. Advanced facet disease with probable interfacetal ankylosis. Resulting chronic moderate multifactorial spinal stenosis and moderate lateral recess and foraminal narrowing bilaterally are similar to the previous study.   L5-S1: Chronic loss of disc height with annular disc bulging and endplate osteophytes. Moderate bilateral facet hypertrophy. Mild spinal stenosis and mild lateral recess narrowing bilaterally. There is chronic moderate to severe foraminal narrowing bilaterally.   IMPRESSION: 1. Compared with previous MRI from 02/04/2019, there is  progressive multilevel thoracolumbar spondylosis superimposed on a congenitally small canal. Evidence for multilevel ankylosing osteophytes in the lower thoracic spine. 2. Progressive spondylosis at L3-4 with resulting moderate multifactorial spinal stenosis and mild to moderate left greater than right lateral recess and foraminal narrowing. 3. Chronic spondylosis at L4-5 with interval interbody and probable interfacetal ankylosis. Resulting chronic moderate multifactorial spinal stenosis and moderate lateral recess and foraminal narrowing bilaterally. 4. Chronic mild multifactorial spinal stenosis and moderate to severe foraminal narrowing bilaterally at L5-S1. 5. No acute findings or clear explanation for the patient's symptoms.     Electronically Signed   By: Elsie Perone M.D.   On: 11/03/2022 14:51  Imaging: No results found.   PMFS History: Patient Active Problem List   Diagnosis Date Noted   Age-related osteoporosis without current pathological fracture 10/20/2023   OA (osteoarthritis) of knee 02/08/2022   Total knee replacement status, left 02/08/2022   Pain in left hand 06/29/2021   Cerebrovascular disease 05/11/2020   Gout 03/09/2020   Allergic rhinitis 01/25/2020   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  Colon cancer screening 01/25/2020   Gastroesophageal reflux disease without esophagitis 01/25/2020   Hyperlipidemia 01/25/2020   Morbid obesity (HCC) 01/25/2020   History of colonic polyps 01/25/2020   OSA (obstructive sleep apnea) 01/25/2020   Vitamin D deficiency 01/25/2020   Controlled type 2 diabetes mellitus without complication (HCC) 11/10/2019   Chronic kidney disease, stage 4 (severe) (HCC) 06/29/2019   Long term (current) use of insulin  (HCC) 06/29/2019   Epidural lipomatosis 03/02/2019   Spondylolisthesis of lumbar region 03/02/2019   Spinal stenosis of lumbar region with neurogenic claudication 03/02/2019   Left wrist tendinitis  09/07/2018   Unilateral primary osteoarthritis, left knee 04/03/2018   Celiac disease 08/21/2016   Abnormal thyroid function test 04/01/2016   Pain in thumb joint with movement of right hand 09/14/2015   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   Moderate major depression, single episode (HCC) 02/02/2010   Diabetic renal disease (HCC) 06/07/2009   Anemia 01/18/2009   Essential hypertension 01/18/2009   Past Medical History:  Diagnosis Date   Allergic rhinitis    Anemia    Arthritis    Celiac disease    Cerebrovascular disease    Diabetes mellitus    End stage renal disease (HCC)    Epidural lipomatosis    ESRD on HD    T Th Sa   GERD (gastroesophageal reflux disease)    Goiter    Gout    Heart murmur    Hyperlipemia    Hypertension    Osteoarthritis    Seasonal allergies    Sleep apnea     Family History  Problem Relation Age of Onset   BRCA 1/2 Neg Hx    Breast cancer Neg Hx     Past Surgical History:  Procedure Laterality Date   AV FISTULA PLACEMENT Left 04/10/2020   Procedure: LEFT ARM ARTERIOVENOUS (AV) FISTULA CREATION;  Surgeon: Harvey Carlin BRAVO, MD;  Location: [facility] OR;  Service: Vascular;  Laterality: Left;   CAPSULOTOMY  01/13/2012   Procedure: MINOR CAPSULOTOMY;  Surgeon: Debby BRAVO Sharalyn Mickey., MD;  Location: [facility] OR;  Service: Ophthalmology;  Laterality: Left;   DORSAL COMPARTMENT RELEASE  01/22/2007   rt    FISTULA SUPERFICIALIZATION Left 05/29/2020   Procedure: LEFT UPPER ARM FISTULA SUPERFICIALIZATION WITH LIGATION OF MULTIPLE SIDE BRANCHES;  Surgeon: Harvey Carlin BRAVO, MD;  Location: [facility] OR;  Service: Vascular;  Laterality: Left;   KNEE ARTHROSCOPY  02/21/2002   right and left   STERIOD INJECTION  08/01/2011   Procedure: STEROID INJECTION;  Surgeon: Lamar LULLA Leonor Mickey., MD;  Location: [facility];  Service: Orthopedics;  Laterality: Left;  cmc left thumb   TONSILLECTOMY     TOTAL KNEE ARTHROPLASTY Left 02/08/2022   Procedure: LEFT TOTAL KNEE  ARTHROPLASTY;  Surgeon: Harden Jerona LULLA, MD;  Location: [facility] OR;  Service: Orthopedics;  Laterality: Left;   TRIGGER FINGER RELEASE  01/22/2004   rt thumb   TRIGGER FINGER RELEASE  08/01/2011   Procedure: RELEASE TRIGGER FINGER/A-1 PULLEY;  Surgeon: Lamar LULLA Leonor Mickey., MD;  Location: [facility];  Service: Orthopedics;  Laterality: Left;  left thumb   YAG LASER APPLICATION  01/13/2012   Procedure: YAG LASER APPLICATION;  Surgeon: Debby BRAVO Sharalyn Mickey., MD;  Location: [facility] OR;  Service: Ophthalmology;  Laterality: Left;   Social History   Occupational History   Not on file  Tobacco Use   Smoking status: Never   Smokeless tobacco: Never  Vaping Use   Vaping status: Never  Used  Substance and Sexual Activity   Alcohol use: No   Drug use: No   Sexual activity: Not on file

## 2023-10-21 ENCOUNTER — Other Ambulatory Visit: Payer: Self-pay | Admitting: Physician Assistant

## 2023-10-21 ENCOUNTER — Telehealth: Payer: Self-pay

## 2023-10-21 DIAGNOSIS — M810 Age-related osteoporosis without current pathological fracture: Secondary | ICD-10-CM

## 2023-10-21 MED ORDER — DENOSUMAB 60 MG/ML ~~LOC~~ SOSY
60.0000 mg | PREFILLED_SYRINGE | Freq: Once | SUBCUTANEOUS | Status: AC
  Administered 2023-11-19: 60 mg via SUBCUTANEOUS

## 2023-10-23 MED ORDER — ATORVASTATIN CALCIUM 40 MG PO TABS: 40.0000 mg | ORAL_TABLET | Freq: Every day | ORAL | 3 refills | Status: AC

## 2023-10-30 ENCOUNTER — Other Ambulatory Visit: Payer: Self-pay | Admitting: Radiology

## 2023-11-07 ENCOUNTER — Ambulatory Visit: Admitting: Physician Assistant

## 2023-11-07 ENCOUNTER — Telehealth: Payer: Self-pay | Admitting: Physician Assistant

## 2023-11-07 ENCOUNTER — Ambulatory Visit

## 2023-11-07 VITALS — BP 116/53 | HR 84 | Temp 98.7°F | Ht 66.0 in | Wt 270.8 lb

## 2023-11-07 DIAGNOSIS — Z9989 Dependence on other enabling machines and devices: Secondary | ICD-10-CM | POA: Diagnosis not present

## 2023-11-07 DIAGNOSIS — G4733 Obstructive sleep apnea (adult) (pediatric): Secondary | ICD-10-CM | POA: Diagnosis not present

## 2023-11-07 DIAGNOSIS — Z6841 Body Mass Index (BMI) 40.0 and over, adult: Secondary | ICD-10-CM

## 2023-11-07 NOTE — Progress Notes (Signed)
 Pulmonology Office Visit   Subjective:  Patient ID: Chelsey Lee, female    DOB: 1955-02-10  MRN: [id number]  Referred by: Theo Iha, MD  CC: No chief complaint on file.   HPI Chelsey Lee is a 68 y.o. female non-smoker with Allergic rhinitis, GERD, DM2, hyperlipidemia, celiac disease, already on HD and OSA.  Discussed the use of AI scribe software for clinical note transcription with the patient, who gave verbal consent to proceed.  History of Present Illness   Chelsey Lee is a 68 year old female with sleep apnea who presents for follow-up regarding CPAP compliance. She was previously under the care of Dr. Neysa for sleep apnea management.  She has a history of sleep apnea diagnosed in 2017. She had been using a CPAP machine but stopped for about three months due to perceived ineffectiveness. She resumed use this week and is adjusting to a hybrid mask that fits under her nose.  She sleeps on her side and occasionally on her back. She is unsure if she snores but wakes up twice a night, which is unchanged from before resuming CPAP use. Her sleep schedule involves going to bed around midnight, taking about 15 minutes to fall asleep, and waking up at 7 AM for dialysis. She naps on dialysis days.  In 2023, a pulse oximetry test was conducted while she was on CPAP. Her oxygenation on CPAP was normal. She is not using supplemental oxygen with her CPAP.  No cough, fever, chills, nausea, vomiting, chest pain, or shortness of breath. She can walk on a flat surface without experiencing shortness of breath.      OSA history: NPSG 04/04/15 AHI 24.4/ hr, desaturation to 77%, Body weight 278 lbs  Was following Dr. Neysa. Has been on CPAP with pressure of 11 cm H2O found after titration study.   PAP download compliance data: no recent used. Last data pull was in Oct 2024.   PRIOR TESTS and IMAGING: ECHO: Echo 2016: EF normal.  Left atrium mildly dilated.      05/11/2021    10:06 AM  Results of the Epworth flowsheet  Sitting and reading 1  Watching TV 1  Sitting, inactive in a public place (e.g. a theatre or a meeting) 0  As a passenger in a car for an hour without a break 0  Lying down to rest in the afternoon when circumstances permit 3  Sitting and talking to someone 0  Sitting quietly after a lunch without alcohol 2  In a car, while stopped for a few minutes in traffic 0  Total score 7    Allergies: Patient has no known allergies.  Current Outpatient Medications:    allopurinol  (ZYLOPRIM ) 100 MG tablet, Take 1 tablet (100 mg total) by mouth daily., Disp: 60 tablet, Rfl: 3   ammonium lactate  (AMLACTIN) 12 % lotion, Apply 1 Application topically as needed for dry skin., Disp: 400 g, Rfl: 0   atorvastatin  (LIPITOR) 40 MG tablet, Take 1 tablet (40 mg total) by mouth daily., Disp: 30 tablet, Rfl: 3   AURYXIA  1 GM 210 MG(Fe) tablet, Take 210-420 mg by mouth See admin instructions. 420 mg three times daily with meals, and 210 mg twice daily with snacks, Disp: , Rfl:    B Complex-C-Folic Acid  (DIALYVITE TABLET) TABS, Take 1 tablet by mouth daily., Disp: , Rfl:    buPROPion  (WELLBUTRIN  SR) 150 MG 12 hr tablet, Take 150 mg by mouth 2 (two) times daily., Disp: , Rfl:  carvedilol  (COREG ) 25 MG tablet, Take 25 mg by mouth 2 (two) times daily with a meal., Disp: , Rfl:    CHELATED MAGNESIUM  PO, Take 2 tablets by mouth every evening., Disp: , Rfl:    Continuous Blood Gluc Sensor (FREESTYLE LIBRE 2 SENSOR) MISC, , Disp: , Rfl:    escitalopram  (LEXAPRO ) 10 MG tablet, Take 10 mg by mouth daily., Disp: , Rfl:    FREESTYLE LITE test strip, SMARTSIG:Via Meter 2-3 Times Daily, Disp: , Rfl:    glucose blood (ONETOUCH ULTRA) test strip, , Disp: , Rfl:    insulin  NPH-regular Human (70-30) 100 UNIT/ML injection, Inject 40-50 Units into the skin See admin instructions. Inject 50 units into the skin in the morning and 40 units at night, Disp: , Rfl:    isosorbide  mononitrate  (IMDUR ) 30 MG 24 hr tablet, Take 30 mg by mouth daily., Disp: , Rfl:    LINZESS  145 MCG CAPS capsule, Take 145 mcg by mouth daily., Disp: , Rfl:    predniSONE  (DELTASONE ) 50 MG tablet, Take 1 tablet (50 mg total) by mouth daily with breakfast. Take until completed., Disp: 5 tablet, Rfl: 0  Current Facility-Administered Medications:    denosumab (PROLIA) injection 60 mg, 60 mg, Subcutaneous, Once, Persons, Ronal Dragon, PA Past Medical History:  Diagnosis Date   Allergic rhinitis    Anemia    Arthritis    Celiac disease    Cerebrovascular disease    Diabetes mellitus    End stage renal disease (HCC)    Epidural lipomatosis    ESRD on HD    T Th Sa   GERD (gastroesophageal reflux disease)    Goiter    Gout    Heart murmur    Hyperlipemia    Hypertension    Osteoarthritis    Seasonal allergies    Sleep apnea    Past Surgical History:  Procedure Laterality Date   AV FISTULA PLACEMENT Left 04/10/2020   Procedure: LEFT ARM ARTERIOVENOUS (AV) FISTULA CREATION;  Surgeon: Harvey Carlin BRAVO, MD;  Location: [facility] OR;  Service: Vascular;  Laterality: Left;   CAPSULOTOMY  01/13/2012   Procedure: MINOR CAPSULOTOMY;  Surgeon: Debby BRAVO Sharalyn Mickey., MD;  Location: [facility] OR;  Service: Ophthalmology;  Laterality: Left;   DORSAL COMPARTMENT RELEASE  01/22/2007   rt    FISTULA SUPERFICIALIZATION Left 05/29/2020   Procedure: LEFT UPPER ARM FISTULA SUPERFICIALIZATION WITH LIGATION OF MULTIPLE SIDE BRANCHES;  Surgeon: Harvey Carlin BRAVO, MD;  Location: [facility] OR;  Service: Vascular;  Laterality: Left;   KNEE ARTHROSCOPY  02/21/2002   right and left   STERIOD INJECTION  08/01/2011   Procedure: STEROID INJECTION;  Surgeon: Lamar LULLA Leonor Mickey., MD;  Location: [facility];  Service: Orthopedics;  Laterality: Left;  cmc left thumb   TONSILLECTOMY     TOTAL KNEE ARTHROPLASTY Left 02/08/2022   Procedure: LEFT TOTAL KNEE ARTHROPLASTY;  Surgeon: Harden Jerona LULLA, MD;  Location: [facility] OR;  Service:  Orthopedics;  Laterality: Left;   TRIGGER FINGER RELEASE  01/22/2004   rt thumb   TRIGGER FINGER RELEASE  08/01/2011   Procedure: RELEASE TRIGGER FINGER/A-1 PULLEY;  Surgeon: Lamar LULLA Leonor Mickey., MD;  Location: [facility];  Service: Orthopedics;  Laterality: Left;  left thumb   YAG LASER APPLICATION  01/13/2012   Procedure: YAG LASER APPLICATION;  Surgeon: Debby BRAVO Sharalyn Mickey., MD;  Location: [facility] OR;  Service: Ophthalmology;  Laterality: Left;   Family History  Problem Relation Age of  Onset   BRCA 1/2 Neg Hx    Breast cancer Neg Hx    Social History   Socioeconomic History   Marital status: Single    Spouse name: Not on file   Number of children: Not on file   Years of education: Not on file   Highest education level: Not on file  Occupational History   Not on file  Tobacco Use   Smoking status: Never   Smokeless tobacco: Never  Vaping Use   Vaping status: Never Used  Substance and Sexual Activity   Alcohol use: No   Drug use: No   Sexual activity: Not on file  Other Topics Concern   Not on file  Social History Narrative   Not on file   Social Drivers of Health   Financial Resource Strain: Not on file  Food Insecurity: No Food Insecurity (02/08/2022)   Hunger Vital Sign    Worried About Running Out of Food in the Last Year: Never true    Ran Out of Food in the Last Year: Never true  Transportation Needs: No Transportation Needs (02/08/2022)   PRAPARE - Administrator, Civil Service (Medical): No    Lack of Transportation (Non-Medical): No  Physical Activity: Not on file  Stress: Not on file  Social Connections: Not on file  Intimate Partner Violence: Not At Risk (02/08/2022)   Humiliation, Afraid, Rape, and Kick questionnaire    Fear of Current or Ex-Partner: No    Emotionally Abused: No    Physically Abused: No    Sexually Abused: No       Objective:  BP (!) 116/53   Pulse 84   Temp 98.7 F (37.1 C) (Oral)   Ht 5' 6 (1.676 m)    Wt 270 lb 12.8 oz (122.8 kg)   SpO2 99%   BMI 43.71 kg/m  BMI Readings from Last 3 Encounters:  11/07/23 43.71 kg/m  10/20/23 44.22 kg/m  10/17/23 44.16 kg/m    Physical Exam: Physical Exam   ENT: Normal mucosa. No hypertrophy of inferior turbinates. Tonsils are normal sized. Modified Mallampati score is normal. Oral cavity normal. PULMONARY: Lungs clear to auscultation bilaterally, no adventitious breath sounds. CARDIOVASCULAR: Regular rate and rhythm, S1 S2 normal, no murmurs. ABDOMEN: Abdomen soft, nontender. Bowel sounds are normal. EXTREMITIES: No peripheral edema noted.       Diagnostic Review:  Last CBC Lab Results  Component Value Date   WBC 7.1 02/09/2023   HGB 12.4 02/09/2023   HCT 38.4 02/09/2023   MCV 101.1 (H) 02/09/2023   MCH 32.6 02/09/2023   RDW 13.8 02/09/2023   PLT 155 02/09/2023         Assessment & Plan:  Assessment and Plan    Obstructive sleep apnea Resumed CPAP with hybrid mask. Nocturnal oximetry data showed good oxygenation on CPAP. No supplemental oxygen needed. - Encouraged continued CPAP use with hybrid mask. - Advised side sleeping. - Reassess CPAP compliance and effectiveness in three months.     Morbid Obesity: - Follows PCP. Was on ozempic , discontinued due to GI side effects. - Discussed with PCP about possibility of going on Zepbound especially with sleep apnea.    Return in about 3 months (around 02/07/2024).   I personally spent a total of 30 minutes in the care of the patient today including preparing to see the patient, getting/reviewing separately obtained history, performing a medically appropriate exam/evaluation, counseling and educating, placing orders, documenting clinical information in the EHR, independently  interpreting results, and communicating results.   Kealan Buchan, MD

## 2023-11-07 NOTE — Telephone Encounter (Signed)
 Patient called just returning your call. CB#[id number]

## 2023-11-07 NOTE — Patient Instructions (Addendum)
  VISIT SUMMARY: Today, you came in for a follow-up visit regarding your sleep apnea and CPAP machine use. You have resumed using your CPAP machine with a new hybrid mask after a three-month break. You are adjusting to the new mask and your sleep pattern remains mostly unchanged.  YOUR PLAN: -OBSTRUCTIVE SLEEP APNEA: Obstructive sleep apnea is a condition where your airway becomes blocked during sleep, causing breathing pauses. You have resumed using your CPAP machine with a hybrid mask, which is helping to maintain good oxygen levels. You are encouraged to continue using the CPAP machine every night and to try sleeping on your side. We will reassess your CPAP compliance and its effectiveness in three months. If everything remains stable, we will schedule your next follow-up in one year.  INSTRUCTIONS: Please continue using your CPAP machine with the hybrid mask every night and try to sleep on your side. We will reassess your CPAP compliance and effectiveness in three months. If everything is stable, we will schedule your next follow-up in one year.      DISCUSS WITH PCP IF ZEPBOUND WILL BE A GOOD OPTION FOR YOU WHICH USUALLY HAVE LOWER SIDE EFFECTS THAN OZEMPIC .                                         Contains text generated by Abridge.

## 2023-11-19 ENCOUNTER — Ambulatory Visit (INDEPENDENT_AMBULATORY_CARE_PROVIDER_SITE_OTHER): Admitting: Physician Assistant

## 2023-11-19 DIAGNOSIS — M810 Age-related osteoporosis without current pathological fracture: Secondary | ICD-10-CM | POA: Diagnosis not present

## 2023-11-19 NOTE — Progress Notes (Signed)
 Office Visit Note   Patient: Chelsey Lee           Date of Birth: [birth year]           MRN: [id number] Visit Date: 11/19/2023              Requested by: Theo Iha, MD [address] [place],  [place] [address] PCP: Theo Iha, MD  Chief Complaint  Patient presents with   osteoporosis management    Prolia injection      HPI: Patient is a pleasant 68 year old woman with a history of diabetes and is currently on dialysis.  She also has a history of osteoporosis.  She has been approved of for Prolia  Assessment & Plan: Visit Diagnoses:  1. Age-related osteoporosis without current pathological fracture     Plan: Injection given without difficulty.  Because of her history of dialysis we will draw a monthly calcium  vitamin D.  She can have this done when she gets her dialysis I will give her prescription today.  She currently takes no calcium  supplement and does not have hypocalcemia but with having being a dialysis patient combined with taking Prolia her calcium  is to be monitored I would like her to take a 500 mg daily calcium  supplement and we will check her calcium  monthly through dialysis infusion center.  Have discussed with her if she is has any unusual cramping paresthesias to contact our office  Lot [id number]  Follow-Up Instructions: 6 months 1 day  Ortho Exam  Patient is alert, oriented, no adenopathy, well-dressed, normal affect, normal respiratory effort.     Imaging: No results found. No images are attached to the encounter.  Labs: Lab Results  Component Value Date   HGBA1C 7.3 (H) 02/08/2022   LABURIC 8.9 (H) 03/06/2020     Lab Results  Component Value Date   ALBUMIN 4.1 10/17/2023   ALBUMIN 2.8 (L) 02/12/2022   ALBUMIN 3.3 (L) 02/09/2022    No results found for: MG No results found for: VD25OH  No results found for: PREALBUMIN    Latest Ref Rng & Units 02/09/2023   12:54 PM 02/12/2022    2:48 AM  02/09/2022    3:19 PM  CBC EXTENDED  WBC 4.0 - 10.5 K/uL 7.1  8.3  8.4   RBC 3.87 - 5.11 MIL/uL 3.80  2.96  3.19   Hemoglobin 12.0 - 15.0 g/dL 87.5  9.6  89.8   HCT 63.9 - 46.0 % 38.4  28.0  31.3   Platelets 150 - 400 K/uL 155  196  197   NEUT# 1.7 - 7.7 K/uL 4.5     Lymph# 0.7 - 4.0 K/uL 1.8        There is no height or weight on file to calculate BMI.  Orders:  No orders of the defined types were placed in this encounter.  No orders of the defined types were placed in this encounter.    Procedures: No procedures performed  Clinical Data: No additional findings.  ROS:  All other systems negative, except as noted in the HPI. Review of Systems  Objective: Vital Signs: There were no vitals taken for this visit.  Specialty Comments:  CLINICAL DATA:  Low back pain radiating into the left hip since 2022. No acute injury or prior relevant surgery. Previous injection.   EXAM: MRI LUMBAR SPINE WITHOUT CONTRAST   TECHNIQUE: Multiplanar, multisequence MR imaging of the lumbar spine was performed. No intravenous contrast was administered.  COMPARISON:  Lumbar MRI 02/04/2019   FINDINGS: Segmentation: Conventional anatomy assumed, with the last open disc space designated L5-S1.Concurrent with prior imaging.   Alignment:  Stable chronic grade 1 anterolisthesis at L4-5.   Vertebrae: No worrisome osseous lesion, acute fracture or pars defect. There are multilevel endplate degenerative changes in the thoracolumbar spine with evidence of interval interbody ankylosis at L4-5. Interval chronic appearing Schmorl's node in the superior endplate of L4. Ankylosing lower thoracic paraspinal osteophytes. Underlying congenitally short lumbar pedicles.   Conus medullaris: Extends to the L1 level and appears normal.   Paraspinal and other soft tissues: No significant paraspinal findings.   Disc levels:   Sagittal images demonstrate progressive multilevel spondylosis  with ankylosing paraspinal osteophytes in the lower thoracic spine. There is mild right-sided foraminal narrowing at T10-11 and T11-12. No cord deformity.   L1-2: Mild disc bulging with anterior osteophytes. No significant spinal stenosis or foraminal narrowing.   L2-3: Preserved disc height with mild disc bulging and facet hypertrophy. No significant spinal stenosis or foraminal narrowing.   L3-4: Progressive loss of disc height with progressive annular disc bulging, facet and ligamentous hypertrophy. Resulting increased central canal stenosis, now moderate. There is mild to moderate left greater than right lateral recess and foraminal narrowing.   L4-5: Chronic spondylosis with interval interbody ankylosis. Advanced facet disease with probable interfacetal ankylosis. Resulting chronic moderate multifactorial spinal stenosis and moderate lateral recess and foraminal narrowing bilaterally are similar to the previous study.   L5-S1: Chronic loss of disc height with annular disc bulging and endplate osteophytes. Moderate bilateral facet hypertrophy. Mild spinal stenosis and mild lateral recess narrowing bilaterally. There is chronic moderate to severe foraminal narrowing bilaterally.   IMPRESSION: 1. 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 Evidence for multilevel ankylosing osteophytes in the lower thoracic spine. 2. Progressive spondylosis at L3-4 with resulting moderate multifactorial spinal stenosis and mild to moderate left greater than right lateral recess and foraminal narrowing. 3. Chronic spondylosis at L4-5 with interval interbody and probable interfacetal ankylosis. Resulting chronic moderate multifactorial spinal stenosis and moderate lateral recess and foraminal narrowing bilaterally. 4. Chronic mild multifactorial spinal stenosis and moderate to severe foraminal  narrowing bilaterally at L5-S1. 5. No acute findings or clear explanation for the patient's symptoms.     Electronically Signed   By: Elsie Perone M.D.   On: 11/03/2022 14:51  PMFS History: Patient Active Problem List   Diagnosis Date Noted   Age-related osteoporosis without current pathological fracture 10/20/2023   OA (osteoarthritis) of knee 02/08/2022   Total knee replacement status, left 02/08/2022   Pain in left hand 06/29/2021   Cerebrovascular disease 05/11/2020   Gout 03/09/2020   Allergic rhinitis 01/25/2020   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   Colon cancer screening 01/25/2020   Gastroesophageal reflux disease without esophagitis 01/25/2020   Hyperlipidemia 01/25/2020   Morbid obesity (HCC) 01/25/2020   History of colonic polyps 01/25/2020   OSA (obstructive sleep apnea) 01/25/2020   Vitamin D deficiency 01/25/2020   Controlled type 2 diabetes mellitus without complication (HCC) 11/10/2019   Chronic kidney disease, stage 4 (severe) (HCC) 06/29/2019   Long term (current) use of insulin  (HCC) 06/29/2019   Epidural lipomatosis 03/02/2019   Spondylolisthesis of lumbar region 03/02/2019   Spinal stenosis of lumbar region with neurogenic claudication 03/02/2019   Left wrist tendinitis 09/07/2018   Unilateral primary osteoarthritis, left knee 04/03/2018   Celiac disease 08/21/2016  Abnormal thyroid function test 04/01/2016   Pain in thumb joint with movement of right hand 09/14/2015   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   Moderate major depression, single episode (HCC) 02/02/2010   Diabetic renal disease (HCC) 06/07/2009   Anemia 01/18/2009   Essential hypertension 01/18/2009   Past Medical History:  Diagnosis Date   Allergic rhinitis    Anemia    Arthritis    Celiac disease    Cerebrovascular disease    Diabetes mellitus    End stage renal disease (HCC)    Epidural lipomatosis    ESRD on HD    T Th Sa   GERD (gastroesophageal reflux disease)     Goiter    Gout    Heart murmur    Hyperlipemia    Hypertension    Osteoarthritis    Seasonal allergies    Sleep apnea     Family History  Problem Relation Age of Onset   BRCA 1/2 Neg Hx    Breast cancer Neg Hx     Past Surgical History:  Procedure Laterality Date   AV FISTULA PLACEMENT Left 04/10/2020   Procedure: LEFT ARM ARTERIOVENOUS (AV) FISTULA CREATION;  Surgeon: Harvey Carlin BRAVO, MD;  Location: [facility] OR;  Service: Vascular;  Laterality: Left;   CAPSULOTOMY  01/13/2012   Procedure: MINOR CAPSULOTOMY;  Surgeon: Debby BRAVO Sharalyn Mickey., MD;  Location: [facility] OR;  Service: Ophthalmology;  Laterality: Left;   DORSAL COMPARTMENT RELEASE  01/22/2007   rt    FISTULA SUPERFICIALIZATION Left 05/29/2020   Procedure: LEFT UPPER ARM FISTULA SUPERFICIALIZATION WITH LIGATION OF MULTIPLE SIDE BRANCHES;  Surgeon: Harvey Carlin BRAVO, MD;  Location: [facility] OR;  Service: Vascular;  Laterality: Left;   KNEE ARTHROSCOPY  02/21/2002   right and left   STERIOD INJECTION  08/01/2011   Procedure: STEROID INJECTION;  Surgeon: Lamar LULLA Leonor Mickey., MD;  Location: [facility];  Service: Orthopedics;  Laterality: Left;  cmc left thumb   TONSILLECTOMY     TOTAL KNEE ARTHROPLASTY Left 02/08/2022   Procedure: LEFT TOTAL KNEE ARTHROPLASTY;  Surgeon: Harden Jerona LULLA, MD;  Location: [facility] OR;  Service: Orthopedics;  Laterality: Left;   TRIGGER FINGER RELEASE  01/22/2004   rt thumb   TRIGGER FINGER RELEASE  08/01/2011   Procedure: RELEASE TRIGGER FINGER/A-1 PULLEY;  Surgeon: Lamar LULLA Leonor Mickey., MD;  Location: [facility];  Service: Orthopedics;  Laterality: Left;  left thumb   YAG LASER APPLICATION  01/13/2012   Procedure: YAG LASER APPLICATION;  Surgeon: Debby BRAVO Sharalyn Mickey., MD;  Location: [facility] OR;  Service: Ophthalmology;  Laterality: Left;   Social History   Occupational History   Not on file  Tobacco Use   Smoking status: Never   Smokeless tobacco: Never  Vaping Use   Vaping status:  Never Used  Substance and Sexual Activity   Alcohol use: No   Drug use: No   Sexual activity: Not on file

## 2023-11-24 ENCOUNTER — Encounter: Payer: Self-pay | Admitting: Radiology

## 2023-11-28 ENCOUNTER — Ambulatory Visit (HOSPITAL_COMMUNITY)
Admission: RE | Admit: 2023-11-28 | Discharge: 2023-11-28 | Disposition: A | Discharge status: Home / Self Care | Source: Ambulatory Visit | Attending: Internal Medicine | Admitting: Internal Medicine

## 2023-11-28 DIAGNOSIS — R011 Cardiac murmur, unspecified: Secondary | ICD-10-CM

## 2023-11-28 LAB — ECHOCARDIOGRAM COMPLETE
AR max vel: 1.71 cm2
AV Area VTI: 1.72 cm2
AV Area mean vel: 1.59 cm2
AV Mean grad: 17 mmHg
AV Peak grad: 30.3 mmHg
AV Vena cont: 0.3 cm
Ao pk vel: 2.75 m/s
Area-P 1/2: 3.5 cm2
P 1/2 time: 447 ms
S' Lateral: 2.5 cm

## 2023-12-10 ENCOUNTER — Ambulatory Visit: Admitting: Podiatry

## 2024-01-02 ENCOUNTER — Telehealth: Payer: Self-pay | Admitting: Physician Assistant

## 2024-01-02 NOTE — Telephone Encounter (Signed)
 Faxed to provided number

## 2024-01-02 NOTE — Telephone Encounter (Signed)
 Patient called would like the name of her medication faxed to Dialysis center. Fax # [phone]

## 2024-01-19 ENCOUNTER — Ambulatory Visit

## 2024-01-28 ENCOUNTER — Ambulatory Visit

## 2024-01-28 VITALS — BP 120/58 | HR 99 | Temp 97.3°F | Ht 66.0 in | Wt 272.4 lb

## 2024-01-28 DIAGNOSIS — G4733 Obstructive sleep apnea (adult) (pediatric): Secondary | ICD-10-CM | POA: Diagnosis not present

## 2024-01-28 NOTE — Patient Instructions (Signed)
" °  VISIT SUMMARY: Today, we discussed your obstructive sleep apnea and evaluated your CPAP compliance and equipment. We also touched on your weight management efforts.  YOUR PLAN: -OBSTRUCTIVE SLEEP APNEA: Obstructive sleep apnea is a condition where your airway becomes blocked during sleep, causing breathing pauses. Your current CPAP machine is over ten years old, and we are unable to retrieve compliance data from it. We have ordered a new CPAP machine for you through [facility]. Please clean your CPAP tubing with soap and water and let it dry in the shower. We will have a follow-up appointment in 1-1.5 months after you receive your new CPAP machine to meet insurance requirements.  INSTRUCTIONS: Please schedule a follow-up appointment in 1-1.5 months after you receive your new CPAP machine.                      Contains text generated by Abridge.                                 Contains text generated by Abridge.   "

## 2024-01-28 NOTE — Progress Notes (Signed)
 "  Pulmonology Office Visit   Subjective:  Patient ID: Chelsey Lee, female    DOB: 1956-01-04  MRN: [id number]  Referred by: Theo Iha, MD  CC:  Chief Complaint  Patient presents with   Sleep Apnea    Restarted wearing CPAP since 11/07/2023.  Wearing CPAP nightly.    HPI Chelsey Lee is a 69 y.o. female non-smoker with Allergic rhinitis, GERD, DM2, hyperlipidemia, celiac disease ESRD on HD and OSA.  11/07/2023> advised to continue with CPAP.  Compliance data was not available during last visit.  Discussed the use of AI scribe software for clinical note transcription with the patient, who gave verbal consent to proceed.  Discussed the use of AI scribe software for clinical note transcription with the patient, who gave verbal consent to proceed.  History of Present Illness   Chelsey Lee is a 69 year old female with moderate obstructive sleep apnea who presents for evaluation of CPAP compliance and equipment update.  She has a history of moderate obstructive sleep apnea diagnosed in 2017 and has been using a CPAP machine with a pressure setting of 11 cm H2O since her diagnosis. Her current CPAP machine is over ten years old, and she is unable to retrieve compliance data from it. She uses a hybrid mask that fits under her nose and cleans the water chamber regularly. However, she has not cleaned the tubing recently. She has concerns about the mask sliding due to her oily skin.  Regarding weight management, she has been on Ozempic  for three weeks but has not noticed any weight loss. She is managing her weight with her primary care provider. She is on dialysis and has diabetes, which complicates her weight management efforts.      OSA history: NPSG 04/04/15 AHI 24.4/ hr, desaturation to 77%, Body weight 278 lbs  Was following Dr. Neysa. Has been on CPAP with pressure of 11 cm H2O found after titration study.   PAP download compliance data: no recent used.  Data  pulled October 17 to November 21, 2023.  CPAP pressure 11 with some leak.  Residual AHI 2.4.  Able to tolerate for 6 hours 28 minutes every day when used.  PRIOR TESTS and IMAGING: ECHO: Echo 2016: EF normal.  Left atrium mildly dilated.      05/11/2021   10:06 AM  Results of the Epworth flowsheet  Sitting and reading 1  Watching TV 1  Sitting, inactive in a public place (e.g. a theatre or a meeting) 0  As a passenger in a car for an hour without a break 0  Lying down to rest in the afternoon when circumstances permit 3  Sitting and talking to someone 0  Sitting quietly after a lunch without alcohol 2  In a car, while stopped for a few minutes in traffic 0  Total score 7    Allergies: Patient has no known allergies.  Current Outpatient Medications:    allopurinol  (ZYLOPRIM ) 100 MG tablet, Take 1 tablet (100 mg total) by mouth daily., Disp: 60 tablet, Rfl: 3   ammonium lactate  (AMLACTIN) 12 % lotion, Apply 1 Application topically as needed for dry skin., Disp: 400 g, Rfl: 0   atorvastatin  (LIPITOR) 40 MG tablet, Take 1 tablet (40 mg total) by mouth daily., Disp: 30 tablet, Rfl: 3   AURYXIA  1 GM 210 MG(Fe) tablet, Take 210-420 mg by mouth See admin instructions. 420 mg three times daily with meals, and 210 mg twice daily with snacks, Disp: ,  Rfl:    B Complex-C-Folic Acid  (DIALYVITE TABLET) TABS, Take 1 tablet by mouth daily., Disp: , Rfl:    buPROPion  (WELLBUTRIN  SR) 150 MG 12 hr tablet, Take 150 mg by mouth 2 (two) times daily., Disp: , Rfl:    carvedilol  (COREG ) 25 MG tablet, Take 25 mg by mouth 2 (two) times daily with a meal., Disp: , Rfl:    CHELATED MAGNESIUM  PO, Take 2 tablets by mouth every evening., Disp: , Rfl:    Continuous Blood Gluc Sensor (FREESTYLE LIBRE 2 SENSOR) MISC, , Disp: , Rfl:    escitalopram  (LEXAPRO ) 10 MG tablet, Take 10 mg by mouth daily., Disp: , Rfl:    FREESTYLE LITE test strip, SMARTSIG:Via Meter 2-3 Times Daily, Disp: , Rfl:    glucose blood (ONETOUCH  ULTRA) test strip, , Disp: , Rfl:    insulin  NPH-regular Human (70-30) 100 UNIT/ML injection, Inject 40-50 Units into the skin See admin instructions. Inject 50 units into the skin in the morning and 40 units at night, Disp: , Rfl:    isosorbide  mononitrate (IMDUR ) 30 MG 24 hr tablet, Take 30 mg by mouth daily., Disp: , Rfl:    LINZESS  145 MCG CAPS capsule, Take 145 mcg by mouth daily., Disp: , Rfl:    predniSONE  (DELTASONE ) 50 MG tablet, Take 1 tablet (50 mg total) by mouth daily with breakfast. Take until completed., Disp: 5 tablet, Rfl: 0 Past Medical History:  Diagnosis Date   Allergic rhinitis    Anemia    Arthritis    Celiac disease    Cerebrovascular disease    Diabetes mellitus    End stage renal disease (HCC)    Epidural lipomatosis    ESRD on HD    T Th Sa   GERD (gastroesophageal reflux disease)    Goiter    Gout    Heart murmur    Hyperlipemia    Hypertension    Osteoarthritis    Seasonal allergies    Sleep apnea    Past Surgical History:  Procedure Laterality Date   AV FISTULA PLACEMENT Left 04/10/2020   Procedure: LEFT ARM ARTERIOVENOUS (AV) FISTULA CREATION;  Surgeon: Harvey Carlin BRAVO, MD;  Location: [facility] OR;  Service: Vascular;  Laterality: Left;   CAPSULOTOMY  01/13/2012   Procedure: MINOR CAPSULOTOMY;  Surgeon: Debby BRAVO Sharalyn Mickey., MD;  Location: [facility] OR;  Service: Ophthalmology;  Laterality: Left;   DORSAL COMPARTMENT RELEASE  01/22/2007   rt    FISTULA SUPERFICIALIZATION Left 05/29/2020   Procedure: LEFT UPPER ARM FISTULA SUPERFICIALIZATION WITH LIGATION OF MULTIPLE SIDE BRANCHES;  Surgeon: Harvey Carlin BRAVO, MD;  Location: [facility] OR;  Service: Vascular;  Laterality: Left;   KNEE ARTHROSCOPY  02/21/2002   right and left   STERIOD INJECTION  08/01/2011   Procedure: STEROID INJECTION;  Surgeon: Lamar LULLA Leonor Mickey., MD;  Location: [facility];  Service: Orthopedics;  Laterality: Left;  cmc left thumb   TONSILLECTOMY     TOTAL KNEE ARTHROPLASTY  Left 02/08/2022   Procedure: LEFT TOTAL KNEE ARTHROPLASTY;  Surgeon: Harden Jerona LULLA, MD;  Location: [facility] OR;  Service: Orthopedics;  Laterality: Left;   TRIGGER FINGER RELEASE  01/22/2004   rt thumb   TRIGGER FINGER RELEASE  08/01/2011   Procedure: RELEASE TRIGGER FINGER/A-1 PULLEY;  Surgeon: Lamar LULLA Leonor Mickey., MD;  Location: [facility];  Service: Orthopedics;  Laterality: Left;  left thumb   YAG LASER APPLICATION  01/13/2012   Procedure: YAG LASER APPLICATION;  Surgeon: Debby FORBES Sharalyn Mickey., MD;  Location: [facility] OR;  Service: Ophthalmology;  Laterality: Left;   Family History  Problem Relation Age of Onset   BRCA 1/2 Neg Hx    Breast cancer Neg Hx    Social History   Socioeconomic History   Marital status: Single    Spouse name: Not on file   Number of children: Not on file   Years of education: Not on file   Highest education level: Not on file  Occupational History   Not on file  Tobacco Use   Smoking status: Never   Smokeless tobacco: Never  Vaping Use   Vaping status: Never Used  Substance and Sexual Activity   Alcohol use: No   Drug use: No   Sexual activity: Not on file  Other Topics Concern   Not on file  Social History Narrative   Not on file   Social Drivers of Health   Tobacco Use: Low Risk (01/28/2024)   Patient History    Smoking Tobacco Use: Never    Smokeless Tobacco Use: Never    Passive Exposure: Not on file  Financial Resource Strain: Not on file  Food Insecurity: No Food Insecurity (02/08/2022)   Hunger Vital Sign    Worried About Running Out of Food in the Last Year: Never true    Ran Out of Food in the Last Year: Never true  Transportation Needs: No Transportation Needs (02/08/2022)   PRAPARE - Administrator, Civil Service (Medical): No    Lack of Transportation (Non-Medical): No  Physical Activity: Not on file  Stress: Not on file  Social Connections: Not on file  Intimate Partner Violence: Not At Risk (02/08/2022)    Humiliation, Afraid, Rape, and Kick questionnaire    Fear of Current or Ex-Partner: No    Emotionally Abused: No    Physically Abused: No    Sexually Abused: No  Depression (PHQ2-9): Not on file  Alcohol Screen: Not on file  Housing: Low Risk (02/08/2022)   Housing    Last Housing Risk Score: 0  Utilities: Not At Risk (02/08/2022)   AHC Utilities    Threatened with loss of utilities: No  Health Literacy: Not on file       Objective:  BP (!) 120/58   Pulse 99   Temp (!) 97.3 F (36.3 C) (Temporal)   Ht 5' 6 (1.676 m)   Wt 272 lb 6.4 oz (123.6 kg)   SpO2 94% Comment: room air  BMI 43.97 kg/m  BMI Readings from Last 3 Encounters:  01/28/24 43.97 kg/m  11/07/23 43.71 kg/m  10/20/23 44.22 kg/m    Physical Exam: Physical Exam   ENT: Normal mucosa. PULMONARY: Lungs clear to auscultation bilaterally, no adventitious breath sounds. CARDIOVASCULAR: Regular rate and rhythm, S1 S2 normal, no murmurs. ABDOMEN: Abdomen soft, nontender. Bowel sounds are normal EXTREMITIES: No peripheral edema noted.       Diagnostic Review:  Last CBC Lab Results  Component Value Date   WBC 7.1 02/09/2023   HGB 12.4 02/09/2023   HCT 38.4 02/09/2023   MCV 101.1 (H) 02/09/2023   MCH 32.6 02/09/2023   RDW 13.8 02/09/2023   PLT 155 02/09/2023         Assessment & Plan:  Assessment and Plan    Obstructive sleep apnea Moderate obstructive sleep apnea managed with CPAP. Current machine from 2017, compliance data unavailable. Issues with mask fit due to oily skin. CPAP remains preferred treatment despite discussion  of mandibular advancement device. - Ordered new CPAP machine through [facility]. CPAP pressure 11.  - Instructed on cleaning CPAP tubing with soap and water, allowing it to dry in the shower. - Scheduled follow-up appointment in 1-1.5 months post-receipt of new CPAP machine to meet insurance requirements.     States that she is using CPAP.  However the machine is only showing  recording for October 2025.  Hence we will try to get new machine as the machine is not showing compliance data.  Morbid Obesity: - Follows PCP. Was on ozempic , discontinued due to GI side effects.  Has been resumed on Ozempic .  Continue to follow with primary care.   Return for 1 month after CPAP setup.   I personally spent a total of 30 minutes in the care of the patient today including preparing to see the patient, getting/reviewing separately obtained history, performing a medically appropriate exam/evaluation, counseling and educating, placing orders, documenting clinical information in the EHR, independently interpreting results, and communicating results.   Sammi Fredericks, MD  "

## 2024-02-03 ENCOUNTER — Ambulatory Visit: Admitting: Orthopedic Surgery

## 2024-02-04 ENCOUNTER — Telehealth: Payer: Self-pay | Admitting: *Deleted

## 2024-02-04 ENCOUNTER — Ambulatory Visit
Admission: RE | Admit: 2024-02-04 | Discharge: 2024-02-04 | Disposition: A | Discharge status: Home / Self Care | Source: Ambulatory Visit | Attending: Internal Medicine | Admitting: Internal Medicine

## 2024-02-04 DIAGNOSIS — Z1231 Encounter for screening mammogram for malignant neoplasm of breast: Secondary | ICD-10-CM

## 2024-02-04 NOTE — Telephone Encounter (Signed)
 Copied from CRM [id number]. Topic: Clinical - Prescription Issue >> Feb 02, 2024  8:46 AM Corean SAUNDERS wrote: Reason for CRM: Cresencio with [facility] states they received the patients new CPAP order however Kye pulled patient records in [employer] and it states patient only used CPAP two times in October.   Patient will need to be compliant for 30 days and then be seen by the provider again before insurance will pay for a new CPAP. Provider will also need to document compliance with machine in new appointment office note.    The current order has been cancelled states Kye.  -------------------------------------------------------------------------------------------------------------------------------------------- ATC patient x1.  Left a detailed message for patient to return call regarding new CPAP order that was placed and her CPAP compliance.  Will await return call.   She has a scheduled f/u with Dr. Pawar on 04/07/24.     Routing to Dr. Theodoro and Dr. Olena since Dr. Theodoro is currently unavailable.

## 2024-02-09 ENCOUNTER — Other Ambulatory Visit: Payer: Self-pay

## 2024-02-09 ENCOUNTER — Encounter: Payer: Self-pay | Admitting: Orthopedic Surgery

## 2024-02-09 ENCOUNTER — Ambulatory Visit: Admitting: Orthopedic Surgery

## 2024-02-09 DIAGNOSIS — M1711 Unilateral primary osteoarthritis, right knee: Secondary | ICD-10-CM

## 2024-02-09 DIAGNOSIS — M25561 Pain in right knee: Secondary | ICD-10-CM

## 2024-02-09 DIAGNOSIS — G8929 Other chronic pain: Secondary | ICD-10-CM

## 2024-02-09 DIAGNOSIS — B351 Tinea unguium: Secondary | ICD-10-CM | POA: Diagnosis not present

## 2024-02-09 NOTE — Progress Notes (Signed)
 "  Office Visit Note   Patient: Chelsey Lee           Date of Birth: [birth year]           MRN: [id number] Visit Date: 02/09/2024              Requested by: Theo Iha, MD [address] [place],  [place] [address] PCP: Theo Iha, MD  Chief Complaint  Patient presents with   Right Knee - Pain      HPI: Discussed the use of AI scribe software for clinical note transcription with the patient, who gave verbal consent to proceed.  History of Present Illness Chelsey Lee is a 69 year old female with bilateral knee osteoarthritis, status post left total knee arthroplasty, who presents for evaluation of severe right knee pain.  She has longstanding, severe right knee pain with significant functional limitation. She describes the pain as bone on bone and notes the right knee is not performing as well as the left. She has not experienced relief from prior intra-articular injections. She ambulates with a roller and walker. Radiographs demonstrate bone on bone contact at the medial and lateral joint lines, subluxation, subcondylar cyst sclerosis, periarticular bony spurs in all three compartments, and advanced vascular calcification. She expresses interest in right total knee arthroplasty next month.  She is status post left total knee arthroplasty, which is currently stable and asymptomatic. She describes the left knee as doing great with no current symptoms.  She is unable to safely trim her toenails due to thickened, discolored nails. She reports discomfort in the second toe, particularly when wearing socks, and increased weight-bearing on the third toe with a plantar callus.     Assessment & Plan: Visit Diagnoses:  1. Chronic pain of right knee   2. Unilateral primary osteoarthritis, right knee   3. Onychomycosis     Plan: Assessment and Plan Assessment & Plan Primary osteoarthritis of right knee Advanced primary osteoarthritis with bone-on-bone  contact, subluxation, and osteophytes. Failed prior injections, significant functional limitation. Total knee arthroplasty recommended. - Scheduled right total knee arthroplasty for February. - Office staff to confirm surgery date.  Status post left total knee arthroplasty Asymptomatic post left total knee arthroplasty.  Onychomycosis of toenails Onychomycosis with thickened, discolored nails on all toes, unable to trim due to condition and limitations.      Follow-Up Instructions: Return if symptoms worsen or fail to improve.   Ortho Exam  Patient is alert, oriented, no adenopathy, well-dressed, normal affect, normal respiratory effort. Physical Exam MEASUREMENTS: BMI- 43.9. EXTREMITIES: Thickened, discolored nails on all toes. Toenails trimmed without complication. MUSCULOSKELETAL: Right knee crepitus with range of motion. Subluxation of tibial-talar joint. Stable left total knee arthroplasty, asymptomatic. No cellulitis in knee.      Imaging: XR Knee 1-2 Views Right Result Date: 02/09/2024 2 view radiographs of the right knee shows bone-on-bone contact medial joint line as well as patellofemoral joint with osteophytic bone spurs subchondral cyst and sclerosis with advanced calcification of the popliteal vessels.  Patient has a stable left total knee arthroplasty.  No images are attached to the encounter.  Labs: Lab Results  Component Value Date   HGBA1C 7.3 (H) 02/08/2022   LABURIC 8.9 (H) 03/06/2020     Lab Results  Component Value Date   ALBUMIN 4.1 10/17/2023   ALBUMIN 2.8 (L) 02/12/2022   ALBUMIN 3.3 (L) 02/09/2022    No results found for: MG No results found for: [facility]  No results found for: PREALBUMIN    Latest Ref Rng & Units 02/09/2023   12:54 PM 02/12/2022    2:48 AM 02/09/2022    3:19 PM  CBC EXTENDED  WBC 4.0 - 10.5 K/uL 7.1  8.3  8.4   RBC 3.87 - 5.11 MIL/uL 3.80  2.96  3.19   Hemoglobin 12.0 - 15.0 g/dL 87.5  9.6  89.8   HCT 63.9 - 46.0  % 38.4  28.0  31.3   Platelets 150 - 400 K/uL 155  196  197   NEUT# 1.7 - 7.7 K/uL 4.5     Lymph# 0.7 - 4.0 K/uL 1.8        There is no height or weight on file to calculate BMI.  Orders:  Orders Placed This Encounter  Procedures   XR Knee 1-2 Views Right   No orders of the defined types were placed in this encounter.    Procedures: No procedures performed  Clinical Data: No additional findings.  ROS:  All other systems negative, except as noted in the HPI. Review of Systems  Objective: Vital Signs: There were no vitals taken for this visit.  Specialty Comments:  CLINICAL DATA:  Low back pain radiating into the left hip since 2022. No acute injury or prior relevant surgery. Previous injection.   EXAM: MRI LUMBAR SPINE WITHOUT CONTRAST   TECHNIQUE: Multiplanar, multisequence MR imaging of the lumbar spine was performed. No intravenous contrast was administered.   COMPARISON:  Lumbar MRI 02/04/2019   FINDINGS: Segmentation: Conventional anatomy assumed, with the last open disc space designated L5-S1.Concurrent with prior imaging.   Alignment:  Stable chronic grade 1 anterolisthesis at L4-5.   Vertebrae: No worrisome osseous lesion, acute fracture or pars defect. There are multilevel endplate degenerative changes in the thoracolumbar spine with evidence of interval interbody ankylosis at L4-5. Interval chronic appearing Schmorl's node in the superior endplate of L4. Ankylosing lower thoracic paraspinal osteophytes. Underlying congenitally short lumbar pedicles.   Conus medullaris: Extends to the L1 level and appears normal.   Paraspinal and other soft tissues: No significant paraspinal findings.   Disc levels:   Sagittal images demonstrate progressive multilevel spondylosis with ankylosing paraspinal osteophytes in the lower thoracic spine. There is mild right-sided foraminal narrowing at T10-11 and T11-12. No cord deformity.   L1-2: Mild disc bulging  with anterior osteophytes. No significant spinal stenosis or foraminal narrowing.   L2-3: Preserved disc height with mild disc bulging and facet hypertrophy. No significant spinal stenosis or foraminal narrowing.   L3-4: Progressive loss of disc height with progressive annular disc bulging, facet and ligamentous hypertrophy. Resulting increased central canal stenosis, now moderate. There is mild to moderate left greater than right lateral recess and foraminal narrowing.   L4-5: Chronic spondylosis with interval interbody ankylosis. Advanced facet disease with probable interfacetal ankylosis. Resulting chronic moderate multifactorial spinal stenosis and moderate lateral recess and foraminal narrowing bilaterally are similar to the previous study.   L5-S1: Chronic loss of disc height with annular disc bulging and endplate osteophytes. Moderate bilateral facet hypertrophy. Mild spinal stenosis and mild lateral recess narrowing bilaterally. There is chronic moderate to severe foraminal narrowing bilaterally.   IMPRESSION: 1. 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 Evidence for multilevel ankylosing osteophytes in the lower thoracic spine. 2. Progressive spondylosis at L3-4 with resulting moderate multifactorial spinal stenosis and mild to moderate left greater than right lateral recess and foraminal narrowing. 3.  Chronic spondylosis at L4-5 with interval interbody and probable interfacetal ankylosis. Resulting chronic moderate multifactorial spinal stenosis and moderate lateral recess and foraminal narrowing bilaterally. 4. Chronic mild multifactorial spinal stenosis and moderate to severe foraminal narrowing bilaterally at L5-S1. 5. No acute findings or clear explanation for the patient's symptoms.     Electronically Signed   By: Elsie Perone M.D.   On: 11/03/2022  14:51  PMFS History: Patient Active Problem List   Diagnosis Date Noted   Age-related osteoporosis without current pathological fracture 10/20/2023   OA (osteoarthritis) of knee 02/08/2022   Total knee replacement status, left 02/08/2022   Pain in left hand 06/29/2021   Cerebrovascular disease 05/11/2020   Gout 03/09/2020   Allergic rhinitis 01/25/2020   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   Colon cancer screening 01/25/2020   Gastroesophageal reflux disease without esophagitis 01/25/2020   Hyperlipidemia 01/25/2020   Morbid obesity (HCC) 01/25/2020   History of colonic polyps 01/25/2020   OSA (obstructive sleep apnea) 01/25/2020   Vitamin D deficiency 01/25/2020   Controlled type 2 diabetes mellitus without complication (HCC) 11/10/2019   Chronic kidney disease, stage 4 (severe) (HCC) 06/29/2019   Long term (current) use of insulin  (HCC) 06/29/2019   Epidural lipomatosis 03/02/2019   Spondylolisthesis of lumbar region 03/02/2019   Spinal stenosis of lumbar region with neurogenic claudication 03/02/2019   Left wrist tendinitis 09/07/2018   Unilateral primary osteoarthritis, left knee 04/03/2018   Celiac disease 08/21/2016   Abnormal thyroid function test 04/01/2016   Pain in thumb joint with movement of right hand 09/14/2015   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   Moderate major depression, single episode (HCC) 02/02/2010   Diabetic renal disease (HCC) 06/07/2009   Anemia 01/18/2009   Essential hypertension 01/18/2009   Past Medical History:  Diagnosis Date   Allergic rhinitis    Anemia    Arthritis    Celiac disease    Cerebrovascular disease    Diabetes mellitus    End stage renal disease (HCC)    Epidural lipomatosis    ESRD on HD    T Th Sa   GERD (gastroesophageal reflux disease)    Goiter    Gout    Heart murmur    Hyperlipemia    Hypertension    Osteoarthritis    Seasonal allergies    Sleep apnea     Family History  Problem Relation Age of Onset    BRCA 1/2 Neg Hx    Breast cancer Neg Hx     Past Surgical History:  Procedure Laterality Date   AV FISTULA PLACEMENT Left 04/10/2020   Procedure: LEFT ARM ARTERIOVENOUS (AV) FISTULA CREATION;  Surgeon: Harvey Carlin BRAVO, MD;  Location: [facility] OR;  Service: Vascular;  Laterality: Left;   CAPSULOTOMY  01/13/2012   Procedure: MINOR CAPSULOTOMY;  Surgeon: Debby BRAVO Sharalyn Mickey., MD;  Location: [facility] OR;  Service: Ophthalmology;  Laterality: Left;   DORSAL COMPARTMENT RELEASE  01/22/2007   rt    FISTULA SUPERFICIALIZATION Left 05/29/2020   Procedure: LEFT UPPER ARM FISTULA SUPERFICIALIZATION WITH LIGATION OF MULTIPLE SIDE BRANCHES;  Surgeon: Harvey Carlin BRAVO, MD;  Location: [facility] OR;  Service: Vascular;  Laterality: Left;   KNEE ARTHROSCOPY  02/21/2002   right and left   STERIOD INJECTION  08/01/2011   Procedure: STEROID INJECTION;  Surgeon: Lamar LULLA Leonor Mickey., MD;  Location: [facility];  Service: Orthopedics;  Laterality: Left;  cmc left thumb   TONSILLECTOMY     TOTAL KNEE ARTHROPLASTY Left  02/08/2022   Procedure: LEFT TOTAL KNEE ARTHROPLASTY;  Surgeon: Harden Jerona GAILS, MD;  Location: [facility] OR;  Service: Orthopedics;  Laterality: Left;   TRIGGER FINGER RELEASE  01/22/2004   rt thumb   TRIGGER FINGER RELEASE  08/01/2011   Procedure: RELEASE TRIGGER FINGER/A-1 PULLEY;  Surgeon: Lamar GAILS Leonor Mickey., MD;  Location: [facility];  Service: Orthopedics;  Laterality: Left;  left thumb   YAG LASER APPLICATION  01/13/2012   Procedure: YAG LASER APPLICATION;  Surgeon: Debby FORBES Sharalyn Mickey., MD;  Location[facility];  Service: Ophthalmology;  Laterality: Left;   Social History   Occupational History   Not on file  Tobacco Use   Smoking status: Never   Smokeless tobacco: Never  Vaping Use   Vaping status: Never Used  Substance and Sexual Activity   Alcohol use: No   Drug use: No   Sexual activity: Not on file         "

## 2024-02-09 NOTE — Telephone Encounter (Unsigned)
 Copied from CRM [id number]. Topic: Clinical - Prescription Issue >> Feb 02, 2024  8:46 AM Corean SAUNDERS wrote: Reason for CRM: Cresencio with [facility] states they received the patients new CPAP order however Kye pulled patient records in [employer] and it states patient only used CPAP two times in October.   Patient will need to be compliant for 30 days and then be seen by the provider again before insurance will pay for a new CPAP. Provider will also need to document compliance with machine in new appointment office note.    The current order has been cancelled states Kye. >> Feb 09, 2024 11:53 AM Rozanna MATSU wrote: Pt returning call to clinic about her CPAP machine.  >> Feb 06, 2024  9:38 AM Russell PARAS wrote: Pt is returning call from [facility], concerning her CPAP machine. Contacted CAL x3, no answer  Pt requested call back  CB#  [phone]

## 2024-02-09 NOTE — Telephone Encounter (Signed)
 Copied from CRM [id number]. Topic: Clinical - Prescription Issue >> Feb 02, 2024  8:46 AM Corean SAUNDERS wrote: Reason for CRM: Cresencio with [facility] states they received the patients new CPAP order however Kye pulled patient records in [employer] and it states patient only used CPAP two times in October.   Patient will need to be compliant for 30 days and then be seen by the provider again before insurance will pay for a new CPAP. Provider will also need to document compliance with machine in new appointment office note.    The current order has been cancelled states Kye. >> Feb 09, 2024 11:53 AM Rozanna MATSU wrote: Pt returning call to clinic about her CPAP machine.  >> Feb 06, 2024  9:38 AM Russell PARAS wrote: Pt is returning call from [facility], concerning her CPAP machine. Contacted CAL x3, no answer  Pt requested call back  CB#  [phone]  -------------------------------------------------------------------------------------------------------------------------------------------  I called the patient and explained that the DME company, [employer] would not process the order for the new CPAP machine until we had data showing her compliant with her CPAP machine for 30 days.  She states that when she saw him initially 11/07/23, she was not wearing her machine, however when she saw him last on 01/28/24, she had been wearing the machine consistently for the past 30 days.  When you pull her up in airview it shows no usage.  She says she does not have an SD card or a place to put a card in her machine.  I advised her I would call [employer] and she may need to take her machine in and have them pull the data off the machine, if that is the case, I will have [employer] call her to set that up.  She verbalized understanding.  I called [employer] and spoke with Redell, he is going to call her and arrange for her to come in to get the data off of her machine.  I asked that if she has the capability to put an SD card in there to please  do so while she is in the office.  He stated he would do so.  Nothing further needed.

## 2024-04-07 ENCOUNTER — Ambulatory Visit

## 2024-05-19 ENCOUNTER — Ambulatory Visit: Admitting: Physician Assistant
